# Patient Record
Sex: Female | Born: 1935 | Race: White | Hispanic: No | Marital: Married | Smoking: Never smoker
Health system: Southern US, Community
[De-identification: ages and names within clinical notes are randomized; demographics above are authoritative.]

## PROBLEM LIST (undated history)

## (undated) DIAGNOSIS — K648 Other hemorrhoids: Secondary | ICD-10-CM

## (undated) DIAGNOSIS — F32A Depression, unspecified: Secondary | ICD-10-CM

## (undated) DIAGNOSIS — K5790 Diverticulosis of intestine, part unspecified, without perforation or abscess without bleeding: Secondary | ICD-10-CM

## (undated) DIAGNOSIS — I639 Cerebral infarction, unspecified: Secondary | ICD-10-CM

## (undated) DIAGNOSIS — G4700 Insomnia, unspecified: Secondary | ICD-10-CM

## (undated) DIAGNOSIS — I10 Essential (primary) hypertension: Secondary | ICD-10-CM

## (undated) DIAGNOSIS — F419 Anxiety disorder, unspecified: Secondary | ICD-10-CM

## (undated) DIAGNOSIS — I38 Endocarditis, valve unspecified: Secondary | ICD-10-CM

## (undated) DIAGNOSIS — F329 Major depressive disorder, single episode, unspecified: Secondary | ICD-10-CM

## (undated) DIAGNOSIS — I76 Septic arterial embolism: Secondary | ICD-10-CM

## (undated) DIAGNOSIS — M1990 Unspecified osteoarthritis, unspecified site: Secondary | ICD-10-CM

## (undated) DIAGNOSIS — N289 Disorder of kidney and ureter, unspecified: Secondary | ICD-10-CM

## (undated) DIAGNOSIS — I2510 Atherosclerotic heart disease of native coronary artery without angina pectoris: Secondary | ICD-10-CM

## (undated) DIAGNOSIS — Z972 Presence of dental prosthetic device (complete) (partial): Secondary | ICD-10-CM

## (undated) DIAGNOSIS — M539 Dorsopathy, unspecified: Secondary | ICD-10-CM

## (undated) DIAGNOSIS — T7840XA Allergy, unspecified, initial encounter: Secondary | ICD-10-CM

## (undated) DIAGNOSIS — E785 Hyperlipidemia, unspecified: Secondary | ICD-10-CM

## (undated) DIAGNOSIS — E876 Hypokalemia: Secondary | ICD-10-CM

## (undated) DIAGNOSIS — R42 Dizziness and giddiness: Secondary | ICD-10-CM

## (undated) HISTORY — PX: OTHER SURGICAL HISTORY: SHX169

## (undated) HISTORY — DX: Depression, unspecified: F32.A

## (undated) HISTORY — DX: Diverticulosis of intestine, part unspecified, without perforation or abscess without bleeding: K57.90

## (undated) HISTORY — DX: Hypokalemia: E87.6

## (undated) HISTORY — DX: Allergy, unspecified, initial encounter: T78.40XA

## (undated) HISTORY — DX: Septic arterial embolism: I76

## (undated) HISTORY — DX: Dorsopathy, unspecified: M53.9

## (undated) HISTORY — DX: Unspecified osteoarthritis, unspecified site: M19.90

## (undated) HISTORY — DX: Major depressive disorder, single episode, unspecified: F32.9

## (undated) HISTORY — DX: Cerebral infarction, unspecified: I63.9

## (undated) HISTORY — DX: Dizziness and giddiness: R42

## (undated) HISTORY — DX: Anxiety disorder, unspecified: F41.9

## (undated) HISTORY — DX: Insomnia, unspecified: G47.00

## (undated) HISTORY — DX: Other hemorrhoids: K64.8

## (undated) HISTORY — DX: Disorder of kidney and ureter, unspecified: N28.9

## (undated) HISTORY — DX: Endocarditis, valve unspecified: I38

---

## 1977-02-19 HISTORY — PX: ABDOMINAL HYSTERECTOMY: SHX81

## 1999-09-29 ENCOUNTER — Encounter (INDEPENDENT_AMBULATORY_CARE_PROVIDER_SITE_OTHER): Payer: Self-pay | Admitting: Specialist

## 1999-09-29 ENCOUNTER — Observation Stay (HOSPITAL_COMMUNITY): Admission: RE | Admit: 1999-09-29 | Discharge: 1999-09-30 | Payer: Self-pay | Admitting: General Surgery

## 1999-09-29 HISTORY — PX: CHOLECYSTECTOMY: SHX55

## 2000-06-07 ENCOUNTER — Other Ambulatory Visit: Admission: RE | Admit: 2000-06-07 | Discharge: 2000-06-07 | Payer: Self-pay | Admitting: Obstetrics and Gynecology

## 2002-10-05 ENCOUNTER — Ambulatory Visit (HOSPITAL_COMMUNITY): Admission: RE | Admit: 2002-10-05 | Discharge: 2002-10-05 | Payer: Self-pay | Admitting: Gastroenterology

## 2008-02-05 ENCOUNTER — Encounter (INDEPENDENT_AMBULATORY_CARE_PROVIDER_SITE_OTHER): Payer: Self-pay | Admitting: Gastroenterology

## 2008-02-05 ENCOUNTER — Ambulatory Visit (HOSPITAL_COMMUNITY): Admission: RE | Admit: 2008-02-05 | Discharge: 2008-02-05 | Payer: Self-pay | Admitting: Gastroenterology

## 2010-07-04 NOTE — Op Note (Signed)
NAMEADNA, NOFZIGER NO.:  [id number]   MEDICAL RECORD NO.:  [id number]          PATIENT TYPE:  AMB   LOCATION:  ENDO                         FACILITY:  [facility]   PHYSICIAN:  Petra Kuba, M.D.    DATE OF BIRTH:  [birth year]   DATE OF PROCEDURE:  02/05/2008  DATE OF DISCHARGE:                               OPERATIVE REPORT   PROCEDURE:  Colonoscopy with biopsy.   INDICATIONS:  A patient with a family history of colon cancer due for  repeat screening.  Consent was signed after risks, benefits, methods,  options thoroughly discussed multiple times in the past.   MEDICINES USED PER ANESTHESIA:  Propofol 200 mg, fentanyl 100 mcg,  Versed 2 mg.   PROCEDURE IN DETAIL:  Rectal inspection is pertinent for external  hemorrhoids small digital exam was negative.  The video pediatric  colonoscope was inserted and with moderate difficulty due to a tortuous  sigmoid requiring abdominal pressure once through this area the rest of  the procedure went smoothly with just a little bit of abdominal pressure  was able to advance to the cecum.  On insertion a few small sigmoid  diverticula were seen but no other abnormalities.  Cecum was identified  by the appendiceal orifice and the ileocecal valve.  The scope was  inserted a short ways in the terminal ileum which was normal and photo  documentation was obtained.  Scope was slowly withdrawn.  Prep was  adequate.  There was some liquid stool that required washing and  suction.  On slow withdrawal through the colon at the proximal level of  the hepatic flexure a tiny to small probably hyperplastic appearing  polyp was seen and was cold biopsied x3.  Also in the mid transverse a  similar polyp was seen and cold biopsied x2 and put in the same  container.  The scope was withdrawn around the left side of the colon.  In the mid descending another small polyp was seen, again seemed  hyperplastic appearing.  Cold biopsies were obtained and  it was put in a  separate container.  Scope was further withdrawn, which confirmed the  sigmoid diverticula few and small as well as very tortuous sigmoid but  no other abnormalities.  Once back in the rectum anorectal pull-through  and retroflexion confirmed some small hemorrhoids.  Scope was  straightened and readvanced a short ways up the left side of the colon.  Air was suctioned, scope removed.  The patient tolerated the procedure  well.  There was no obvious immediate complication.   ENDOSCOPIC DIAGNOSES:  1. Internal-external small hemorrhoids.  2. Tortuous sigmoid with a few small diverticula.  3. Descending, transverse and hepatic flexures tiny to small polyps      cold biopsied, all hyperplastic appearing.  4. Otherwise within normal limits to the terminal ileum.   PLAN:  Will await pathology.  Probably recheck colon screening in 5  years.  Happy to see back p.r.n.  If she allows colon screening again  would use similar medicines.  ______________________________  Petra Kuba, M.D.     MEM/MEDQ  D:  02/05/2008  T:  02/06/2008  Job:  [id number]   cc:   Dr Earlene Plater  at [facility] in [place]

## 2010-07-07 NOTE — Op Note (Signed)
[place]  Patient:    Frances Mendoza, Frances Mendoza                    MRN: [id number] Proc. Date: 09/29/99 Adm. Date:  [id number] Attending:  Brandy Hale CC:         Dr. Darrel Hoover. [place], [facility], [facility]   Operative Report  PREOPERATIVE DIAGNOSIS:  Chronic cholecystitis with cholelithiasis.  POSTOPERATIVE DIAGNOSIS:  Chronic cholecystitis with cholelithiasis.  OPERATION PERFORMED:  Laparoscopic cholecystectomy.  SURGEON:  Angelia Mould. Derrell Lolling, M.D.  FIRST ASSISTANT:  Dr. Chevis Pretty.  OPERATIVE INDICATIONS:  This is a 75 year old white female, who has a 77-month history of intermittent episodes of severe epigastric pain radiating to the back.  These attacks of pain are exacerbated by eating.  Gallbladder ultrasound shows multiple gallstones.  Common bile duct measures 5 mm.  Liver function tests and serum amylase are normal.  She has been seen by a cardiologist and cleared for surgery.  She is brought to the operating electively for cholecystectomy.  OPERATIVE FINDINGS:  The gallbladder was somewhat large, filled with numerous gallstones and there was extensive adhesions to the gallbladder, suggesting prior inflammatory episodes.  The anatomy of the cystic duct, cystic artery and common bile duct were conventional.  The liver, stomach and duodenum appeared healthy.  The large intestine and small intestine were grossly normal to inspection.  The peritoneal surface looked normal.  OPERATIVE TECHNIQUE:  Following the induction of general endotracheal anesthesia, the patients abdomen was prepped and draped in a sterile fashion. Marcaine 0.5% with epinephrine was used as a local infiltration anesthetic. A transverse incision was made at the superior rim of the umbilicus.  The fascia was incised in the midline and the abdominal cavity entered under direct vision.  A 10 mm Hasson trocar was inserted and secured with a purse-string  suture of 0 Vicryl.  Pneumoperitoneum was created.  Video camera was inserted with visualization and findings as described above.  A 10 mm trocar was placed in the subxiphoid region and two 5 mm trocars placed in the right mid abdomen.  The gallbladder was identified and elevated.  Numerous adhesions were taken down carefully.  The anatomy was clearly identified.  We were ultimately able to retract the infundibulum of the gallbladder laterally to the right and dissect out the cystic duct and cystic artery and to create a window lateral to the liver and inferior to the gallbladder and superior to the cystic duct demonstrating all of the anatomy.  We clearly identified the junction of the cystic duct with the infundibulum of the gallbladder and clearly identified the junction of the cystic duct with the common bile duct. The cystic duct and cystic artery were then secured with metal clips and divided.  The gallbladder was then dissected from its bed with electrocautery. That dissection was uneventful.  The gallbladder was removed through the umbilical port.  The operative field was copiously irrigated with saline.  At the completion of the case, there was no bleeding and no bile leak whatsoever. The trocars were removed under direct vision and there was no bleeding from the trocar sites.  The pneumoperitoneum was released.  The fascia of the umbilicus was closed with 0 Vicryl sutures.  The skin incisions were closed with subcuticular sutures of 4-0 Vicryl and Steri-Strips.  Clean bandages were placed and the patient taken to the recovery room in stable condition. Estimated blood loss was about 10 cc.  Complications none.  Sponge, needle and instrument counts were correct. DD:  09/29/99 TD:  09/29/99 Job: [id number]

## 2010-07-07 NOTE — Op Note (Signed)
   NAME:  Frances Mendoza, Frances Mendoza                       ACCOUNT NO.:  [id number]   MEDICAL RECORD NO.:  [id number]                   PATIENT TYPE:  AMB   LOCATION:  ENDO                                 FACILITY:  [facility]   PHYSICIAN:  Petra Kuba, M.D.                 DATE OF BIRTH:  June 13, 1935   DATE OF PROCEDURE:  10/05/2002  DATE OF DISCHARGE:                                 OPERATIVE REPORT   PROCEDURE:  Colonoscopy.   INDICATION:  The patient with a family history of colon cancer due for  colonic screening.  Consent was sighed after risks, benefits, methods, and  options were thoroughly discussed in the past.   MEDICINES USED:  Demerol 90, Versed 9.   DESCRIPTION OF PROCEDURE:  Rectal inspection was pertinent for external  hemorrhoids.  Digital exam was negative.  Pediatric video adjustable  colonoscope was inserted and with some difficulty due to a tortuous sigmoid,  once rolling her onto her back and getting the loop out, we were able to  advance to the cecum fairly easily.  However, the initial sigmoid  advancement was difficult.  On insertion, some left-sided diverticula were  seen.  The cecum was identified by the appendical orifice and the ileocecal  valve.  No other abnormality was seen on insertion.  The prep was adequate.  There was some liquid stool that required washing and suctioning.  On slow  withdrawal through the colon, cecum, ascending, and transverse were normal.  As we advanced around the left side, the occasional diverticula were  confirmed but no polypoid lesions, masses, or other abnormalities were seen.  Anorectal pull-through in retroflexion in the rectum confirmed some small  hemorrhoids.  The scope was straightened and readvanced a short ways up the  left side of the colon.  Air was suctioned.  The scope was removed.  The  patient tolerated the procedure adequately.  There was no obvious immediate  complication.   ENDOSCOPIC DIAGNOSES:  1. Internal and  external hemorrhoids.  2. Tortuous sigmoid.  3. Left occasional diverticula.  4. Otherwise within normal limits to the cecum.   PLAN:  Happy to see back p.r.n.  Consider repeat screening in 5-10 years.  Might even consider virtual colonoscopy if available at that junction.  Otherwise, return care to the primary team for customary health care  maintenance to include yearly rectals and guaiacs.                                               Petra Kuba, M.D.    MEM/MEDQ  D:  10/05/2002  T:  10/05/2002  Job:  [id number]   cc:   Ennis Forts  [facility]

## 2010-10-07 ENCOUNTER — Encounter (HOSPITAL_COMMUNITY): Payer: Self-pay | Admitting: Radiology

## 2010-10-07 ENCOUNTER — Emergency Department (HOSPITAL_COMMUNITY): Payer: Medicare Other

## 2010-10-07 ENCOUNTER — Inpatient Hospital Stay (HOSPITAL_COMMUNITY)
Admission: EM | Admit: 2010-10-07 | Discharge: 2010-10-18 | DRG: 280 | Disposition: A | Discharge status: Rehabilitation Facility | Payer: Medicare Other | Attending: Internal Medicine | Admitting: Internal Medicine

## 2010-10-07 DIAGNOSIS — F411 Generalized anxiety disorder: Secondary | ICD-10-CM | POA: Diagnosis present

## 2010-10-07 DIAGNOSIS — R1310 Dysphagia, unspecified: Secondary | ICD-10-CM | POA: Diagnosis present

## 2010-10-07 DIAGNOSIS — I2510 Atherosclerotic heart disease of native coronary artery without angina pectoris: Principal | ICD-10-CM | POA: Diagnosis present

## 2010-10-07 DIAGNOSIS — F0630 Mood disorder due to known physiological condition, unspecified: Secondary | ICD-10-CM | POA: Diagnosis not present

## 2010-10-07 DIAGNOSIS — I050 Rheumatic mitral stenosis: Secondary | ICD-10-CM | POA: Diagnosis present

## 2010-10-07 DIAGNOSIS — I498 Other specified cardiac arrhythmias: Secondary | ICD-10-CM | POA: Diagnosis present

## 2010-10-07 DIAGNOSIS — E876 Hypokalemia: Secondary | ICD-10-CM | POA: Diagnosis present

## 2010-10-07 DIAGNOSIS — R7989 Other specified abnormal findings of blood chemistry: Secondary | ICD-10-CM

## 2010-10-07 DIAGNOSIS — I10 Essential (primary) hypertension: Secondary | ICD-10-CM | POA: Diagnosis present

## 2010-10-07 DIAGNOSIS — I214 Non-ST elevation (NSTEMI) myocardial infarction: Secondary | ICD-10-CM | POA: Diagnosis present

## 2010-10-07 DIAGNOSIS — G0491 Myelitis, unspecified: Secondary | ICD-10-CM | POA: Diagnosis present

## 2010-10-07 DIAGNOSIS — K648 Other hemorrhoids: Secondary | ICD-10-CM | POA: Diagnosis present

## 2010-10-07 DIAGNOSIS — I2690 Septic pulmonary embolism without acute cor pulmonale: Secondary | ICD-10-CM | POA: Diagnosis present

## 2010-10-07 DIAGNOSIS — R4182 Altered mental status, unspecified: Secondary | ICD-10-CM | POA: Diagnosis present

## 2010-10-07 DIAGNOSIS — J189 Pneumonia, unspecified organism: Secondary | ICD-10-CM | POA: Diagnosis not present

## 2010-10-07 DIAGNOSIS — R42 Dizziness and giddiness: Secondary | ICD-10-CM | POA: Diagnosis present

## 2010-10-07 DIAGNOSIS — IMO0002 Reserved for concepts with insufficient information to code with codable children: Secondary | ICD-10-CM | POA: Diagnosis not present

## 2010-10-07 DIAGNOSIS — E46 Unspecified protein-calorie malnutrition: Secondary | ICD-10-CM | POA: Diagnosis present

## 2010-10-07 DIAGNOSIS — I059 Rheumatic mitral valve disease, unspecified: Secondary | ICD-10-CM

## 2010-10-07 DIAGNOSIS — D72829 Elevated white blood cell count, unspecified: Secondary | ICD-10-CM | POA: Diagnosis present

## 2010-10-07 DIAGNOSIS — A419 Sepsis, unspecified organism: Secondary | ICD-10-CM | POA: Diagnosis present

## 2010-10-07 DIAGNOSIS — J9600 Acute respiratory failure, unspecified whether with hypoxia or hypercapnia: Secondary | ICD-10-CM | POA: Diagnosis not present

## 2010-10-07 DIAGNOSIS — R404 Transient alteration of awareness: Secondary | ICD-10-CM

## 2010-10-07 DIAGNOSIS — M1990 Unspecified osteoarthritis, unspecified site: Secondary | ICD-10-CM | POA: Diagnosis present

## 2010-10-07 DIAGNOSIS — Z6832 Body mass index (BMI) 32.0-32.9, adult: Secondary | ICD-10-CM

## 2010-10-07 DIAGNOSIS — R6521 Severe sepsis with septic shock: Secondary | ICD-10-CM | POA: Diagnosis present

## 2010-10-07 DIAGNOSIS — G0490 Encephalitis and encephalomyelitis, unspecified: Secondary | ICD-10-CM | POA: Diagnosis present

## 2010-10-07 DIAGNOSIS — K5730 Diverticulosis of large intestine without perforation or abscess without bleeding: Secondary | ICD-10-CM | POA: Diagnosis present

## 2010-10-07 HISTORY — DX: Essential (primary) hypertension: I10

## 2010-10-07 LAB — URINALYSIS, ROUTINE W REFLEX MICROSCOPIC
Bilirubin Urine: NEGATIVE
Glucose, UA: NEGATIVE mg/dL
Ketones, ur: 15 mg/dL — AB
Nitrite: NEGATIVE
Protein, ur: 30 mg/dL — AB
Specific Gravity, Urine: 1.016 (ref 1.005–1.030)
Urobilinogen, UA: 0.2 mg/dL (ref 0.0–1.0)

## 2010-10-07 LAB — DIFFERENTIAL
Basophils Absolute: 0 10*3/uL (ref 0.0–0.1)
Eosinophils Absolute: 0 10*3/uL (ref 0.0–0.7)
Eosinophils Relative: 0 % (ref 0–5)
Lymphocytes Relative: 9 % — ABNORMAL LOW (ref 12–46)

## 2010-10-07 LAB — BASIC METABOLIC PANEL
BUN: 10 mg/dL (ref 6–23)
CO2: 30 mEq/L (ref 19–32)
CO2: 28 mEq/L (ref 19–32)
Chloride: 97 mEq/L (ref 96–112)
Glucose, Bld: 138 mg/dL — ABNORMAL HIGH (ref 70–99)
Glucose, Bld: 119 mg/dL — ABNORMAL HIGH (ref 70–99)
Potassium: 2.8 mEq/L — ABNORMAL LOW (ref 3.5–5.1)
Potassium: 2.6 mEq/L — CL (ref 3.5–5.1)
Sodium: 135 mEq/L (ref 135–145)

## 2010-10-07 LAB — GLUCOSE, CAPILLARY: Glucose-Capillary: 121 mg/dL — ABNORMAL HIGH (ref 70–99)

## 2010-10-07 LAB — CK TOTAL AND CKMB (NOT AT ARMC)
CK, MB: 15.1 ng/mL (ref 0.3–4.0)
Total CK: 150 U/L (ref 7–177)
Total CK: 186 U/L — ABNORMAL HIGH (ref 7–177)

## 2010-10-07 LAB — CBC
Platelets: 310 10*3/uL (ref 150–400)
RDW: 13.1 % (ref 11.5–15.5)
WBC: 15.5 10*3/uL — ABNORMAL HIGH (ref 4.0–10.5)

## 2010-10-07 LAB — TROPONIN I: Troponin I: 3.44 ng/mL (ref <0.30)

## 2010-10-07 LAB — POCT I-STAT TROPONIN I: Troponin i, poc: 2.15 ng/mL (ref 0.00–0.08)

## 2010-10-07 LAB — HEPARIN LEVEL (UNFRACTIONATED): Heparin Unfractionated: <0.1 IU/mL — ABNORMAL LOW (ref 0.30–0.70)

## 2010-10-07 MED ORDER — IOHEXOL 300 MG/ML  SOLN
100.0000 mL | Freq: Once | INTRAMUSCULAR | Status: AC | PRN
  Administered 2010-10-07: 100 mL via INTRAVENOUS

## 2010-10-07 NOTE — Consult Note (Signed)
NAME:  Frances Mendoza, Frances Mendoza NO.:  [id number]  MEDICAL RECORD NO.:  [id number]  LOCATION:  [facility]                         FACILITY:  [facility]  PHYSICIAN:  Isidor Holts, M.D.  DATE OF BIRTH:  06-Oct-1935  DATE OF CONSULTATION:  10/07/2010 DATE OF DISCHARGE:                                CONSULTATION   PRIMARY MD:  Dr. Earlene Plater, [facility] medical practice, [place], [place].  REQUESTING MD:  Dr. Elease Hashimoto, cardiologist.  REASON FOR CONSULTATION:  Altered mental status/elevated white cell count.  HISTORY OF PRESENT ILLNESS:  This is a 75 year old female.  History is obtained from the patient's spouse and daughter, who were present in the emergency department, as the patient is quite confused and disoriented. Apparently, she was quite well until 7 a.m. on October 07, 2010 when she woke up complaining of feeling unwell, was nauseated and vomited a few times with only small amount of bilious liquid. She heaved a lot, however, and complained of headache.  She also appear quite disoriented. Denied abdominal pain, diarrhea, or antecedent respiratory tract symptoms or fever or chills.  She subsequently, developed substernal chest pain.  Her spouse at about 10 a.m, called her son who is an EMT and he called emergency medical services, who then arrived and brought her to the emergency department.  She was evaluated by the Cardiology team, was diagnosed with NSTEMI.  We are, however, requested to consult for altered mental status and leukocytosis.  PAST MEDICAL HISTORY: 1. Diverticulosis. 2. Internal hemorrhoids. 3. Status post colonoscopy in February 2009 which demonstrated     multiple polyps which were biopsied at this time. 4. Status post laparoscopic cholecystectomy on September 29, 1999 for     chronic cholecystitis/cholelithiasis. 5. Hypertension. 6. Anxiety. 7. Vertigo. 8. Osteoarthritis.  ALLERGIES:  These are multiple and include CODEINE, PENICILLIN, PROCARDIA,  and PROLACTIN.  MEDICATION HISTORY:  The patient is on; 1. Chlorthalidone. 2. Diazepam 3. Meloxicam. 4. Premarin.  Note, precise dosages are not known at this time and this medication list will be updated by clinical pharmacologist, as further information is obtained.  REVIEW OF SYSTEMS:  As per HPI and chief complaint, otherwise negative.  SOCIAL HISTORY:  The patient is married.  She is retired.  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  She has three offspring.  She is a nonsmoker, nondrinker.  Has no history of drug abuse.  FAMILY HISTORY:  The patient's father died status post MI at age 15 years.  All her sisters are heart patient.  Her mother died status post CVA at age 69 years.  PHYSICAL EXAMINATION:  VITAL SIGNS:  Temperature 98.9, pulse 106 per minute and regular, respiratory rate 14, BP 170/87 mmHg, rechecked 169/92 mmHg after the nitroglycerin given the emergency department. Pulse oximetry 96% on 2 liters of oxygen. GENERAL:  The patient did not seem to be in obvious acute distress at the time of this evaluation.  She was somewhat somnolent, even after  Flumazenil, administered by ED MD,  but easily arousable.   Follows instructions albeit at some urging, not communicative. HEENT:  No clinical pallor or jaundice.  No  conjunctival injection. Hydration status appears fair. NECK:  Supple.  JVP not seen. CHEST:  Clear to auscultation.  No wheezes or crackles. HEART:  Sounds are heard and normal, mildly tachycardic, regular, no murmurs. ABDOMEN:  Morbidly obese, soft, nontender.  Unable to palpate organs. Bowel sounds are normal. LOWER EXTREMITY:  No pitting edema.  Palpable peripheral pulses. MUSCULOSKELETAL SYSTEM:  The patient does have osteoarthritic changes. CENTRAL NERVOUS SYSTEM:  The patient is moving all, however, further examination is not feasible secondary to lack of cooperation.  INVESTIGATIONS:  CBC, WBC 15.5,  neutrophils 88%, hemoglobin 14.8, hematocrit 42.0, platelets 310.  Electrolytes, sodium 135, potassium 2.8, chloride 93, CO2 of 30, BUN 10, creatinine less than 0.47, glucose 138, troponin-I at point of care 2.17 and regular troponin-I 1.20.  CBG 121.  Urinalysis is negative.  Chest x-ray October 07, 2010, shows no active disease.  Head CT scan October 07, 2010, is negative for acute pathology.  ASSESSMENT AND PLAN AND RECOMMENDATIONS: 1. Altered mental status.  This appears to be delirium and possibly of     toxic/metabolic etiology.  The patient has no obvious focus of     infection at the present time.  Chest x-ray is negative.     Urinalysis is negative, hydration status appears fair.  The patient is     afebrile.  However, she does have a neutrophil leukocytosis, which     although it may possibly be due to NSTEMI, is concerning for     infective etiology.  We shall therefore send blood cultures and if     the patient spikes a temperature, we will initiate Broad-spectrum antibiotic     therapy.  In view of altered mental status, we shall arrange a     brain MRI to rule out possible CVA, although there is a paucity of     focal findings at this time, neurologically.  2. Hypokalemia.  The patient has a significant hypokalemia, likely     secondary to vomiting.  She has no arrhythmia on telemetric     monitoring.  This will be repleted as appropriate.  As a matter of     fact, the cardiology team has already addressed this.  3. Vomiting.  Query secondary to acute gastritis versus acute MI.  We     shall utilize p.r.n. antiemetics, proton pump inhibitor, and for     completeness check lipase to rule out acute pancreatitis.  4. Non-ST elevation myocardial infarction, this will be managed by the     primary team.  5. Hypertension.  This is uncontrolled and quite significant.  The     primary team is likely already addressing this,     however, the patient may benefit from beta-blocker  treatment     provided no contraindications exist, otherwise, ACE inhibitor,     or hydralazine may be utilized.  Thank you for the consultation.  We will follow with you.     Isidor Holts, M.D.     CO/MEDQ  D:  10/07/2010  T:  10/07/2010  Job:  [id number]  cc:   Vesta Mixer, M.D. Dr. Earlene Plater.  Electronically Signed by Isidor Holts M.D. on 10/07/2010 08:52:15 PM

## 2010-10-08 ENCOUNTER — Inpatient Hospital Stay (HOSPITAL_COMMUNITY): Payer: Medicare Other

## 2010-10-08 DIAGNOSIS — R4182 Altered mental status, unspecified: Secondary | ICD-10-CM

## 2010-10-08 DIAGNOSIS — J9600 Acute respiratory failure, unspecified whether with hypoxia or hypercapnia: Secondary | ICD-10-CM

## 2010-10-08 DIAGNOSIS — G0490 Encephalitis and encephalomyelitis, unspecified: Secondary | ICD-10-CM

## 2010-10-08 DIAGNOSIS — G0491 Myelitis, unspecified: Secondary | ICD-10-CM

## 2010-10-08 DIAGNOSIS — A419 Sepsis, unspecified organism: Secondary | ICD-10-CM

## 2010-10-08 DIAGNOSIS — R579 Shock, unspecified: Secondary | ICD-10-CM

## 2010-10-08 LAB — BASIC METABOLIC PANEL
CO2: 25 mEq/L (ref 19–32)
Chloride: 92 mEq/L — ABNORMAL LOW (ref 96–112)
Sodium: 125 mEq/L — ABNORMAL LOW (ref 135–145)

## 2010-10-08 LAB — BASIC METABOLIC PANEL WITH GFR
BUN: 7 mg/dL (ref 6–23)
CO2: 26 meq/L (ref 19–32)
Calcium: 7.9 mg/dL — ABNORMAL LOW (ref 8.4–10.5)
Chloride: 93 meq/L — ABNORMAL LOW (ref 96–112)
Creatinine, Ser: <0.47 mg/dL — ABNORMAL LOW (ref 0.50–1.10)
Glucose, Bld: 145 mg/dL — ABNORMAL HIGH (ref 70–99)
Potassium: 2.6 meq/L — CL (ref 3.5–5.1)
Sodium: 130 meq/L — ABNORMAL LOW (ref 135–145)

## 2010-10-08 LAB — CBC
HCT: 33.8 % — ABNORMAL LOW (ref 36.0–46.0)
Hemoglobin: 11.8 g/dL — ABNORMAL LOW (ref 12.0–15.0)
MCH: 31.4 pg (ref 26.0–34.0)
MCHC: 34.9 g/dL (ref 30.0–36.0)
MCV: 89.9 fL (ref 78.0–100.0)
Platelets: 259 10*3/uL (ref 150–400)
RBC: 3.76 MIL/uL — ABNORMAL LOW (ref 3.87–5.11)
RDW: 13 % (ref 11.5–15.5)
WBC: 14.5 10*3/uL — ABNORMAL HIGH (ref 4.0–10.5)

## 2010-10-08 LAB — HEPARIN LEVEL (UNFRACTIONATED): Heparin Unfractionated: <0.1 IU/mL — ABNORMAL LOW (ref 0.30–0.70)

## 2010-10-08 LAB — URINE DRUGS OF ABUSE SCREEN W ALC, ROUTINE (REF LAB)
Amphetamine Screen, Ur: NEGATIVE
Barbiturate Quant, Ur: NEGATIVE
Benzodiazepines.: POSITIVE — AB
Cocaine Metabolites: NEGATIVE
Creatinine,U: 44.5 mg/dL
Ethyl Alcohol: <10 mg/dL
Marijuana Metabolite: NEGATIVE
Methadone: NEGATIVE
Opiate Screen, Urine: NEGATIVE
Phencyclidine (PCP): NEGATIVE
Propoxyphene: NEGATIVE

## 2010-10-08 LAB — DIFFERENTIAL
Basophils Relative: 0 % (ref 0–1)
Eosinophils Absolute: 0 10*3/uL (ref 0.0–0.7)
Lymphs Abs: 3.3 10*3/uL (ref 0.7–4.0)
Monocytes Absolute: 1.3 10*3/uL — ABNORMAL HIGH (ref 0.1–1.0)
Monocytes Relative: 9 % (ref 3–12)
Neutro Abs: 9.9 10*3/uL — ABNORMAL HIGH (ref 1.7–7.7)

## 2010-10-08 LAB — CK TOTAL AND CKMB (NOT AT ARMC)
CK, MB: 9 ng/mL (ref 0.3–4.0)
Relative Index: 3.2 — ABNORMAL HIGH (ref 0.0–2.5)
Total CK: 279 U/L — ABNORMAL HIGH (ref 7–177)

## 2010-10-08 LAB — MRSA PCR SCREENING: MRSA by PCR: NEGATIVE

## 2010-10-08 LAB — PHOSPHORUS
Phosphorus: 1.3 mg/dL — ABNORMAL LOW (ref 2.3–4.6)
Phosphorus: 1.2 mg/dL — ABNORMAL LOW (ref 2.3–4.6)

## 2010-10-08 LAB — TROPONIN I: Troponin I: 2.52 ng/mL (ref <0.30)

## 2010-10-08 LAB — POCT I-STAT 3, ART BLOOD GAS (G3+)
Acid-Base Excess: 1 mmol/L (ref 0.0–2.0)
Bicarbonate: 25.3 mEq/L — ABNORMAL HIGH (ref 20.0–24.0)
TCO2: 26 mmol/L (ref 0–100)

## 2010-10-08 LAB — MAGNESIUM
Magnesium: 1.5 mg/dL (ref 1.5–2.5)
Magnesium: 2 mg/dL (ref 1.5–2.5)

## 2010-10-08 LAB — URINE CULTURE

## 2010-10-08 MED ORDER — GADOBENATE DIMEGLUMINE 529 MG/ML IV SOLN
15.0000 mL | Freq: Once | INTRAVENOUS | Status: AC | PRN
  Administered 2010-10-08: 15 mL via INTRAVENOUS

## 2010-10-09 ENCOUNTER — Inpatient Hospital Stay (HOSPITAL_COMMUNITY): Payer: Medicare Other

## 2010-10-09 DIAGNOSIS — G0490 Encephalitis and encephalomyelitis, unspecified: Secondary | ICD-10-CM

## 2010-10-09 DIAGNOSIS — G0491 Myelitis, unspecified: Secondary | ICD-10-CM

## 2010-10-09 LAB — BASIC METABOLIC PANEL
BUN: 6 mg/dL (ref 6–23)
Calcium: 7.3 mg/dL — ABNORMAL LOW (ref 8.4–10.5)
Chloride: 93 mEq/L — ABNORMAL LOW (ref 96–112)
Chloride: 102 mEq/L (ref 96–112)
Creatinine, Ser: <0.47 mg/dL — ABNORMAL LOW (ref 0.50–1.10)
Potassium: 3.2 mEq/L — ABNORMAL LOW (ref 3.5–5.1)
Sodium: 126 mEq/L — ABNORMAL LOW (ref 135–145)

## 2010-10-09 LAB — ROCKY MTN SPOTTED FVR AB, IGM-BLOOD: RMSF IgM: 0.35 IV (ref 0.00–0.89)

## 2010-10-09 LAB — MAGNESIUM
Magnesium: 1.8 mg/dL (ref 1.5–2.5)
Magnesium: 2 mg/dL (ref 1.5–2.5)

## 2010-10-09 LAB — POCT I-STAT 3, ART BLOOD GAS (G3+)
Patient temperature: 99.9
pCO2 arterial: 34.9 mmHg — ABNORMAL LOW (ref 35.0–45.0)
pO2, Arterial: 89 mmHg (ref 80.0–100.0)

## 2010-10-09 LAB — CBC
HCT: 29.2 % — ABNORMAL LOW (ref 36.0–46.0)
RDW: 13.1 % (ref 11.5–15.5)
WBC: 10.1 10*3/uL (ref 4.0–10.5)

## 2010-10-09 LAB — B. BURGDORFI ANTIBODIES: B burgdorferi Ab IgG+IgM: 0.12 {ISR}

## 2010-10-09 LAB — PHOSPHORUS: Phosphorus: 2 mg/dL — ABNORMAL LOW (ref 2.3–4.6)

## 2010-10-10 ENCOUNTER — Inpatient Hospital Stay (HOSPITAL_COMMUNITY): Payer: Medicare Other

## 2010-10-10 DIAGNOSIS — R579 Shock, unspecified: Secondary | ICD-10-CM

## 2010-10-10 DIAGNOSIS — A419 Sepsis, unspecified organism: Secondary | ICD-10-CM

## 2010-10-10 DIAGNOSIS — I059 Rheumatic mitral valve disease, unspecified: Secondary | ICD-10-CM

## 2010-10-10 DIAGNOSIS — I339 Acute and subacute endocarditis, unspecified: Secondary | ICD-10-CM

## 2010-10-10 DIAGNOSIS — J9600 Acute respiratory failure, unspecified whether with hypoxia or hypercapnia: Secondary | ICD-10-CM

## 2010-10-10 HISTORY — PX: TEE WITHOUT CARDIOVERSION: SHX5443

## 2010-10-10 LAB — BASIC METABOLIC PANEL
BUN: 6 mg/dL (ref 6–23)
CO2: 28 mEq/L (ref 19–32)
CO2: 26 mEq/L (ref 19–32)
Calcium: 7.5 mg/dL — ABNORMAL LOW (ref 8.4–10.5)
Calcium: 7.6 mg/dL — ABNORMAL LOW (ref 8.4–10.5)
Creatinine, Ser: <0.47 mg/dL — ABNORMAL LOW (ref 0.50–1.10)
GFR calc non Af Amer: >60 mL/min (ref >60)
Sodium: 139 mEq/L (ref 135–145)

## 2010-10-10 LAB — BLOOD GAS, ARTERIAL
Bicarbonate: 25.2 mEq/L — ABNORMAL HIGH (ref 20.0–24.0)
TCO2: 26.3 mmol/L (ref 0–100)
pCO2 arterial: 36.1 mmHg (ref 35.0–45.0)
pH, Arterial: 7.458 — ABNORMAL HIGH (ref 7.350–7.400)
pO2, Arterial: 77.8 mmHg — ABNORMAL LOW (ref 80.0–100.0)

## 2010-10-10 LAB — URINALYSIS, ROUTINE W REFLEX MICROSCOPIC
Nitrite: NEGATIVE
Specific Gravity, Urine: 1.014 (ref 1.005–1.030)
pH: 5.5 (ref 5.0–8.0)

## 2010-10-10 LAB — BODY FLUID CELL COUNT WITH DIFFERENTIAL
Eos, Fluid: 2 %
Monocyte-Macrophage-Serous Fluid: 9 % — ABNORMAL LOW (ref 50–90)
Other Cells, Fluid: 0 %

## 2010-10-10 LAB — GLUCOSE, CAPILLARY: Glucose-Capillary: 112 mg/dL — ABNORMAL HIGH (ref 70–99)

## 2010-10-10 LAB — CBC
MCH: 31.1 pg (ref 26.0–34.0)
Platelets: 214 10*3/uL (ref 150–400)
RBC: 3.22 MIL/uL — ABNORMAL LOW (ref 3.87–5.11)

## 2010-10-10 LAB — MAGNESIUM
Magnesium: 2.2 mg/dL (ref 1.5–2.5)
Magnesium: 2.1 mg/dL (ref 1.5–2.5)

## 2010-10-10 LAB — PHOSPHORUS: Phosphorus: 2.3 mg/dL (ref 2.3–4.6)

## 2010-10-10 LAB — URINE MICROSCOPIC-ADD ON

## 2010-10-11 ENCOUNTER — Inpatient Hospital Stay (HOSPITAL_COMMUNITY): Payer: Medicare Other

## 2010-10-11 LAB — BLOOD GAS, ARTERIAL
Bicarbonate: 24.3 mEq/L — ABNORMAL HIGH (ref 20.0–24.0)
Drawn by: 30599
PEEP: 5 cmH2O
PEEP: 5 cmH2O
Patient temperature: 98.6
Pressure support: 5 cmH2O
TCO2: 25.5 mmol/L (ref 0–100)
pCO2 arterial: 38.3 mmHg (ref 35.0–45.0)
pCO2 arterial: 36.4 mmHg (ref 35.0–45.0)
pH, Arterial: 7.419 — ABNORMAL HIGH (ref 7.350–7.400)
pH, Arterial: 7.447 — ABNORMAL HIGH (ref 7.350–7.400)
pO2, Arterial: 77.8 mmHg — ABNORMAL LOW (ref 80.0–100.0)
pO2, Arterial: 73 mmHg — ABNORMAL LOW (ref 80.0–100.0)

## 2010-10-11 LAB — BASIC METABOLIC PANEL
CO2: 25 mEq/L (ref 19–32)
Calcium: 6.9 mg/dL — ABNORMAL LOW (ref 8.4–10.5)
Creatinine, Ser: <0.47 mg/dL — ABNORMAL LOW (ref 0.50–1.10)
Sodium: 137 mEq/L (ref 135–145)

## 2010-10-11 LAB — CBC
MCH: 31.3 pg (ref 26.0–34.0)
MCHC: 33.6 g/dL (ref 30.0–36.0)
MCV: 93.3 fL (ref 78.0–100.0)
Platelets: 241 10*3/uL (ref 150–400)
RBC: 3.29 MIL/uL — ABNORMAL LOW (ref 3.87–5.11)
RDW: 13.5 % (ref 11.5–15.5)

## 2010-10-11 LAB — BENZODIAZEPINE, QUANTITATIVE, URINE
Flurazepam GC/MS Conf: NEGATIVE NG/ML
Nordiazepam GC/MS Conf: NEGATIVE NG/ML
Oxazepam GC/MS Conf: 362 NG/ML — ABNORMAL HIGH
Temazepam GC/MS Conf: 316 NG/ML — ABNORMAL HIGH

## 2010-10-11 LAB — MAGNESIUM: Magnesium: 1.9 mg/dL (ref 1.5–2.5)

## 2010-10-11 LAB — GLUCOSE, CAPILLARY
Glucose-Capillary: 142 mg/dL — ABNORMAL HIGH (ref 70–99)
Glucose-Capillary: 150 mg/dL — ABNORMAL HIGH (ref 70–99)
Glucose-Capillary: 139 mg/dL — ABNORMAL HIGH (ref 70–99)
Glucose-Capillary: 161 mg/dL — ABNORMAL HIGH (ref 70–99)

## 2010-10-11 LAB — PATHOLOGIST SMEAR REVIEW

## 2010-10-11 LAB — PHOSPHORUS: Phosphorus: 1.8 mg/dL — ABNORMAL LOW (ref 2.3–4.6)

## 2010-10-12 ENCOUNTER — Inpatient Hospital Stay (HOSPITAL_COMMUNITY): Payer: Medicare Other

## 2010-10-12 DIAGNOSIS — A419 Sepsis, unspecified organism: Secondary | ICD-10-CM

## 2010-10-12 DIAGNOSIS — J9600 Acute respiratory failure, unspecified whether with hypoxia or hypercapnia: Secondary | ICD-10-CM

## 2010-10-12 DIAGNOSIS — R579 Shock, unspecified: Secondary | ICD-10-CM

## 2010-10-12 LAB — CBC
Hemoglobin: 12.1 g/dL (ref 12.0–15.0)
MCH: 30.9 pg (ref 26.0–34.0)
MCV: 92.1 fL (ref 78.0–100.0)
RBC: 3.91 MIL/uL (ref 3.87–5.11)
WBC: 11.9 10*3/uL — ABNORMAL HIGH (ref 4.0–10.5)

## 2010-10-12 LAB — BASIC METABOLIC PANEL
CO2: 31 mEq/L (ref 19–32)
Calcium: 9.8 mg/dL (ref 8.4–10.5)
Chloride: 101 mEq/L (ref 96–112)
Creatinine, Ser: <0.47 mg/dL — ABNORMAL LOW (ref 0.50–1.10)
Glucose, Bld: 129 mg/dL — ABNORMAL HIGH (ref 70–99)
Sodium: 139 mEq/L (ref 135–145)

## 2010-10-12 LAB — GLUCOSE, CAPILLARY
Glucose-Capillary: 117 mg/dL — ABNORMAL HIGH (ref 70–99)
Glucose-Capillary: 139 mg/dL — ABNORMAL HIGH (ref 70–99)
Glucose-Capillary: 155 mg/dL — ABNORMAL HIGH (ref 70–99)

## 2010-10-12 LAB — CULTURE, BAL-QUANTITATIVE W GRAM STAIN: Colony Count: 100

## 2010-10-12 LAB — PHOSPHORUS: Phosphorus: 3.3 mg/dL (ref 2.3–4.6)

## 2010-10-12 LAB — MAGNESIUM: Magnesium: 2.2 mg/dL (ref 1.5–2.5)

## 2010-10-13 ENCOUNTER — Inpatient Hospital Stay (HOSPITAL_COMMUNITY): Payer: Medicare Other

## 2010-10-13 DIAGNOSIS — G0491 Myelitis, unspecified: Secondary | ICD-10-CM

## 2010-10-13 DIAGNOSIS — G0490 Encephalitis and encephalomyelitis, unspecified: Secondary | ICD-10-CM

## 2010-10-13 LAB — GLUCOSE, CAPILLARY
Glucose-Capillary: 153 mg/dL — ABNORMAL HIGH (ref 70–99)
Glucose-Capillary: 135 mg/dL — ABNORMAL HIGH (ref 70–99)
Glucose-Capillary: 116 mg/dL — ABNORMAL HIGH (ref 70–99)

## 2010-10-13 LAB — CBC
HCT: 39.5 % (ref 36.0–46.0)
Hemoglobin: 13.6 g/dL (ref 12.0–15.0)
MCHC: 34.4 g/dL (ref 30.0–36.0)
RBC: 4.35 MIL/uL (ref 3.87–5.11)

## 2010-10-13 LAB — CULTURE, BLOOD (ROUTINE X 2)
Culture  Setup Time: 201208182017
Culture: NO GROWTH

## 2010-10-13 NOTE — Consult Note (Signed)
NAMEMarland Kitchen  Frances Mendoza, Frances Mendoza NO.:  [id number]  MEDICAL RECORD NO.:  [id number]  LOCATION:  2309                         FACILITY:  [facility]  PHYSICIAN:  Sheliah Plane, MD    DATE OF BIRTH:  1935-03-28  DATE OF CONSULTATION:  10/10/2010 DATE OF DISCHARGE:                                CONSULTATION   REQUESTING PHYSICIAN:  Dr. Molli Knock.  REASON FOR CONSULTATION:  Question suitability for mitral valve repair replacement.  HISTORY OF PRESENT ILLNESS:  The patient is a 75 year old female who has had several weeks of increasing fatigue and weakness but then on October 07, 2010, was brought to the emergency room after acute onset of confusion, nausea, and vomiting.  She was noted to have positive cardiac enzymes.  Previously, has a history of hypertension.  Since admission, she has been on a ventilator with evidence of embolic stroke.  A TEE was performed today with some death and calcification of the mitral annulus, thickening of the posterior mitral leaflet with very mild MR.  There is no definite mobile vegetations.  She is currently now hemodynamically stable, sedated on ventilator.  PAST MEDICAL HISTORY:  Significant for history of hypertension.  Previous surgery include cholecystectomy, hysterectomy, and "bladder tuck."  SOCIAL AND FAMILY HISTORY:  The patient does have positive family history of cardiac disease.  She does not smoke and does not drink any alcohol.  Review of systems is difficult to obtain because the patient is sedated. Through her family, it appears that other than fatigue for the past several weeks, she has not complained of any fever, chills, night sweats, change in bowel habits, blood in her stool or urine.  She does work in the yard, was infested with tics.  Current medications at the time of admission include: 1. Chlorthalidone 25 mg a day. 2. Valium 2.5 p.r.n. 3. Premarin 0.625. 4. Meloxicam 7.5 mg.  PHYSICAL EXAMINATION:  GENERAL:   The patient opens her eyes and weakly moves all extremities, left side being slightly weaker than the right. VITAL SIGNS:  Heart rate is 89 and sinus, blood pressure 95/46, O2 sats 96%.  She has no carotid bruits. LUNGS:  Clear. CARDIAC:  Reveals regular rhythm.  Previous exam, has had noticed a systolic murmur radiating to axilla.  Currently, I do not appreciate this. ABDOMINAL:  Benign without palpable masses. EXTREMITIES:  Lower extremities with mild edema, both in her hands and her feet.  Laboratory findings are reviewed.  So far she has negative blood cultures.  As noted the transthoracic and transesophageal echoes are reviewed with Dr. Shirlee Latch.  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  There is no obvious significant size mobile vegetation on the mitral leaflets.  There is some thickening and calcification in the base of the posterior leaflet.  With these findings, I would continue to treat her with resumption of endocarditis but would not recommend proceeding with a mitral valve repair or replacement at this time.  I would be glad to continue to follow with you.     Sheliah Plane, MD     EG/MEDQ  D:  10/10/2010  T:  10/11/2010  Job:  [id number]  Electronically Signed by Sheliah Plane MD on 10/13/2010 07:41:35 AM

## 2010-10-14 ENCOUNTER — Inpatient Hospital Stay (HOSPITAL_COMMUNITY): Payer: Medicare Other

## 2010-10-14 LAB — BASIC METABOLIC PANEL
BUN: 16 mg/dL (ref 6–23)
CO2: 32 mEq/L (ref 19–32)
Chloride: 97 mEq/L (ref 96–112)
Chloride: 97 mEq/L (ref 96–112)
Glucose, Bld: 173 mg/dL — ABNORMAL HIGH (ref 70–99)
Potassium: 3.7 mEq/L (ref 3.5–5.1)
Potassium: 3.3 mEq/L — ABNORMAL LOW (ref 3.5–5.1)
Sodium: 137 mEq/L (ref 135–145)
Sodium: 138 mEq/L (ref 135–145)

## 2010-10-14 LAB — CBC
MCV: 90 fL (ref 78.0–100.0)
Platelets: 381 10*3/uL (ref 150–400)
RBC: 4.41 MIL/uL (ref 3.87–5.11)
WBC: 11 10*3/uL — ABNORMAL HIGH (ref 4.0–10.5)

## 2010-10-14 LAB — GLUCOSE, CAPILLARY
Glucose-Capillary: 129 mg/dL — ABNORMAL HIGH (ref 70–99)
Glucose-Capillary: 108 mg/dL — ABNORMAL HIGH (ref 70–99)

## 2010-10-14 LAB — MAGNESIUM: Magnesium: 2.2 mg/dL (ref 1.5–2.5)

## 2010-10-14 LAB — VANCOMYCIN, TROUGH: Vancomycin Tr: 10.4 ug/mL (ref 10.0–20.0)

## 2010-10-14 LAB — PHOSPHORUS: Phosphorus: 4 mg/dL (ref 2.3–4.6)

## 2010-10-14 NOTE — Consult Note (Signed)
NAME:  Frances Mendoza, Frances Mendoza NO.:  [id number]  MEDICAL RECORD NO.:  [id number]  LOCATION:                                 FACILITY:  PHYSICIAN:  Thana Farr, MD    DATE OF BIRTH:  1935-11-03  DATE OF CONSULTATION:  10/08/2010 DATE OF DISCHARGE:                                CONSULTATION   CONSULT CALLED BY:  Vesta Mixer, MD  HISTORY:  Frances Mendoza is a 75 year old female who has had some increased lethargy over the past 2-3 months.  Awakened yesterday extremely lethargic.  Has some nausea, vomiting, and confusion.  The patient was brought in for evaluation, and there was some question as to whether this may be related to medications.  The patient was given a reversal agent for her diazepam that she takes at home.  She became extremely agitated and confused.  There has been no improvement in her agitation and confusion and consult was called today for further recommendations.  With workup, the patient has been found to rule in for an MI.  She is on heparin.  The patient has also had a CT of the brain that was essentially unremarkable.  PAST MEDICAL HISTORY: 1. Diverticulosis. 2. Internal hemorrhoids. 3. Hypertension. 4. Anxiety. 5. Vertigo. 6. Osteoarthritis.  SOCIAL HISTORY:  The patient is married.  She is retired.  She has no history of alcohol, tobacco, or illicit drug abuse.  MEDICATIONS AT HOME:  Chlorthalidone, diazepam, meloxicam, and Premarin.  PHYSICAL EXAMINATION:  VITAL SIGNS:  Blood pressure 119/57, heart rate 97, respiratory rate 18, T-max 99.5. NEUROLOGIC:  On mental status testing, the patient is awake and agitated.  She keeps her eyes closed.  She does not follow commands. She does respond in short sentences but the words are a bit slurred.  On cranial nerve testing:  II - unable to test.  III, IV, VI - pupils reactive.  Doll's eyes maneuvers are intact.  V and VII - corneals are intact bilaterally.  No facial droop is noted.  VIII  grossly intact. IX, X, XI, and XII unable to test.  On motor testing, the patient moves all extremities strongly although formal testing cannot be performed. On sensory testing, the patient appreciates noxious stimuli in all extremities.  Deep tendon reflexes are difficult to elicit secondary to agitation, but plantars are mute bilaterally.  Cerebellar testing, unable to perform.  LABORATORY DATA:  Sodium of 130, potassium 2.6, chloride 93, bicarb 26, BUN 7, creatinine 0.47, glucose 147, calcium 7.9, magnesium 1.5. Troponin 2.52.  TSH 1.952.  White blood cell count 14.5, platelet count 259, hemoglobin and hematocrit 11.8 and 33.8 respectively.  ASSESSMENT:  Frances Mendoza is a 75 year old female, who was previously healthy, who presents with acute mental status changes.  Etiology unclear.  The patient has a low-grade temp and increased white blood cell count to suggest infection.  Cultures are pending.  She also has incurred multiple tick bites as well.  Although meningitis is in her differential, we are unable to do an lumbar puncture secondary to herneed for heparin.  The patient now is covered with broad-spectrum antibiotics that include an antiviral as well.  Head CT was unremarkable but further imaging most likely is necessary.  The patient has multiple metabolic abnormalities, although most of them do not seem significant enough to cause this degree of mental status changes.  We would like to do a full battery of blood work to determine if any other abnormalities are present that may be more significant.  PLAN: 1. Continue antibiotics and antivirals. 2. EEG in the morning. 3. MRI of the brain. 4. Laboratory testing to include liver functions, albumin, lyme titer, ESR once     these are able to be obtained.          ______________________________ Thana Farr, MD     LR/MEDQ  D:  10/08/2010  T:  10/08/2010  Job:  [id number]  Electronically Signed by Thana Farr MD on  10/14/2010 09:56:41 PM

## 2010-10-15 LAB — CBC
Hemoglobin: 12.2 g/dL (ref 12.0–15.0)
MCHC: 33.4 g/dL (ref 30.0–36.0)
Platelets: 353 10*3/uL (ref 150–400)
RBC: 4.01 MIL/uL (ref 3.87–5.11)

## 2010-10-15 LAB — COMPREHENSIVE METABOLIC PANEL
AST: 16 U/L (ref 0–37)
Albumin: 3.2 g/dL — ABNORMAL LOW (ref 3.5–5.2)
BUN: 21 mg/dL (ref 6–23)
Chloride: 98 mEq/L (ref 96–112)
Creatinine, Ser: 0.54 mg/dL (ref 0.50–1.10)
Total Bilirubin: 0.5 mg/dL (ref 0.3–1.2)
Total Protein: 6.6 g/dL (ref 6.0–8.3)

## 2010-10-15 LAB — PHOSPHORUS: Phosphorus: 4.4 mg/dL (ref 2.3–4.6)

## 2010-10-15 LAB — PROTIME-INR
INR: 0.98 (ref 0.00–1.49)
Prothrombin Time: 13.2 seconds (ref 11.6–15.2)

## 2010-10-15 LAB — MAGNESIUM: Magnesium: 2.2 mg/dL (ref 1.5–2.5)

## 2010-10-16 ENCOUNTER — Inpatient Hospital Stay (HOSPITAL_COMMUNITY): Payer: Medicare Other

## 2010-10-16 LAB — CULTURE, BLOOD (ROUTINE X 2): Culture  Setup Time: 201208211605

## 2010-10-16 LAB — CBC
HCT: 34.9 % — ABNORMAL LOW (ref 36.0–46.0)
Hemoglobin: 11.4 g/dL — ABNORMAL LOW (ref 12.0–15.0)
MCHC: 32.7 g/dL (ref 30.0–36.0)
RBC: 3.78 MIL/uL — ABNORMAL LOW (ref 3.87–5.11)
WBC: 9.9 10*3/uL (ref 4.0–10.5)

## 2010-10-16 LAB — GENTAMICIN LEVEL, TROUGH: Gentamicin Trough: 0.4 ug/mL — ABNORMAL LOW (ref 0.5–2.0)

## 2010-10-16 LAB — BASIC METABOLIC PANEL
BUN: 18 mg/dL (ref 6–23)
CO2: 32 mEq/L (ref 19–32)
Chloride: 102 mEq/L (ref 96–112)
GFR calc non Af Amer: >60 mL/min (ref >60)
Glucose, Bld: 95 mg/dL (ref 70–99)
Potassium: 3.3 mEq/L — ABNORMAL LOW (ref 3.5–5.1)
Sodium: 140 mEq/L (ref 135–145)

## 2010-10-16 LAB — GLUCOSE, CAPILLARY
Glucose-Capillary: 102 mg/dL — ABNORMAL HIGH (ref 70–99)
Glucose-Capillary: 81 mg/dL (ref 70–99)

## 2010-10-17 DIAGNOSIS — I38 Endocarditis, valve unspecified: Secondary | ICD-10-CM

## 2010-10-17 DIAGNOSIS — I6340 Cerebral infarction due to embolism of unspecified cerebral artery: Secondary | ICD-10-CM

## 2010-10-17 LAB — LUPUS ANTICOAGULANT PANEL
DRVVT: 40.8 secs (ref 34.1–42.2)
Lupus Anticoagulant: NOT DETECTED

## 2010-10-17 LAB — CBC
HCT: 35.6 % — ABNORMAL LOW (ref 36.0–46.0)
MCH: 30.7 pg (ref 26.0–34.0)
MCV: 92 fL (ref 78.0–100.0)
Platelets: 325 10*3/uL (ref 150–400)
RBC: 3.87 MIL/uL (ref 3.87–5.11)

## 2010-10-17 LAB — PROTEIN S ACTIVITY: Protein S Activity: 95 % (ref 69–129)

## 2010-10-17 LAB — VANCOMYCIN, TROUGH: Vancomycin Tr: 20.6 ug/mL — ABNORMAL HIGH (ref 10.0–20.0)

## 2010-10-17 LAB — GLUCOSE, CAPILLARY: Glucose-Capillary: 129 mg/dL — ABNORMAL HIGH (ref 70–99)

## 2010-10-17 LAB — PROTHROMBIN GENE MUTATION

## 2010-10-18 ENCOUNTER — Inpatient Hospital Stay (HOSPITAL_COMMUNITY)
Admission: RE | Admit: 2010-10-18 | Discharge: 2010-11-06 | DRG: 945 | Disposition: A | Discharge status: Home Health | Payer: Medicare Other | Source: Other Acute Inpatient Hospital | Attending: Physical Medicine & Rehabilitation | Admitting: Physical Medicine & Rehabilitation

## 2010-10-18 DIAGNOSIS — F411 Generalized anxiety disorder: Secondary | ICD-10-CM

## 2010-10-18 DIAGNOSIS — I214 Non-ST elevation (NSTEMI) myocardial infarction: Secondary | ICD-10-CM

## 2010-10-18 DIAGNOSIS — I330 Acute and subacute infective endocarditis: Secondary | ICD-10-CM

## 2010-10-18 DIAGNOSIS — B3731 Acute candidiasis of vulva and vagina: Secondary | ICD-10-CM

## 2010-10-18 DIAGNOSIS — Z79899 Other long term (current) drug therapy: Secondary | ICD-10-CM

## 2010-10-18 DIAGNOSIS — R6510 Systemic inflammatory response syndrome (SIRS) of non-infectious origin without acute organ dysfunction: Secondary | ICD-10-CM | POA: Insufficient documentation

## 2010-10-18 DIAGNOSIS — Z5189 Encounter for other specified aftercare: Principal | ICD-10-CM

## 2010-10-18 DIAGNOSIS — B370 Candidal stomatitis: Secondary | ICD-10-CM

## 2010-10-18 DIAGNOSIS — Z7982 Long term (current) use of aspirin: Secondary | ICD-10-CM

## 2010-10-18 DIAGNOSIS — Y9210 Unspecified residential institution as the place of occurrence of the external cause: Secondary | ICD-10-CM

## 2010-10-18 DIAGNOSIS — B373 Candidiasis of vulva and vagina: Secondary | ICD-10-CM

## 2010-10-18 DIAGNOSIS — J189 Pneumonia, unspecified organism: Secondary | ICD-10-CM

## 2010-10-18 DIAGNOSIS — I2510 Atherosclerotic heart disease of native coronary artery without angina pectoris: Secondary | ICD-10-CM | POA: Insufficient documentation

## 2010-10-18 DIAGNOSIS — N179 Acute kidney failure, unspecified: Secondary | ICD-10-CM

## 2010-10-18 DIAGNOSIS — N289 Disorder of kidney and ureter, unspecified: Secondary | ICD-10-CM

## 2010-10-18 DIAGNOSIS — I10 Essential (primary) hypertension: Secondary | ICD-10-CM

## 2010-10-18 DIAGNOSIS — Z860100 Personal history of colon polyps, unspecified: Secondary | ICD-10-CM

## 2010-10-18 DIAGNOSIS — Z8249 Family history of ischemic heart disease and other diseases of the circulatory system: Secondary | ICD-10-CM

## 2010-10-18 DIAGNOSIS — R1310 Dysphagia, unspecified: Secondary | ICD-10-CM

## 2010-10-18 DIAGNOSIS — I6340 Cerebral infarction due to embolism of unspecified cerebral artery: Secondary | ICD-10-CM

## 2010-10-18 DIAGNOSIS — L270 Generalized skin eruption due to drugs and medicaments taken internally: Secondary | ICD-10-CM

## 2010-10-18 DIAGNOSIS — T368X5A Adverse effect of other systemic antibiotics, initial encounter: Secondary | ICD-10-CM

## 2010-10-18 DIAGNOSIS — I38 Endocarditis, valve unspecified: Secondary | ICD-10-CM

## 2010-10-18 DIAGNOSIS — E876 Hypokalemia: Secondary | ICD-10-CM

## 2010-10-18 DIAGNOSIS — I639 Cerebral infarction, unspecified: Secondary | ICD-10-CM

## 2010-10-18 DIAGNOSIS — I2690 Septic pulmonary embolism without acute cor pulmonale: Secondary | ICD-10-CM

## 2010-10-18 DIAGNOSIS — Z880 Allergy status to penicillin: Secondary | ICD-10-CM

## 2010-10-18 DIAGNOSIS — R5381 Other malaise: Secondary | ICD-10-CM

## 2010-10-18 DIAGNOSIS — R197 Diarrhea, unspecified: Secondary | ICD-10-CM

## 2010-10-18 DIAGNOSIS — G4700 Insomnia, unspecified: Secondary | ICD-10-CM

## 2010-10-18 DIAGNOSIS — I340 Nonrheumatic mitral (valve) insufficiency: Secondary | ICD-10-CM | POA: Insufficient documentation

## 2010-10-18 DIAGNOSIS — I76 Septic arterial embolism: Secondary | ICD-10-CM

## 2010-10-18 DIAGNOSIS — K5730 Diverticulosis of large intestine without perforation or abscess without bleeding: Secondary | ICD-10-CM

## 2010-10-18 HISTORY — DX: Septic arterial embolism: I76

## 2010-10-18 HISTORY — DX: Endocarditis, valve unspecified: I38

## 2010-10-18 HISTORY — DX: Cerebral infarction, unspecified: I63.9

## 2010-10-18 LAB — BETA-2-GLYCOPROTEIN I ABS, IGG/M/A
Beta-2 Glyco I IgG: 0 G Units (ref <20)
Beta-2-Glycoprotein I IgM: 10 M Units (ref <20)

## 2010-10-18 LAB — COMPREHENSIVE METABOLIC PANEL
ALT: 8 U/L (ref 0–35)
AST: 16 U/L (ref 0–37)
Albumin: 3.1 g/dL — ABNORMAL LOW (ref 3.5–5.2)
Alkaline Phosphatase: 95 U/L (ref 39–117)
Calcium: 9.5 mg/dL (ref 8.4–10.5)
Potassium: 3.3 mEq/L — ABNORMAL LOW (ref 3.5–5.1)
Sodium: 137 mEq/L (ref 135–145)
Total Protein: 6.8 g/dL (ref 6.0–8.3)

## 2010-10-18 LAB — CBC
Hemoglobin: 11.5 g/dL — ABNORMAL LOW (ref 12.0–15.0)
MCH: 30.5 pg (ref 26.0–34.0)
Platelets: 287 10*3/uL (ref 150–400)
RBC: 3.77 MIL/uL — ABNORMAL LOW (ref 3.87–5.11)
WBC: 12.9 10*3/uL — ABNORMAL HIGH (ref 4.0–10.5)

## 2010-10-18 LAB — Q FEVER ABS IGG, IGM W/ REFLEX TITER

## 2010-10-18 LAB — URINALYSIS, ROUTINE W REFLEX MICROSCOPIC
Bilirubin Urine: NEGATIVE
Ketones, ur: NEGATIVE mg/dL
Nitrite: NEGATIVE
Specific Gravity, Urine: 1.018 (ref 1.005–1.030)
Urobilinogen, UA: 1 mg/dL (ref 0.0–1.0)
pH: 7 (ref 5.0–8.0)

## 2010-10-18 LAB — GLUCOSE, CAPILLARY
Glucose-Capillary: 125 mg/dL — ABNORMAL HIGH (ref 70–99)
Glucose-Capillary: 141 mg/dL — ABNORMAL HIGH (ref 70–99)
Glucose-Capillary: 121 mg/dL — ABNORMAL HIGH (ref 70–99)

## 2010-10-18 LAB — PROTIME-INR
INR: 1.08 (ref 0.00–1.49)
Prothrombin Time: 14.2 seconds (ref 11.6–15.2)

## 2010-10-18 LAB — URINE MICROSCOPIC-ADD ON

## 2010-10-18 LAB — CARDIOLIPIN ANTIBODIES, IGG, IGM, IGA
Anticardiolipin IgA: 6 APL U/mL — ABNORMAL LOW (ref <22)
Anticardiolipin IgM: 8 MPL U/mL — ABNORMAL LOW (ref <11)

## 2010-10-18 LAB — BARTONELLA ANTIBODY PANEL: B henselae IgG: NEGATIVE

## 2010-10-19 ENCOUNTER — Inpatient Hospital Stay (HOSPITAL_COMMUNITY): Payer: Medicare Other

## 2010-10-19 DIAGNOSIS — R5381 Other malaise: Secondary | ICD-10-CM

## 2010-10-19 DIAGNOSIS — I6340 Cerebral infarction due to embolism of unspecified cerebral artery: Secondary | ICD-10-CM

## 2010-10-19 LAB — CBC
HCT: 35.5 % — ABNORMAL LOW (ref 36.0–46.0)
Hemoglobin: 11.6 g/dL — ABNORMAL LOW (ref 12.0–15.0)
MCH: 30.3 pg (ref 26.0–34.0)
MCHC: 32.7 g/dL (ref 30.0–36.0)
MCV: 92.7 fL (ref 78.0–100.0)
Platelets: 309 10*3/uL (ref 150–400)
RBC: 3.83 MIL/uL — ABNORMAL LOW (ref 3.87–5.11)
RDW: 13.6 % (ref 11.5–15.5)
WBC: 13.3 10*3/uL — ABNORMAL HIGH (ref 4.0–10.5)

## 2010-10-19 LAB — DIFFERENTIAL
Basophils Absolute: 0 10*3/uL (ref 0.0–0.1)
Basophils Relative: 0 % (ref 0–1)
Eosinophils Absolute: 0.1 10*3/uL (ref 0.0–0.7)
Eosinophils Relative: 1 % (ref 0–5)
Lymphocytes Relative: 19 % (ref 12–46)
Lymphs Abs: 2.5 10*3/uL (ref 0.7–4.0)
Monocytes Absolute: 1.6 10*3/uL — ABNORMAL HIGH (ref 0.1–1.0)
Monocytes Relative: 12 % (ref 3–12)
Neutro Abs: 9.1 10*3/uL — ABNORMAL HIGH (ref 1.7–7.7)
Neutrophils Relative %: 68 % (ref 43–77)

## 2010-10-19 LAB — GLUCOSE, CAPILLARY
Glucose-Capillary: 148 mg/dL — ABNORMAL HIGH (ref 70–99)
Glucose-Capillary: 113 mg/dL — ABNORMAL HIGH (ref 70–99)

## 2010-10-19 LAB — COMPREHENSIVE METABOLIC PANEL
ALT: 7 U/L (ref 0–35)
AST: 13 U/L (ref 0–37)
Albumin: 3 g/dL — ABNORMAL LOW (ref 3.5–5.2)
Alkaline Phosphatase: 90 U/L (ref 39–117)
Potassium: 3.7 mEq/L (ref 3.5–5.1)
Sodium: 136 mEq/L (ref 135–145)
Total Protein: 6.9 g/dL (ref 6.0–8.3)

## 2010-10-19 LAB — URINE CULTURE: Colony Count: NO GROWTH

## 2010-10-19 LAB — PROTEIN C, TOTAL: Protein C, Total: 137 % (ref 72–160)

## 2010-10-19 LAB — HEMOGLOBIN A1C: Hgb A1c MFr Bld: 5.6 % (ref <5.7)

## 2010-10-19 LAB — PROTEIN S, TOTAL: Protein S Ag, Total: 176 % — ABNORMAL HIGH (ref 60–150)

## 2010-10-20 ENCOUNTER — Inpatient Hospital Stay (HOSPITAL_COMMUNITY): Payer: Medicare Other

## 2010-10-20 ENCOUNTER — Encounter (HOSPITAL_COMMUNITY): Payer: Self-pay | Admitting: Radiology

## 2010-10-20 DIAGNOSIS — I38 Endocarditis, valve unspecified: Secondary | ICD-10-CM

## 2010-10-20 DIAGNOSIS — I6340 Cerebral infarction due to embolism of unspecified cerebral artery: Secondary | ICD-10-CM

## 2010-10-20 DIAGNOSIS — R5381 Other malaise: Secondary | ICD-10-CM

## 2010-10-20 LAB — GLUCOSE, CAPILLARY
Glucose-Capillary: 112 mg/dL — ABNORMAL HIGH (ref 70–99)
Glucose-Capillary: 130 mg/dL — ABNORMAL HIGH (ref 70–99)

## 2010-10-20 LAB — VANCOMYCIN, TROUGH: Vancomycin Tr: 23.6 ug/mL — ABNORMAL HIGH (ref 10.0–20.0)

## 2010-10-20 MED ORDER — IOHEXOL 300 MG/ML  SOLN
100.0000 mL | Freq: Once | INTRAMUSCULAR | Status: AC | PRN
  Administered 2010-10-20: 100 mL via INTRAVENOUS

## 2010-10-21 DIAGNOSIS — R5381 Other malaise: Secondary | ICD-10-CM

## 2010-10-21 DIAGNOSIS — I38 Endocarditis, valve unspecified: Secondary | ICD-10-CM

## 2010-10-21 DIAGNOSIS — I6340 Cerebral infarction due to embolism of unspecified cerebral artery: Secondary | ICD-10-CM

## 2010-10-21 LAB — GLUCOSE, CAPILLARY: Glucose-Capillary: 111 mg/dL — ABNORMAL HIGH (ref 70–99)

## 2010-10-21 LAB — BASIC METABOLIC PANEL
BUN: 18 mg/dL (ref 6–23)
Calcium: 9.6 mg/dL (ref 8.4–10.5)
GFR calc non Af Amer: 58 mL/min — ABNORMAL LOW (ref >60)
Glucose, Bld: 132 mg/dL — ABNORMAL HIGH (ref 70–99)
Potassium: 3.3 mEq/L — ABNORMAL LOW (ref 3.5–5.1)

## 2010-10-22 LAB — DIFFERENTIAL
Basophils Absolute: 0 10*3/uL (ref 0.0–0.1)
Eosinophils Relative: 7 % — ABNORMAL HIGH (ref 0–5)
Lymphocytes Relative: 22 % (ref 12–46)

## 2010-10-22 LAB — CBC
HCT: 32.1 % — ABNORMAL LOW (ref 36.0–46.0)
Platelets: 322 10*3/uL (ref 150–400)
RDW: 13.5 % (ref 11.5–15.5)
WBC: 7.9 10*3/uL (ref 4.0–10.5)

## 2010-10-22 LAB — GLUCOSE, CAPILLARY
Glucose-Capillary: 110 mg/dL — ABNORMAL HIGH (ref 70–99)
Glucose-Capillary: 109 mg/dL — ABNORMAL HIGH (ref 70–99)

## 2010-10-22 LAB — CLOSTRIDIUM DIFFICILE BY PCR: Toxigenic C. Difficile by PCR: NEGATIVE

## 2010-10-23 LAB — GLUCOSE, CAPILLARY
Glucose-Capillary: 125 mg/dL — ABNORMAL HIGH (ref 70–99)
Glucose-Capillary: 102 mg/dL — ABNORMAL HIGH (ref 70–99)

## 2010-10-23 LAB — CBC
HCT: 31.9 % — ABNORMAL LOW (ref 36.0–46.0)
Hemoglobin: 10.8 g/dL — ABNORMAL LOW (ref 12.0–15.0)
MCV: 90.9 fL (ref 78.0–100.0)
RBC: 3.51 MIL/uL — ABNORMAL LOW (ref 3.87–5.11)
WBC: 7.6 10*3/uL (ref 4.0–10.5)

## 2010-10-24 DIAGNOSIS — I6340 Cerebral infarction due to embolism of unspecified cerebral artery: Secondary | ICD-10-CM

## 2010-10-24 DIAGNOSIS — I38 Endocarditis, valve unspecified: Secondary | ICD-10-CM

## 2010-10-24 DIAGNOSIS — R5381 Other malaise: Secondary | ICD-10-CM

## 2010-10-24 LAB — GLUCOSE, CAPILLARY: Glucose-Capillary: 109 mg/dL — ABNORMAL HIGH (ref 70–99)

## 2010-10-24 LAB — VANCOMYCIN, TROUGH: Vancomycin Tr: 19.1 ug/mL (ref 10.0–20.0)

## 2010-10-25 LAB — CULTURE, BLOOD (ROUTINE X 2)
Culture  Setup Time: 201208301229
Culture: NO GROWTH

## 2010-10-25 LAB — GLUCOSE, CAPILLARY
Glucose-Capillary: 120 mg/dL — ABNORMAL HIGH (ref 70–99)
Glucose-Capillary: 110 mg/dL — ABNORMAL HIGH (ref 70–99)

## 2010-10-26 DIAGNOSIS — I38 Endocarditis, valve unspecified: Secondary | ICD-10-CM

## 2010-10-26 DIAGNOSIS — I6340 Cerebral infarction due to embolism of unspecified cerebral artery: Secondary | ICD-10-CM

## 2010-10-26 DIAGNOSIS — R5381 Other malaise: Secondary | ICD-10-CM

## 2010-10-26 LAB — GLUCOSE, CAPILLARY
Glucose-Capillary: 107 mg/dL — ABNORMAL HIGH (ref 70–99)
Glucose-Capillary: 123 mg/dL — ABNORMAL HIGH (ref 70–99)

## 2010-10-26 LAB — CBC
HCT: 33.2 % — ABNORMAL LOW (ref 36.0–46.0)
Hemoglobin: 11.1 g/dL — ABNORMAL LOW (ref 12.0–15.0)
MCH: 30.4 pg (ref 26.0–34.0)
MCHC: 33.4 g/dL (ref 30.0–36.0)

## 2010-10-27 ENCOUNTER — Inpatient Hospital Stay (HOSPITAL_COMMUNITY): Payer: Medicare Other

## 2010-10-27 ENCOUNTER — Other Ambulatory Visit (HOSPITAL_COMMUNITY): Payer: Medicare Other

## 2010-10-27 DIAGNOSIS — R5381 Other malaise: Secondary | ICD-10-CM

## 2010-10-27 DIAGNOSIS — I38 Endocarditis, valve unspecified: Secondary | ICD-10-CM

## 2010-10-27 DIAGNOSIS — I6340 Cerebral infarction due to embolism of unspecified cerebral artery: Secondary | ICD-10-CM

## 2010-10-27 LAB — GLUCOSE, CAPILLARY: Glucose-Capillary: 140 mg/dL — ABNORMAL HIGH (ref 70–99)

## 2010-10-27 LAB — BASIC METABOLIC PANEL
Calcium: 9.7 mg/dL (ref 8.4–10.5)
Creatinine, Ser: 1.08 mg/dL (ref 0.50–1.10)
GFR calc Af Amer: >60 mL/min (ref >60)

## 2010-10-27 LAB — GENTAMICIN LEVEL, TROUGH: Gentamicin Trough: 0.5 ug/mL (ref 0.5–2.0)

## 2010-10-27 NOTE — Discharge Summary (Signed)
Frances Mendoza, Frances Mendoza NO.:  [id number]  MEDICAL RECORD NO.:  [id number]  LOCATION:  2007                         FACILITY:  [facility]  PHYSICIAN:  Pleas Koch, MD        DATE OF BIRTH:  19-Feb-1936  DATE OF ADMISSION:  10/07/2010 DATE OF DISCHARGE:                              DISCHARGE SUMMARY   DIAGNOSES UP-TO-DATE: 1. Non-ST segment elevation myocardial infarction. 2. 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 3. Multiple cerebral emboli likely secondary to endocarditis resulting     in release phenomenon and frontal signs inclusive of impulsivity. 4. Likely post-intensive care unit syndrome. 5. Mild hypoxemia, now resolved. 6. Systemic Inflammatory Response syndrome criteria and confirmed. 7. Dysphagia III and on dysphagia diet. 8. Dyselectrolytemia with an elevated calcium, which is now resolved. 9. Resolved hypokalemia. 10.Agitation and confusion likely secondary to multiple etiologies as     per above first three numbers. 11.No congestive heart failure, but an ejection fraction of 60-65%     with grade 3-4 plaque in the arch and descending thoracic aorta.  Mitral valve showed moderately-to-severely calcified annulus with the relatively small 8 x 9 mm vegetation towards the base of the posterior leaflet.  Medications as of today are as follows: 1. Aspirin 81 mg. 2. Ciprofloxacin 500 q.12 p.o. (start date was October 16, 2010). 3. Ensure 237 mL b.i.d. 4. Fluticasone one puff b.i.d. 5. Gentamicin q.12 hourly, start date October 09, 2010. 6. Haldol 2 mg q.24 hourly p.o. 7. Heparin for prophylaxis.  The patient finished her course of     imipenem on October 16, 2010 per ID's instruction. 8. Lisinopril 5 mg daily. 9. Miconazole completing a 3-day  course on October 15, 2010. 10.Metoprolol 12.5 daily XL. 11.Protonix 40 mg q.12. 12.Vancomycin q.12 as per above.  P.r.n. medications include assessment 650, alprazolam 0.25, diazepam 2.5 q.12, tramadol 50 q.6.  Pertinent imaging studies as follow: 1. CT of head on October 07, 2010 showed negative head CT. 2. Portable chest x-ray initially showed no active cardiopulmonary     disease. 3. CT angiogram of the chest showed: 4. No focal filling defect have seen to suggest pulmonary embolism up     to third-order pulmonary arteries.     a.     Mildly prominent right hilar mediastinal nodes.  These could      be reactive less likely due to malignancy in the office and no      primary diagnosis could Castleman disease. 5. Portable abdominal x-ray on October 08, 2010 showed Panda tube     coiled within fundus ending at the body of stomach. 6. MRI of brain showed multiple areas of restricted motion, large tear     involving the temporal lobe, small areas involving the anterior     left frontal lobe, posterior frontal lobe bilaterally, parietal     lobe and occipital lobe bilaterally moved up left splenium  of     corpus callosum and right basal ganglia.     a.     Tiny area of involvement, mid left cerebellum, this raised      the possibility of embolic disease rather related to shower gland      emboli versus septic emboli.     b.     Involvement of left temporal lobe suggest that herpes      encephalitis could not be completely excluded high clinical      concern; however, involvement of multiple vascular other      territories and more suggestive of embolic disease.     c.     Questionable minimal meningeal enhancement of parenchymal      mass lesion.     d.     Polypoid opacification of right maxillary sinus, mucosal      thickening remained paranasal.     e.     Cervical spondylosis changes with mild stenosis and mild      cord flattening. 7. Swallow function study was done on October 12, 2010. 8. Final chest x-ray on October 16, 2010, showed minimal bibasilar     atelectasis.  Repeat swallow study was done on October 16, 2010 as     well.  Echocardiogram as dictated above.  Pertinent microbiological findings as follows: 1. Urine culture on October 07, 2010 showed no growth, quantitative     bile showed 100 colonies of Candida albicans. 2. Blood culture negative x2 on October 07, 2010. 3. Fungal culture on October 10, 2010 showed pseudohyphae yeast     consistent with Candida species. 4. Blood culture of October 10, 2010, showed no growth x5 days x2.  The patient had further microbiological workup inclusive of Lyme disease, IgG which was 0.12, [facility] spotted fever which was 0.35.  MRSA PCR is negative.  The patient had a hypercoagulable panel done today showing everything negative except for an elevated protein C.  This is a pleasant fully functional 75 year old female who presented to the hospital initially on the Cardiology Service.  She woke up with mental status changes and confusion as she was not making sense.  She denied any dizziness, did not fall, and no syncope and state at one point she may be having chest discomfort.  Urgent workup was done and it was very difficult to get a history from her per Dr. Elease Hashimoto who was the admitting physician.  She was given half ampule of Romazicon and improving slightly and her main complain is that she is feeling sick. She denied any chest pain at present.  On initial exam, it was noted blood pressure 150/90.  JVD was consistent with tricuspid regurgitation and did not appear significantly elevated.  Mucosa was moist.  S1-S2 and questionable mitral valve.  She has had 2/6 murmur radiating to the upper left sternal border consistent with tricuspid regurgitation. General PMI was nondisplaced.  She had no abdominal tenderness. Initially white count was 15.5, hemoglobin 14.8, hematocrit 42.  Sodium 135, potassium 2.8,  chloride 93, CO2 30, BUN 10, creatinine 0.46.  CPK is 150 with her CK-MB of 18.4, troponin is 1.2.  Her EKG revealed sinus tachycardia, inferior Q-waves in V3 and 4 suggestive of an acute old myocardial infarction, old anterior wall MI.  EKG from 2001 revealed a similar pattern.  HOSPITAL COURSE:  The patient was initially admitted under the Cardiac Service and she was started on heparin as thought she had NSTEMI and thought that  she would need heart cath.  It was found ultimately that the patient had criteria compatible with SIRS and the patient was then consulted upon by Dr. Elease Hashimoto by Dr. Isidor Holts who reviewed the patient.  She did have leukocytosis and the patient was therefore started on broad-spectrum antibiotic therapy and blood cultures were sent and brain MRI was done to rule out possible CVA.  Subsequently, the patient was taken over after workup of all the same by critical care during the period of time.  Subsequently, the patient was assessed by Dr. Thana Farr of Neurology and it was thought that she may have incurred tick bites.  Her CT was unremarkable per them, but they recommended an MRI of the brain and an EEG.  PCCM was consulted after being seen and as the patient was unagreeable and to provide cooperate, the patient was intubated for airway protection and for further diagnostic tests included MRI and LP.  LP was not done right away as the patient was on heparin drip and she is to uncooperative at that time.  She had a urinary toxicology that was done.  She was initially broadly covered for everything inclusive of acyclovir for hepatitis and herpes encephalitis.  The patient was kept on Critical Care Service until the 24th of this month where it was ultimately determined that she had had aseptic endocarditis, which caused shower of emboli likely causing bilateral CVAs of the brain.  Cardiology wise, aseptic endocarditis.  The patient has been seen  by Cardiology as well as by Infectious Disease who are currently managing her medications.  She is on the above antibiotics as per Dr. Paulette Blanch Dam.  She will likely need 6 weeks of complete therapy for the same and then probable redraw of blood cultures per Dr. Clinton Gallant instructions.  She is doing well today; however, her antibiotics had been changed yesterday to Cipro, which is day#2 today and she finished her a 7-day course of Primaxin on October 16, 2010 and she is on gentamicin, vancomycin on day#9 and 10 respectively.  Her current issues are that; her white count went from 9.9-15.2 and she spiked a low-grade temperature of 99.4.  Dr. Clinton Gallant note from today states that we should monitor for C. diff colitis and this will be reviewed.  Hypertension.  She has been hypotensive over the past couple days mildly; however, this has not been severe and actually she has now hypertensive in the 140s-150s/63s-90s.  Her blood pressure medications should be reimplanted slowly and cautiously and she is currently on lisinopril 5 which can be uptitrated to 10 and metoprolol succinate XL 12.5 daily.  Tachycardia.  This is currently resolved and it was likely secondary to multiple causes inclusive of withdrawal from aforementioned beta- blocker.  Neurologic.  She has cerebrovascular accidents bilaterally and has had PT/OT work with her who stated that she is impulsive and we will need to be cleared before discharge to CIR safely when medically appropriate, Dr. Thana Farr' and Dr. Marlis Edelson assistance in managing her case are greatly appreciated.  Her hypercoagulable panel is inconclusive for any other causes of the cerebrovascular accident inclusive of hereditary for hereditary or other issues.  Respiratory.  She had good saturations.  Numerous chest x-rays have been done, which showed just mild atelectasis.  She was extubated on October 13, 2010.  Infectious Disease issues.  Please  see above. 1. Aseptic endocarditis.  She grew fungus from the bronchoalveolar     lavage that was done  by Pulmonary and Critical Care on October 10, 2010 and Dr. Daiva Eves feels that this is more a contaminant. 2. Fluids, electrolytes, nutrition.  The patient is somewhat     malnourished and is currently on nectar-thick juice and she had     multiple MBs was done and has been recommended on dysphagia nectar-     thick diet. 3. Psych.  The patient suffers from frontal release issues as well as     memory issues.  She is told numerous family members on numerous     occasions inappropriate things and she keeps insisting to this     examiner that she "knows me from somewhere."  I have never met this     patient, so that would be highly unlikely.  I would probably and     this is the help of Psychiatry if the patient's issues escalate.     She is currently on Haldol and Ativan as per above.  Her mother     died 2 months ago and the patient has had to relate the trauma of     this and social worker has been excellent in giving her supportive     care.  Her family seemed very supportive and likely she will     recover from the same.  There may be an element of ICU psychosis as     the patient was on large doses of sedating medications inclusive of     Haldol prior to me a managing her care. 4. Dyselectrolytemia.  She had a transient elevation of her calcium,     which has resolved as of now.  ISSUES: 1. The patient's most concerning issue is that we will need to be able     to determine what course of antibiotics in duration and Dr. Daiva Eves     will assist [place] with this visit.  His assistance is much     appreciated. 2. The patient will need a disposition to CIR.  CIR is willing to     accept her, but she will need to be medically stable and afebrile     at that time. 3. Psych.  I would consult Dr. Rogers Blocker or equivalent psychiatrist if     there is any further decompensation or escalation  of the patient's     behavior.  I have discussed extensively with numerous family     members on numerous days with plan of care, their understanding of     the same.  Final disposition will be as per [facility] who     will be reviewing her tomorrow.          ______________________________ Pleas Koch, MD     JS/MEDQ  D:  10/17/2010  T:  10/17/2010  Job:  [id number]  cc:   Vesta Mixer, M.D. Felipa Evener, MD Sheliah Plane, MD Carmin Richmond, MD Pramod P. Pearlean Brownie, MD Acey Lav, MD  Electronically Signed by Pleas Koch MD on 10/27/2010 09:45:22 PM

## 2010-10-28 DIAGNOSIS — I38 Endocarditis, valve unspecified: Secondary | ICD-10-CM

## 2010-10-28 DIAGNOSIS — R5381 Other malaise: Secondary | ICD-10-CM

## 2010-10-28 DIAGNOSIS — I6340 Cerebral infarction due to embolism of unspecified cerebral artery: Secondary | ICD-10-CM

## 2010-10-28 LAB — GLUCOSE, CAPILLARY
Glucose-Capillary: 103 mg/dL — ABNORMAL HIGH (ref 70–99)
Glucose-Capillary: 109 mg/dL — ABNORMAL HIGH (ref 70–99)

## 2010-10-29 LAB — GLUCOSE, CAPILLARY: Glucose-Capillary: 119 mg/dL — ABNORMAL HIGH (ref 70–99)

## 2010-10-30 LAB — CBC
HCT: 33.2 % — ABNORMAL LOW (ref 36.0–46.0)
Hemoglobin: 11.3 g/dL — ABNORMAL LOW (ref 12.0–15.0)
WBC: 9.2 10*3/uL (ref 4.0–10.5)

## 2010-10-30 LAB — GLUCOSE, CAPILLARY: Glucose-Capillary: 114 mg/dL — ABNORMAL HIGH (ref 70–99)

## 2010-10-30 LAB — VANCOMYCIN, TROUGH: Vancomycin Tr: 22.9 ug/mL — ABNORMAL HIGH (ref 10.0–20.0)

## 2010-10-30 LAB — BASIC METABOLIC PANEL
BUN: 21 mg/dL (ref 6–23)
Chloride: 101 mEq/L (ref 96–112)
GFR calc Af Amer: 34 mL/min — ABNORMAL LOW (ref >60)
Glucose, Bld: 109 mg/dL — ABNORMAL HIGH (ref 70–99)
Potassium: 3.2 mEq/L — ABNORMAL LOW (ref 3.5–5.1)
Sodium: 139 mEq/L (ref 135–145)

## 2010-10-30 NOTE — H&P (Signed)
NAMEMarland Mendoza  Frances, Mendoza NO.:  [id number]  MEDICAL RECORD NO.:  [id number]  LOCATION:  2309                         FACILITY:  [facility]  PHYSICIAN:  Vesta Mixer, M.D. DATE OF BIRTH:  01/26/1936  DATE OF ADMISSION:  10/07/2010 DATE OF DISCHARGE:                             HISTORY & PHYSICAL   Frances Mendoza is a 75 year old female who is in relatively normal health. She has not had any cardiac problems.  She is admitted to the hospital with confusion, nausea, and vomiting.  She was noted to have positive cardiac enzymes consistent with myocardial infarction and we were asked to see her.  The patient is a relatively healthy 75 year old female.  She has a history of hypertension treated with chlorthalidone.  The patient is normally very active and does not have any significant problems.  She has a history of cholecystectomy in the past.  The patient was normal when she went to bed last night.  This morning she woke up with some mental status changes/confusion.  The family would ask her questions and her answers would not make any sense.  She denies any dizziness.  She did not fall.  There was no syncope or presyncope.  She had some nausea and vomiting and has been vomiting most of the morning. She stated at one point that she may have had some chest discomfort. She was brought to the emergency room.  An urgent head CT was negative for any acute process.  Cardiac enzymes were subsequently positive and we were called to see her.  The patient is very difficult to get a history from.  She still has some mental status changes, although they seem to be improving gradually.  We gave her half an amp of Romazicon and she may be improving slightly. Her main complaint is that she feels sick.  She denies any chest pain at present.  She denies any shortness of breath.  CURRENT MEDICATIONS: 1. Chlorthalidone 25 mg a day. 2. Valium 2.5 mg as needed. 3. Premarin 0.625 mg a  day. 4. Meloxicam 7.5 mg a day.  ALLERGIES:  She has no known drug allergies.  PAST MEDICAL HISTORY: 1. Hypertension. 2. History of cholecystectomy.  SOCIAL HISTORY:  The patient does not smoke and does not drink alcohol.  FAMILY HISTORY:  Positive for cardiac disease.  REVIEW OF SYSTEMS:  As noted in the HPI.  Her family was able to provide some review of systems but the patient was not able to.  They did not report any significant changes from her normal behavior.  All systems were reviewed with the family and were negative except as noted in the HPI.  PHYSICAL EXAMINATION:  GENERAL:  She is an elderly female who is somewhat lethargic.  She was not able to answer questions very clearly. VITAL SIGNS:  Her heart rate is 100.  Her blood pressure is 150/90. HEENT:  Reveals she has JVD consistent with tricuspid regurgitation. Her JVD did not appear to be significantly elevated.  Her mucous membranes were moist.  Her sclerae were nonicteric.  Her carotids were normal without bruits. LUNGS:  Clear. HEART:  Regular rate, S1, S2.  She  has a 2-3/6 systolic murmur radiating to the axilla with a question of a mitral valve click.  She has a 2/6 systolic murmur radiating to the upper left sternal border consistent with tricuspid regurgitation.  Her PMI is nondisplaced.  There is no chest wall tenderness. ABDOMEN:  Reveals good bowel sounds and is nontender. EXTREMITIES:  She has no clubbing, cyanosis, or edema.  LABORATORY DATA:  Reveals a white blood cell count of 15.5.  Her hemoglobin is 14.8, hematocrit is 42%.  Sodium is 135, potassium is 2.8, chloride is 93, CO2 is 30, BUN is 10, creatinine 0.47.  Her CPK is 150 with 18.4 MBs.  Her troponin is 1.2.  Urinalysis reveals a specific gravity of 1.016.  There are few ketones.  Her chest x-ray reveals normal cardiac silhouette.  There is some calcification of the aorta.  There is some calcification consistent with old granulomatous  disease.  There is no active cardiopulmonary disease.  Her EKG reveals sinus tachycardia.  She has inferior Q-waves and Q-waves in leads V3 and V4.  They suggest an old inferior wall myocardial infarction as well as an old anterior wall myocardial infarction. Review of her EKG from 2001 reveals a similar pattern of inferior wall myocardial infarction as well as a possible anterior wall myocardial infarction.  There are no ST-segment changes.  IMPRESSION AND PLAN: 1. Possible coronary disease.  The patient presents with positive     cardiac enzymes.  Her EKG is suggestive of an old infarct but     certainly no ST-segment elevation today.  We will get an     echocardiogram for further evaluation of her left ventricular     function and her valvular function.  We will start her on some     metoprolol and lisinopril.  We have already started her on heparin.     We will make sure that she has some aspirin on board.  She will     need a heart catheterization on Monday.  We will consider urgent     heart catheterization sooner than that if she deteriorates.  At     present she is pain free and has no shortness of breath. 2. Mental status changes.  I am perplexed as to why she has these     mental status changes.  It could be due to the myocardial     infarction.  I have given her some Romazicon and it may have helped     a little bit although not remarkably.  We will continue to hydrate     her to make sure that she is not volume depleted.  We will consult     the [facility] for further help with this issue.  We will     also draw blood cultures. 3. Hypokalemia.  We will replace her potassium.  She has been on     chlorthalidone without any potassium replacement.  We will hold     chlorthalidone for now since we are starting lisinopril and     metoprolol.  The patient may need Lasix if she is found to be     volume overloaded.     Vesta Mixer, M.D.     PJN/MEDQ  D:   10/07/2010  T:  10/07/2010  Job:  [id number]  cc:   Isidor Holts, M.D.  Electronically Signed by Kristeen Miss M.D. on 10/30/2010 07:43:55 PM

## 2010-10-31 DIAGNOSIS — R5381 Other malaise: Secondary | ICD-10-CM

## 2010-10-31 DIAGNOSIS — I6340 Cerebral infarction due to embolism of unspecified cerebral artery: Secondary | ICD-10-CM

## 2010-10-31 DIAGNOSIS — I38 Endocarditis, valve unspecified: Secondary | ICD-10-CM

## 2010-10-31 LAB — GLUCOSE, CAPILLARY
Glucose-Capillary: 100 mg/dL — ABNORMAL HIGH (ref 70–99)
Glucose-Capillary: 107 mg/dL — ABNORMAL HIGH (ref 70–99)
Glucose-Capillary: 111 mg/dL — ABNORMAL HIGH (ref 70–99)

## 2010-10-31 LAB — BASIC METABOLIC PANEL
BUN: 26 mg/dL — ABNORMAL HIGH (ref 6–23)
CO2: 27 mEq/L (ref 19–32)
GFR calc non Af Amer: 22 mL/min — ABNORMAL LOW (ref >60)
Glucose, Bld: 100 mg/dL — ABNORMAL HIGH (ref 70–99)
Potassium: 3.1 mEq/L — ABNORMAL LOW (ref 3.5–5.1)
Sodium: 138 mEq/L (ref 135–145)

## 2010-11-01 LAB — BASIC METABOLIC PANEL
Chloride: 104 mEq/L (ref 96–112)
GFR calc Af Amer: 29 mL/min — ABNORMAL LOW (ref >60)
Potassium: 3.5 mEq/L (ref 3.5–5.1)

## 2010-11-01 LAB — GLUCOSE, CAPILLARY
Glucose-Capillary: 107 mg/dL — ABNORMAL HIGH (ref 70–99)
Glucose-Capillary: 122 mg/dL — ABNORMAL HIGH (ref 70–99)

## 2010-11-02 ENCOUNTER — Other Ambulatory Visit (HOSPITAL_COMMUNITY): Payer: Medicare Other

## 2010-11-02 LAB — BASIC METABOLIC PANEL
BUN: 20 mg/dL (ref 6–23)
Calcium: 9.7 mg/dL (ref 8.4–10.5)
Creatinine, Ser: 1.83 mg/dL — ABNORMAL HIGH (ref 0.50–1.10)
GFR calc non Af Amer: 27 mL/min — ABNORMAL LOW (ref >60)
Glucose, Bld: 103 mg/dL — ABNORMAL HIGH (ref 70–99)
Sodium: 138 mEq/L (ref 135–145)

## 2010-11-02 LAB — CBC
HCT: 30.8 % — ABNORMAL LOW (ref 36.0–46.0)
Hemoglobin: 10.2 g/dL — ABNORMAL LOW (ref 12.0–15.0)
MCH: 29.6 pg (ref 26.0–34.0)
MCHC: 33.1 g/dL (ref 30.0–36.0)

## 2010-11-02 LAB — GLUCOSE, CAPILLARY: Glucose-Capillary: 120 mg/dL — ABNORMAL HIGH (ref 70–99)

## 2010-11-03 ENCOUNTER — Inpatient Hospital Stay (HOSPITAL_COMMUNITY): Payer: Medicare Other

## 2010-11-03 DIAGNOSIS — R5381 Other malaise: Secondary | ICD-10-CM

## 2010-11-03 DIAGNOSIS — I38 Endocarditis, valve unspecified: Secondary | ICD-10-CM

## 2010-11-03 DIAGNOSIS — I219 Acute myocardial infarction, unspecified: Secondary | ICD-10-CM

## 2010-11-03 DIAGNOSIS — R6510 Systemic inflammatory response syndrome (SIRS) of non-infectious origin without acute organ dysfunction: Secondary | ICD-10-CM

## 2010-11-03 DIAGNOSIS — I6340 Cerebral infarction due to embolism of unspecified cerebral artery: Secondary | ICD-10-CM

## 2010-11-03 LAB — GLUCOSE, CAPILLARY
Glucose-Capillary: 97 mg/dL (ref 70–99)
Glucose-Capillary: 113 mg/dL — ABNORMAL HIGH (ref 70–99)

## 2010-11-03 LAB — BASIC METABOLIC PANEL
CO2: 24 mEq/L (ref 19–32)
Chloride: 106 mEq/L (ref 96–112)
GFR calc Af Amer: 29 mL/min — ABNORMAL LOW (ref >60)
Potassium: 4.3 mEq/L (ref 3.5–5.1)
Sodium: 140 mEq/L (ref 135–145)

## 2010-11-03 LAB — FUNGUS CULTURE W SMEAR

## 2010-11-04 ENCOUNTER — Inpatient Hospital Stay (HOSPITAL_COMMUNITY): Payer: Medicare Other

## 2010-11-04 ENCOUNTER — Other Ambulatory Visit (HOSPITAL_COMMUNITY): Payer: Medicare Other

## 2010-11-04 DIAGNOSIS — I38 Endocarditis, valve unspecified: Secondary | ICD-10-CM

## 2010-11-04 LAB — OCCULT BLOOD X 1 CARD TO LAB, STOOL: Fecal Occult Bld: NEGATIVE

## 2010-11-04 LAB — GLUCOSE, CAPILLARY: Glucose-Capillary: 110 mg/dL — ABNORMAL HIGH (ref 70–99)

## 2010-11-04 MED ORDER — GADOBENATE DIMEGLUMINE 529 MG/ML IV SOLN
15.0000 mL | Freq: Once | INTRAVENOUS | Status: AC | PRN
  Administered 2010-11-04: 15 mL via INTRAVENOUS

## 2010-11-05 LAB — GLUCOSE, CAPILLARY
Glucose-Capillary: 115 mg/dL — ABNORMAL HIGH (ref 70–99)
Glucose-Capillary: 93 mg/dL (ref 70–99)

## 2010-11-06 ENCOUNTER — Inpatient Hospital Stay (HOSPITAL_COMMUNITY): Payer: Medicare Other

## 2010-11-06 DIAGNOSIS — R5381 Other malaise: Secondary | ICD-10-CM

## 2010-11-06 DIAGNOSIS — I6340 Cerebral infarction due to embolism of unspecified cerebral artery: Secondary | ICD-10-CM

## 2010-11-06 DIAGNOSIS — I38 Endocarditis, valve unspecified: Secondary | ICD-10-CM

## 2010-11-06 LAB — BASIC METABOLIC PANEL
BUN: 33 mg/dL — ABNORMAL HIGH (ref 6–23)
CO2: 24 mEq/L (ref 19–32)
Chloride: 102 mEq/L (ref 96–112)
GFR calc Af Amer: 26 mL/min — ABNORMAL LOW (ref >60)
GFR calc non Af Amer: 23 mL/min — ABNORMAL LOW (ref >60)
Glucose, Bld: 105 mg/dL — ABNORMAL HIGH (ref 70–99)
Potassium: 3.5 mEq/L (ref 3.5–5.1)
Potassium: 3.9 mEq/L (ref 3.5–5.1)
Sodium: 138 mEq/L (ref 135–145)
Sodium: 139 mEq/L (ref 135–145)

## 2010-11-06 LAB — GLUCOSE, CAPILLARY

## 2010-11-06 NOTE — Discharge Summary (Signed)
  NAMEMarland Kitchen  MALIK, RUFFINO NO.:  [id number]  MEDICAL RECORD NO.:  [id number]  LOCATION:  2007                         FACILITY:  [facility]  PHYSICIAN:  Thad Ranger, MD       DATE OF BIRTH:  11/04/1935  DATE OF ADMISSION:  10/07/2010 DATE OF DISCHARGE:                              DISCHARGE SUMMARY   Please note that the dictated discharge summary by Dr. Pleas Koch on October 17, 2010, still stands.  The patient's pending discharge for CIR bed availability.  The patient is likely to be discharged to Inpatient Rehab today.  The previously dictated discharge summary by Dr. Pleas Koch on October 17, 2010, still stands.  DISCHARGE DIAGNOSES:  Remain the same per the previously dictated discharge summary. 1. Non-ST-segment elevation myocardial infarction. 2. Confirmed endocarditis of unknown etiology, likely HACEK organism,     currently on day #3 of ciprofloxacin, day #10 of gentamicin, and     day #11 of vancomycin.  Infectious Disease, Dr. Daiva Eves is     currently managing the antibiotics. 3. Multiple cerebral emboli secondary to endocarditis resulting in     release phenomenon frontal signs with agitation and many other     issues inclusive of impulsivity. 4. Likely post-intensive care unit/hospital psychosis. 5. Systemic inflammatory response syndrome with acute respiratory     failure, resolved. 6. Mild hypoxemia, resolved. 7. Dysphagia III, on dysphagia diet. 8. Electrolyte derangements with hypokalemia and elevated calcium,     improved and replaced. 9. Agitation and confusion secondary to multiple etiologies, slowly     improving.  For details of the hospital course and imaging studies, please refer to the previously dictated discharge summary by Dr. Mahala Menghini on October 17, 2010.  DISPOSITION:  The patient will be discharged to CIR/Inpatient Rehab today pending bed available.  DISCHARGE TIME:  25 minutes.     Thad Ranger, MD     RR/MEDQ  D:   10/18/2010  T:  10/18/2010  Job:  [id number]  Electronically Signed by RIPUDEEP RAI  on 11/06/2010 03:40:57 PM

## 2010-11-08 LAB — BARTONELLA ANTIBODY PANEL: B henselae IgM: NEGATIVE

## 2010-11-09 ENCOUNTER — Encounter: Payer: Self-pay | Admitting: *Deleted

## 2010-11-09 NOTE — H&P (Signed)
NAMEMarland Kitchen  Frances Mendoza, Frances Mendoza NO.:  [id number]  MEDICAL RECORD NO.:  [id number]  LOCATION:  2007                         FACILITY:  [facility]  PHYSICIAN:  Ranelle Oyster, M.D.DATE OF BIRTH:  1935/03/10  DATE OF ADMISSION:  10/07/2010 DATE OF DISCHARGE:                             HISTORY & PHYSICAL   PRIMARY CARE PHYSICIAN:  Carmin Richmond, MD  CHIEF COMPLAINT:  Weakness and confusion.  HISTORY OF PRESENT ILLNESS:  This is a 75 year old white female with hypertension, admitted will altered mental status, nausea, vomiting and positive cardiac enzymes on October 07, 2010.  MRI showed multiple areas of restricted motion consistent with embolic disease.  MRA showed mild- to-moderate narrowing of the precavernous segment.  TEE showed ejection fraction 65% with no thrombus.  Neurology recommend aspirin and subcu heparin.  Cardiology saw the patient with EKG changes consistent with a non-ST-elevation MI.  They recommend continue aspirin.  Review of TEE per CVTS showed calcified mitral annulus and no vegetation with no plans for mitral valve replacement at this time.  ID saw the patient and suggested aseptic endocarditis and IV vancomycin.  Cipro were started. She had a modified barium swallow done on October 16, 2010 and was placed on D3 nectar liquid diet.  She has had problems with gait and balance. She has had delirium especially at nighttime and altered sleep cycles. Rehab was consulted and I saw her yesterday felt that she could benefit from an inpatient rehab stay.  PAST MEDICAL HISTORY:  Notable for the above as well as cholecystectomy. She denies alcohol or tobacco use.  FAMILY HISTORY:  Unremarkable.  SOCIAL HISTORY:  The patient lives with her husband who can assist as needed.  He was present at my visit today.  She has two steps to enter the house and the house is one level itself.  REVIEW OF SYSTEMS:  Notable for history of vertigo.  Confusion noted. Full  12-point review is in the written H and P and other pertinent positives are in her HPI.  The patient was a poor historian given her arousal state today.  ALLERGIES: 1. CODEINE. 2. PENICILLIN. 3. PROCARDIA.  HOME MEDICATIONS:  Lisinopril, metoprolol, and aspirin.  PHYSICAL EXAMINATION:  VITAL SIGNS:  Blood pressure is 122/71, pulse is 87, respiratory rate 20, temperature 99.7. GENERAL:  The patient is lying in bed, seems to be comfortable.  She does arouse to verbal intact. HEENT:  She generally keeps her eyes closed.  Pupils reactive to light. Ear, nose and throat exams were notable for fair dentition.  Pink moist mucosa. NECK:  Supple without JVD or lymphadenopathy. CHEST:  Clear except for occasional expiratory wheezes without rhonchi or rales. HEART:  Regular rate and rhythm.  I did not auscultate a murmur. EXTREMITIES:  Show no clubbing, cyanosis, or edema. SKIN:  Generally intact throughout except for some venipuncture sites. NEUROLOGIC:  Neurologically, she was alert to month after two attempts. She could tell me her name and the hospital she was in.  She could follow some simple one step commands, but generally kept her eyes closed and needed verbal tactile cues to redirect.  She moved all four extremities, although  she did bit weaker on the right than left, although very inconsistent.  I grade her strength on the left upper extremity at 3+ to 4/5.  She is 3-4/5 on the right upper extremity. Right lower extremity, she is 2-3/5 proximally to 4/5 distally.  Left lower extremity, she 2+-3/5 proximally to 4+/5 distally.  Again, she was inconsistent.  She did withdraw the pain in all four limbs.  Reflexes are 1+.  Cognitively, I did know her orientation.  Mood was flat given her arousal state.  POST ADMISSION PHYSICIAN EVALUATION: 1. 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  The patient with     altered mental status, impaired gait, and mobility. 2. The patient was  admitted to receive collaborative interdisciplinary     care between the physiatrist, rehab nursing staff, and therapy     team. 3. The patient's level of medical complexity and substantial therapy     needs in context of that medical necessity cannot be provided at a     lesser intensity of care. 4. The patient has experienced substantial functional loss from her     baseline.  Premorbidly, she was independent.  Currently, she is min-     assist bed mobility, mod-assist transfers with navigating cuing     needed, she is mod-assist 170 feet with rolling walker, needing     substantial cues for safety and placement of the limbs.  Judging by     the patient's diagnosis, physical exam and functional history, she     has potential for functional progress, which will result in     measurable gains while in inpatient rehab.  These gains will be of     substantial and practical use upon discharge to home facilitating     mobility and self-care. 5. The physiatrist will provide 24-hour management and medical needs     as well as oversight of the therapy plan/treatment and provide     guidance as appropriate regarding interaction of the two.  Medical     problem list and plan are below. 6. A 24-hour rehab nursing team will assist in the management of the     patient's skin care needs as well as bowel and bladder function,     safety awareness, integration of therapy, concepts and techniques. 7. PT will assess and treat for lower extremity strength, range of     motion, cognitive perceptual training and neuromuscular education,     strengthening and family/patient education with goals supervision     to modified independent. 8. OT will assess and treat for upper extremity use ADLs, adaptive     techniques, equipment, functional mobility, neuromuscular     education, cognitive perceptual training and goals modified     independent to min assist. 9. Speech language pathology will follow for  cognition, communication,     and swallowing with goals ranging from supervision/min assist to     modified independent. 10.Case management and social worker will assess and treat for     psychosocial issues and discharge planning. 11.Team conference will be held weekly to assess progress towards     goals and to determine barriers at discharge. 12.The patient demonstrated sufficient medical stability and exercise     capacity to tolerate at least 3 hours of therapy per day at least 5     days per week. 13.Estimated length stay is 12-20 days.  Prognosis is good.  The     patient has a very supportive family.  MEDICAL PROBLEM LIST AND PLAN: 1. Deep venous thrombosis prophylaxis with subcu heparin 5000 units     q.8 h.  Follow up for any bleeding, sequelae, complications.  Check     platelets and CBCs regularly.  We will check labs on admission. 2. ID:  The patient is on Cipro, gentamicin, and vancomycin for     treatment of suspected endocarditis.  We will continue per ID     recommendations.  Follow up for tolerance with medications.  Need     to be aware of risk of Clostridium difficile given these multiple     agents. 3. Cognition/sleep-wake:  We will discontinue Haldol, use Risperdal     which should be tolerated better in her case.  We will use low-dose     at night time to encourage better sleep habits with backup dosing     as needed.  Hopefully, we can avoid excessive daytime sedation. 4. CV:  Continue metoprolol 12.5 mg daily along with lisinopril.     Blood pressure today was normotensive and heart rate was under good     control. 5. Cardiovascular prophylaxis with aspirin per primary team. 6. Nutrition:  Continue with modified diet which is D3 nectar liquids     and advance diet per Speech Language Pathology recommendations.     She will need to be aware of her arousal state which will increase     her aspiration risk here. 7. History of non-ST elevation myocardial  infarction.  Follow     clinically with increased activity.  The patient is on metoprolol     for prophylaxis as well as aspirin.  The patient denies any current     symptoms.  Cardiology Team will follow along.     Ranelle Oyster, M.D.     ZTS/MEDQ  D:  10/18/2010  T:  10/18/2010  Job:  [id number]  cc:   Carmin Richmond, MD  Electronically Signed by Faith Rogue M.D. on 11/09/2010 09:57:22 AM

## 2010-11-13 ENCOUNTER — Ambulatory Visit: Payer: Medicare Other | Admitting: Infectious Disease

## 2010-11-14 NOTE — Discharge Summary (Signed)
NAMEMarland Kitchen  Frances Mendoza NO.:  [id number]  MEDICAL RECORD NO.:  [id number]  LOCATION:  4028                         FACILITY:  [facility]  PHYSICIAN:  Erick Colace, M.D.DATE OF BIRTH:  1935-11-03  DATE OF ADMISSION:  10/18/2010 DATE OF DISCHARGE:  11/06/2010                              DISCHARGE SUMMARY   DISCHARGE DIAGNOSES: 1. 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 2. Endocarditis, treated with vancomycin, gentamicin, and Cipro. 3. Acute renal insufficiency secondary to IV antibiotics. 4. Hypertension. 5. Hypokalemia, resolved. 6. Insomnia. 7. Anxiety disorder.  HISTORY OF PRESENT ILLNESS:  Frances Mendoza is a 75 year old female with history of hypertension admitted on October 07, 2010, with confusion, nausea, vomiting, headaches followed by chest pain.  The patient with positive cath.  Cardiac enzyme with question of NSTEMI.  EKG with evidence of old MI.  A 2-D echo done showed EF of 65% to 70% with calcified mitral annulus and dilated aortic root.  CT chest showed no evidence of PE, no aortic dissection, mild right hilar and mediastinal adenopathy, question reactive.  CT of head done was negative.  The patient was started on broad-spectrum antibiotics and Neuro was consulted for input due to confusion and agitation.  They recommended LP to rule out meningitis.  The patient also with history of multiple tick bites with low-grade fevers and increased white count.  MRI of brain done showed multiple areas of restricted motion, largest in left mid frontal and bilateral frontal, parietal, occipital, and mid left cerebellar area, question embolic disease and mild-to-moderate narrowing precavernous and cavernous segment right ICA.  TEE done with suggestion of small mitral vegetation.  ID was consulted for input and ID feels patient with possible endocarditis with septic emboli.  Currently, the patient on vancomycin,  gentamicin, and Cipro.  The patient was intubated from October 08, 2010, to October 11, 2010.  Once extubated, she was noted to have mild dysphagia and the patient is currently on D3 diet nectar liquids.  Repeat MBS of October 16, 2010, without improvement. Currently, the patient continues with bouts of anxiety with confusion and insomnia.  She remains confused with poor safety, decreased awareness, impulsivity, requiring cues for safe rolling walker use.  The patient was evaluated by rehab and we felt that she would benefit from a CIR program.  PAST MEDICAL HISTORY:  Significant for, 1. Hypertension. 2. Anxiety disorder. 3. Vertigo. 4. OA. 5. Lap chole in 2001. 6. Colon polyps. 7. Diverticulosis. 8. Hemorrhoids. 9. Hysterectomy.  ALLERGIES:  CODEINE, PENICILLIN, PROCARDIA, TOLECTIN.  Now the patient with allergies to MEGACE causing severe rash as well as PRIMAXIN causing rash.  REVIEW OF SYMPTOMS:  Positive for lumbar weakness as well as anxiety issues.  FAMILY HISTORY:  Positive for coronary artery disease.  SOCIAL HISTORY:  The patient is married, was independent and active prior to admission.  She does not use any tobacco or alcohol.  She lives in 1-level home with two steps at entry.  FUNCTIONAL HISTORY:  The patient was independent prior to admission.  FUNCTIONAL STATUS:  The patient is min guard assist for bed mobility, mod assist transfers, mod assist  ambulating 170 feet with rolling walker.  She requires setup with supervision and min assist for standing balance and bathing at sink.  She required mod assist for toileting.  PHYSICAL EXAMINATION:  VITAL SIGNS:  Blood pressure 122/71, pulse 87, respiratory rate 20, temperature 99.7. GENERAL:  The patient is a well-nourished, well-developed female lying in bed in no acute distress. HEENT:  Pupils equal and reactive to light.  The patient does keep eyes closed.  Oral mucosa is pink and moist with fair dentition. NECK:   Supple without JVD or lymphadenopathy. LUNGS:  Clear to auscultation with occasional wheeze noted. HEART:  Regular rate and rhythm without murmurs or gallops. EXTREMITIES:  No evidence of clubbing, cyanosis, or edema. NEUROLOGIC:  The patient was oriented to self and hospital.  She required couple of attempts to name month.  She could follow simple one- step commands and needs verbal and tactile cues for the direction occasionally.  She was able to move all four extremities, but did appear a bit weak on right than left, although this was inconsistent.  Strength in left upper extremity is 3+ to 4/5, right is at 3-4/5.  Right lower extremity is 2-3/5 proximally 4/5 distally.  Left lower extremity is 2+ to 3/5 proximally and 4+/5 distally.  The patient withdraws to pain in all four limbs.  Reflexes 1+.  Mood was flat.  HOSPITAL COURSE:  [name] was admitted to rehab on October 18, 2010, for inpatient therapies to consist of PT, OT, and speech therapy at least 3 hours 5 days a week.  Past admission physiatrist, rehab RN, and therapy team have worked together to provide customized collaborative interdisciplinary care.  Rehab RN has worked with the patient on bowel and bladder programming as well as on monitoring the patient's p.o. intake.  Initially the patient was noted to have problems voiding.  Rehab RN has worked with the patient on toileting patient q.3- 4 h. while awake and urinary retention issues have resolved.  On a.m. past admission on October 19, 2010, the patient was noted to have T-max of 102.  UA/UC was rechecked.  CBC done revealed leukocytosis with white count of 13.3, H and H at 11.6 and 35.3.  Renal status revealed BUN at 12, creatinine 0.49.  Chest x-ray was done showing mild bibasilar atelectasis.  ID was consulted for input.  Blood cultures x2 were reordered and ID recommended no change in antibiotics yet.  On October 20, 2010, the patient was noted to have recurrent  fever.  ID recommended CT abdomen and pelvis as well as chest.  CT of chest showed new small right pleural effusion with underlying right lower lobe consolidation suspicious for right lower lobe pneumonia.  CT abdomen and pelvis was negative for inflammatory change, 8-mm nonspecific low-density lesion right hepatic lobe, likely benign cyst, small hiatal hernia, and colonic diverticulosis.  The patient was started on Primaxin on September 19, 2010. She did develop a rash on October 22, 2010, therefore Primaxin was discontinued.  The patient was maintained on vancomycin, Cipro, and gentamicin.  The patient was also placed on probiotic to prevent any infectious diarrhea.  She has had some issues with vulvovaginitis that was treated with Diflucan x3 days and q. week, which is to continue until antibiotics are discontinued.  Miconazole vaginal cream was also used on p.r.n. basis to help with symptomatology.  The patient's p.o. intake was variable.  So Megace was added to see if this might help with improving her appetite;  however, the patient developed rash due to this so this was discontinued.  The patient has had issues with insomnia initially, Risperdal was continued at a lower dose; however, this was ineffective.  She was changed over to Seroquel and currently continues on Seroquel 12.5 mg p.o. at bedtime to help with her symptomatology. She has had issues with anxiety and she felt that Xanax, which was being used was ineffective, therefore this was changed over to Valium, which she uses at home.  Currently Valium 2.5 mg p.o. b.i.d. on p.r.n. basis is effective.  The patient is advised to follow up with primary MD regarding long-term antidepressant with anxiolytic effects to help with her overall symptomatology.  The patient was maintained on gentamicin and vancomycin for 4 total weeks.  Routine check of labs were done during this stay.  The patient has had issues with hypokalemia that  has resolved.  She was noted to have some worsening in her renal status with BUN and creatinine rising to 26 and 2.17.  She was treated with IV fluids at 70-78 x2 nights and additionally she was advised to push p.o. fluids.  The patient on dysphagia diet with nectar liquids at admission. She was advanced to thin liquids in between meals during her stay. Repeat MBS was done on November 06, 2010, and currently the patient has been advanced to thin liquids at all times.  No straws.  Check of lytes at on day of discharge reveals sodium 139, potassium 3.9, chloride 102, CO2 of 24, BUN 33, creatinine 2.21, glucose 131.  The patient and family have been advised regarding pushing p.o. fluids past discharge.  Repeat lytes to be checked on Wednesday, November 09, 2010, and Monday, November 13, 2010, with results to Dr. Lois Huxley and Dr. Daiva Eves. Anticipate with vancomycin and gentamicin being discontinued, her renal status will continue to improve.  If this does not normalize out, she will need further workup in the future.  The patient's PICC line was discontinued on day of discharge.  CBC has been followed along.  H and H did show some drop to 10.2 and 30.8, white count normalizing out at 9.4, platelets 298.  Stool guaiacs x1 was done and this was negative.  The patient's blood pressures have been checked on b.i.d. basis.  These are currently ranging from 110s to 120s systolics, diastolic in 70s.  Fevers have resolved.  Repeat MRI of brain was done on November 04, 2010, showing some continuation of bilateral supratentorial embolic infarcts, but decreased in severity compared to prior x-rays of October 08, 2010. ID has evaluated this and feels that 4 weeks of antibiotic therapy should be effective for treatment of her endocarditis.  An orthopantogram was done prior to discharge and this shows no destructive bony changes.  No abscess.  During the patient's stay in rehab, weekly team  conferences were held to monitor the patient's progress, set goals as well as discuss barriers to discharge.  The patient's family have been very supportive and have been present for multiple therapy sessions.  The patient has had issues with anxiety and ego support has been provided by rehab team.  At admission, the patient was limited by decreased balance, decreased strength, confabulation with decreased attention, disorientation, and decrease in functional ability.  She required mod to max assist for all mobility and transfers.  She was unable to ambulate due to her cognitive defecits.  Currently, the patient has progressed along to being at supervision for transfers and mobility.  She is able to ambulate household distances and able to maintain selective attention to functional tasks greater than 12 minutes.  Family education has been done with husband as well as son about supervision required due to safety and they will continue to provide this.  Further followup home health therapies to continue by [facility] past discharge. Speech Therapy has worked with the patient on dysphagia treatment as well as cognitive deficits.  The patient was noted to have decrease in sustain attention impacting her awareness of recall as well as ability to communicate wants and needs.  Currently, the patient has been advanced to regular diet thin liquids with strong cough past each sip, no straws for now.  Speech Therapy has worked with the patient on improving attention to basic tasks.  They have also worked on basic problem solving and utilization of external environmental aids for orientation.  They have also been working on dysphagia treatment with cuing to follow swallow strategies.  Currently she requires min cues to maintain to safe swallow strategies.  She is showing no signs or symptoms of aspiration.  She requires supervision to attend to meals as well as simple tasks.  OT has worked  with the patient on self-care tasks.  The patient has made steady progress during her stay and she is currently at supervision level overall for bathing and dressing.  Family has been very supportive and will continue to provide supervision past discharge..  Further followup home health, OT, and speech therapy to continue by [facility].  On November 06, 2010, the patient is discharged to home.  DISCHARGE MEDICATIONS: 1. Cipro 500 mg p.o. daily through November 12, 2010. 2. Valium 5 mg half p.o. b.i.d. p.r.n. anxiety, #10 Rx. 3. Diflucan 100 mg p.o. q. Wednesday.May D/C when done with Cipro 4. Flonase nasal spray 2 sprays each nostril b.i.d. as needed. 5. Hydrocortisone cream topically b.i.d. p.r.n. itching. 6. Toprol-XL 25 mg p.o. per day. 7. Cortisporin Otic solution 4 drops in left ear 3 times a day. 8. Protonix 40 mg p.o. per day. 9. Protein supplements t.i.d. 10.Seroquel 25 mg half p.o. at bedtime p.r.n. insomnia. 11.Florastor 250 mg two p.o. b.i.d. 12.Sarna Anti-Itch lotion q.i.d. 13.Zyrtec 10 mg p.o. per day for itching and rash. 14.Aspirin 81 mg p.o. per day.  DIET:  Regular thin liquids, strips, no straws.  ACTIVITY LEVEL:  24-hour supervision.  No strenuous activity.  SPECIAL INSTRUCTIONS:  No alcohol, no smoking, no driving.  Walk with supervision.  [facility] to provide PT, OT, speech therapy, and RN.  Need to follow up with a dentist for routine check.  FOLLOWUP:  The patient to follow up with Dr. Wynn Banker on November 21, 2010, at 8:30 for 9 a.m. appointment.  Follow up with Dr. Elease Hashimoto on November 21, 2010, at 11:30 a.m.  Follow up with Dr. Daiva Eves on November 29, 2010, at 10:45 a.m.  Follow up with Dr. Lois Huxley in 1 week and early if rash worsens.     Frances Mendoza, P.A.   ______________________________ Erick Colace, M.D.    PL/MEDQ  D:  11/06/2010  T:  11/07/2010  Job:  [id number]  cc:   Vesta Mixer, M.D. Pramod P. Pearlean Brownie,  MD Acey Lav, MD Lois Huxley, MD  Electronically Signed by Osvaldo Shipper. on 11/13/2010 10:41:32 AM Electronically Signed by Claudette Laws M.D. on 11/14/2010 01:36:51 PM

## 2010-11-16 DIAGNOSIS — Z0271 Encounter for disability determination: Secondary | ICD-10-CM

## 2010-11-21 ENCOUNTER — Inpatient Hospital Stay: Payer: Medicare Other | Admitting: Physical Medicine & Rehabilitation

## 2010-11-21 ENCOUNTER — Ambulatory Visit (INDEPENDENT_AMBULATORY_CARE_PROVIDER_SITE_OTHER): Payer: Medicare Other | Admitting: Cardiovascular Disease

## 2010-11-21 ENCOUNTER — Ambulatory Visit (HOSPITAL_COMMUNITY): Payer: Medicare Other | Attending: Cardiovascular Disease

## 2010-11-21 ENCOUNTER — Encounter: Payer: Self-pay | Admitting: Cardiovascular Disease

## 2010-11-21 ENCOUNTER — Encounter: Payer: Medicare Other | Attending: Physical Medicine & Rehabilitation

## 2010-11-21 VITALS — BP 156/88 | HR 87 | Ht 64.0 in | Wt 166.8 lb

## 2010-11-21 DIAGNOSIS — I639 Cerebral infarction, unspecified: Secondary | ICD-10-CM | POA: Insufficient documentation

## 2010-11-21 DIAGNOSIS — R413 Other amnesia: Secondary | ICD-10-CM | POA: Insufficient documentation

## 2010-11-21 DIAGNOSIS — I330 Acute and subacute infective endocarditis: Secondary | ICD-10-CM | POA: Insufficient documentation

## 2010-11-21 DIAGNOSIS — I2510 Atherosclerotic heart disease of native coronary artery without angina pectoris: Secondary | ICD-10-CM

## 2010-11-21 DIAGNOSIS — I38 Endocarditis, valve unspecified: Secondary | ICD-10-CM

## 2010-11-21 DIAGNOSIS — R279 Unspecified lack of coordination: Secondary | ICD-10-CM | POA: Insufficient documentation

## 2010-11-21 DIAGNOSIS — R269 Unspecified abnormalities of gait and mobility: Secondary | ICD-10-CM | POA: Insufficient documentation

## 2010-11-21 DIAGNOSIS — N289 Disorder of kidney and ureter, unspecified: Secondary | ICD-10-CM | POA: Insufficient documentation

## 2010-11-21 DIAGNOSIS — I10 Essential (primary) hypertension: Secondary | ICD-10-CM | POA: Insufficient documentation

## 2010-11-21 DIAGNOSIS — Z8673 Personal history of transient ischemic attack (TIA), and cerebral infarction without residual deficits: Secondary | ICD-10-CM | POA: Insufficient documentation

## 2010-11-21 DIAGNOSIS — I76 Septic arterial embolism: Secondary | ICD-10-CM | POA: Insufficient documentation

## 2010-11-21 DIAGNOSIS — Z9181 History of falling: Secondary | ICD-10-CM | POA: Insufficient documentation

## 2010-11-21 DIAGNOSIS — B9689 Other specified bacterial agents as the cause of diseases classified elsewhere: Secondary | ICD-10-CM | POA: Insufficient documentation

## 2010-11-21 DIAGNOSIS — I6350 Cerebral infarction due to unspecified occlusion or stenosis of unspecified cerebral artery: Secondary | ICD-10-CM

## 2010-11-21 DIAGNOSIS — I749 Embolism and thrombosis of unspecified artery: Secondary | ICD-10-CM | POA: Insufficient documentation

## 2010-11-21 DIAGNOSIS — I339 Acute and subacute endocarditis, unspecified: Secondary | ICD-10-CM | POA: Insufficient documentation

## 2010-11-21 DIAGNOSIS — I6340 Cerebral infarction due to embolism of unspecified cerebral artery: Secondary | ICD-10-CM

## 2010-11-21 MED ORDER — HYDROXYZINE HCL 25 MG PO TABS: 25.0000 mg | ORAL_TABLET | Freq: Three times a day (TID) | ORAL | Status: DC | PRN

## 2010-11-21 MED ORDER — METOPROLOL SUCCINATE ER 25 MG PO TB24: 12.5000 mg | ORAL_TABLET | Freq: Every day | ORAL | Status: DC

## 2010-11-21 NOTE — Patient Instructions (Signed)
Your physician has requested that you have an echocardiogram. Echocardiography is a painless test that uses sound waves to create images of your heart. It provides your doctor with information about the size and shape of your heart and how well your heart's chambers and valves are working. This procedure takes approximately one hour. There are no restrictions for this procedure.  Your metoprolol was refilled electronically.    Your physician recommends that you schedule a follow-up appointment in: 3 months and have bmp lab draw at that time.

## 2010-11-21 NOTE — Assessment & Plan Note (Signed)
A 75 year old female with history of hypertension admitted on October 07, 2010, with confusion, nausea, vomiting, and headaches, followed by chest pain.  EKG showed an old MI.  A 2-D echo showed a normal ejection fraction, calcified mitral annulus, dilated aortic root, but otherwise no other abnormalities noted.  CT of chest showed no evidence of PE.  CT of the head was initially negative, started on broad-spectrum antibiotics, LP to rule out meningitis.  Multiple tick bites.  Low-grade fever.  MRI showed multiple areas of restricted motion, largest in the left mid frontal, bilateral frontoparietal, occipital, and mid left cerebellar area, question embolic disease.  TEE with suggestion of small mitral vegetation.  Ultimately HACEK organism felt to be the causative factor of an infected endocarditis with cerebral emboli.  ALLERGIES:  CODEINE, PENICILLIN, PROCARDIA, and TOLECTIN.  MEGACE causing rash.  PRIMAXIN causing rash.  During rehab stay, also noted to have vulvovaginitis, treated with Diflucan.  She had sleep issues which responded well to Seroquel. Dysphagia which improved to normal.  She went through PT, OT, and speech therapy.  Made good progress.  She is at home environment with family supervision 24 hours per day. Home health PT, OT, speech.  Speech has discontinued.  PT and OT continue.  Fell yesterday falling backwards without injury during attempted laundry activity.  She needs help with bathing, meal prep, household duties, and shopping. Her bathing assist is basically for tub and shower transfers, otherwise is independent.  REVIEW OF SYSTEMS:  Positive for trouble walking, confusion, depression, anxiety, diarrhea, and constipation.  SOCIAL HISTORY:  Married.  PHYSICAL EXAMINATION:  VITAL SIGNS:  Blood pressure 148/65, pulse 93, respirations 16, and O2 saturation 100% on room air. GENERAL:  No acute distress.  Mood and affect appropriate. NEUROLOGIC:  Her lower  extremities have full strength as do her upper extremities.  She has a gait that is unsteady, widened base support, tends to fall sideways when turning when she does not use assisted device.  Her memory is 0/3 unrelated objects after 2 minutes delay.  She is able to do serial 3's but slowly she is able to count backward from 10 down to 1.  She is oriented x3.  She remembers me as a doctor but could not tell me my role in her care.  IMPRESSION: 1. 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 2. Memory deficit secondary to above as well as balance deficits.  PLAN: 1. We will write a prescription for outpatient therapy when she     finishes up with home health which should be in the next 1-2 weeks.     We will have her continue some speech for her memory and PT for her     balance. 2. Primary care follow up Dr. Earlene Plater. 3. ID follow up with Dr. Daiva Eves. 4. Cardiology follow up with Dr. Elease Hashimoto. 5. I will see her back in 4-6 weeks after she completes her outpatient     therapy.     Erick Colace, M.D. Electronically Signed    AEK/MedQ D:  11/21/2010 09:42:27  T:  11/21/2010 12:11:03  Job #:  [id number]  cc:   Vesta Mixer, M.D. Fax: [phone]  Acey Lav, MD Fax: [phone]  Dr. Hassan Rowan [facility], [facility]

## 2010-11-21 NOTE — Progress Notes (Signed)
Frances Mendoza Date of Birth  03-Aug-1935 [facility] [address]    [address] [place], [place]  [address] [phone]  Fax  [phone]  History of Present Illness:  75 year old lady with a history of endocarditis. She had septic emboli to her brain and subsequent had a stroke. She had a long hospitalization that lasted about one month. She was treated with multiple antibiotics. She had some renal insufficiency because of these antibiotics. She was sent home on ciprofloxacin but had a drug reaction with Cipro. She had lots of skin reaction and now has had lots of skin sloughing.  She still has some residual neurologic defects but she is overall doing progressively better.  She has some balance issues.   She has not had any chest pain.  She has occasional dyspnea with exertion.   Current Outpatient Prescriptions on File Prior to Visit  Medication Sig Dispense Refill  . aspirin 81 MG tablet Take 81 mg by mouth daily.        . diazepam (VALIUM) 5 MG tablet Take 2.5 mg by mouth every 12 (twelve) hours as needed.        . metoprolol succinate (TOPROL-XL) 25 MG 24 hr tablet Take 12.5 mg by mouth daily.        . pantoprazole (PROTONIX) 40 MG tablet Take 40 mg by mouth every 12 (twelve) hours.          Allergies  Allergen Reactions  . Ciprofloxacin     Rash  . Codeine   . Penicillins   . Procardia (Nifedipine)   . Prolactin   . Tolectin (Tolmetin Sodium)     Past Medical History  Diagnosis Date  . Hypertension   . Diverticulosis   . Hemorrhoids, internal   . Anxiety   . Arthritis     osteoarthritis  . Vertigo   . 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 Endocarditis 10/18/2010    treated with vancomycin, gentamicin, and Cipro  . Hypokalemia   . Insomnia     Past Surgical History  Procedure Date  . Cholecystectomy 09/29/1999  . Abdominal hysterectomy   . Bladder tuck   . Tee  without cardioversion 10/10/2010     done with suggestion of small mitral vegetation.  ID was consulted for input and ID feels  patient with possible endocarditis with septic emboli    History  Smoking status  . Never Smoker   Smokeless tobacco  . Never Used    History  Alcohol Use No    Family History  Problem Relation Age of Onset  . Coronary artery disease      family history of  . Colon cancer      family history of    Reviw of Systems:  Reviewed in the HPI.  All other systems are negative.  Physical Exam: BP 156/88  Pulse 87  Ht 5\' 4"  (1.626 m)  Wt 166 lb 12.8 oz (75.66 kg)  BMI 28.63 kg/m2 The patient is alert and oriented x 3.  The mood and affect are normal.   Skin: warm and dry.  Color is normal.    HEENT:   the sclera are nonicteric.  The mucous membranes are moist.  The carotids are 2+ without bruits.  There is no thyromegaly.  There is no JVD.    Lungs: clear.  The chest wall is non tender.    Heart:  regular rate with a normal S1 and S2.  There are no murmurs, gallops, or rubs. The PMI is not displaced.     Abdomen: good bowel sounds.  There is no guarding or rebound.  There is no hepatosplenomegaly or tenderness.  There are no masses.   Extremities:  no clubbing, cyanosis, or edema.  The legs are without rashes.  The distal pulses are intact.   Neuro:  Cranial nerves II - XII are intact.  Motor and sensory functions are intact.     ECG: NSR, Inferior MI, anterior MI - old.  Assessment / Plan:

## 2010-11-21 NOTE — Assessment & Plan Note (Addendum)
She seems to be doing fairly well. She still has a soft systolic murmur. We'll continue her same medications. We'll get an echocardiogram.  She has a soft systolic murmur.  If her valve is still fairly functional, I think we will continue with conservative therapy. If the valve comes quite abnormal then we may need to consider valvular surgery.

## 2010-11-21 NOTE — Assessment & Plan Note (Signed)
She ruled in for myocardial infarction at the time of her endocarditis and stroke. She has not had any episodes of angina. She's not a good candidate for interventional procedures.  If she continues to make good progress in the we will certainly could do further stress testing or cardiac catheterization if needed.  At present she is completely asymptomatic and I do not think that we need to do any further testing at this time. I'll continue to reassess this each time I  see her.

## 2010-11-21 NOTE — Assessment & Plan Note (Signed)
She had a very large embolic stroke which presented with her endocarditis. In fact she had severe neurologic deficits and was on the ventilator for quite some time. She had a very prolonged hospitalization and was on strong antibiotics for about a month.  Her prognosis was very poor and  The chances of her living through that stroke were fairly low.  Amazingly she has done very well. She still has some residual defects but overall she's made significant progress. Because of her severe neurologic deficit, we have not done any further evaluation of her coronary artery disease or her endocarditis.  She'll followup with her medical doctor.

## 2010-11-29 ENCOUNTER — Encounter: Payer: Self-pay | Admitting: Infectious Disease

## 2010-11-29 ENCOUNTER — Ambulatory Visit (INDEPENDENT_AMBULATORY_CARE_PROVIDER_SITE_OTHER): Payer: Medicare Other | Admitting: Infectious Disease

## 2010-11-29 VITALS — BP 168/84 | HR 87 | Temp 97.5°F | Ht 64.0 in | Wt 168.0 lb

## 2010-11-29 DIAGNOSIS — I639 Cerebral infarction, unspecified: Secondary | ICD-10-CM

## 2010-11-29 DIAGNOSIS — L490 Exfoliation due to erythematous condition involving less than 10 percent of body surface: Secondary | ICD-10-CM

## 2010-11-29 DIAGNOSIS — I38 Endocarditis, valve unspecified: Secondary | ICD-10-CM

## 2010-11-29 DIAGNOSIS — L270 Generalized skin eruption due to drugs and medicaments taken internally: Secondary | ICD-10-CM | POA: Insufficient documentation

## 2010-11-29 DIAGNOSIS — I76 Septic arterial embolism: Secondary | ICD-10-CM

## 2010-11-29 DIAGNOSIS — B373 Candidiasis of vulva and vagina: Secondary | ICD-10-CM

## 2010-11-29 DIAGNOSIS — N289 Disorder of kidney and ureter, unspecified: Secondary | ICD-10-CM | POA: Insufficient documentation

## 2010-11-29 DIAGNOSIS — B3731 Acute candidiasis of vulva and vagina: Secondary | ICD-10-CM

## 2010-11-29 DIAGNOSIS — E876 Hypokalemia: Secondary | ICD-10-CM

## 2010-11-29 DIAGNOSIS — I6340 Cerebral infarction due to embolism of unspecified cerebral artery: Secondary | ICD-10-CM

## 2010-11-29 DIAGNOSIS — I2690 Septic pulmonary embolism without acute cor pulmonale: Secondary | ICD-10-CM

## 2010-11-29 DIAGNOSIS — L299 Pruritus, unspecified: Secondary | ICD-10-CM

## 2010-11-29 DIAGNOSIS — L538 Other specified erythematous conditions: Secondary | ICD-10-CM

## 2010-11-29 HISTORY — DX: Disorder of kidney and ureter, unspecified: N28.9

## 2010-11-29 MED ORDER — FLUCONAZOLE 100 MG PO TABS: 100.0000 mg | ORAL_TABLET | Freq: Every day | ORAL | Status: AC

## 2010-11-29 MED ORDER — HYDROXYZINE HCL 25 MG PO TABS: 25.0000 mg | ORAL_TABLET | Freq: Three times a day (TID) | ORAL | Status: AC | PRN

## 2010-11-29 NOTE — Progress Notes (Signed)
Subjective:    Patient ID: Frances Mendoza, female    DOB: 11/14/1935, 75 y.o.   MRN: [id number]  HPI  20 -year-old lady who had a quite complicated hospital stay in August into September. She was admitted with altered mental status and found to have a lesions on MRI imaging of the brain consistent with possible septic emboli. Her blood cultures on admission were actually sterile. She did undergo a transesophageal echocardiogram which showed evidence of a vegetation on the mitral valve. Her course was complicated by intubation and treatment for ventilator associated pneumonia. Patient was treated broadly with vancomycin and Primaxin  a long with gentamicin.  Once she finished therapy for healthcare associated pneumonia she was continued on bag vancomycin gentamicin and then ciprofloxacin. Serologies were sent for Bartonella as well as Coxiella to look for a possible culture-negative endocarditis pathogen. These were all negative on initial testing and repeat test in one month later. She completed a month of therapy with vancomycin and gentamicin as well as a month of therapy with ciprofloxacin. Repeat MRI showed resolution of her prior areas of septic emboli in the brain. She was discharged to home and continued on oral ciprofloxacin to complete that part of her culture-negative endocarditis regimen. 4 slightly she developed a diffuse erythematous rash. He was apparently told to continue the ciprofloxacin despite this involving drug rash and she developed a severe drug rash with exfoliation or shins ofher skin. The ciprofloxacin was ultimately discontinued on September 21. Her exfoliative dermatitis has improved although she continues to suffer from pruritus. She is also given prednisone for this  . She has improved dramatically since her stay in the hospital and is now oriented to person place and date although not day of the week. She looks much better on exam today. She is still suffering from pruritus  he also is suffering apparently a vaginal yeast infection as well. She was accompanied by a daughter and other family member had multiple non-infectious disease questions to the one just described as well as concerns for recent hypokalemia address the majority of these although I did not retest her blood for a checking a potassium level as this would be checked tomorrow with her primary care physician.. In total I spent over 45 minutes with this pt including greater than 50% of the time in face to face counselling of the pt and in coordination of her care.   Dr. Hassan Rowan PRimary care  Review of Systems  Constitutional: Negative for fever, chills, diaphoresis, activity change, appetite change, fatigue and unexpected weight change.  HENT: Negative for congestion, sore throat, rhinorrhea, sneezing, trouble swallowing and sinus pressure.   Eyes: Negative for photophobia and visual disturbance.  Respiratory: Negative for cough, chest tightness, shortness of breath, wheezing and stridor.   Cardiovascular: Negative for chest pain, palpitations and leg swelling.  Gastrointestinal: Negative for nausea, vomiting, abdominal pain, diarrhea, constipation, blood in stool, abdominal distention and anal bleeding.  Genitourinary: Negative for dysuria, hematuria, flank pain and difficulty urinating.  Musculoskeletal: Negative for myalgias, back pain, joint swelling, arthralgias and gait problem.  Skin: Positive for color change and rash. Negative for pallor and wound.  Neurological: Negative for dizziness, tremors, weakness and light-headedness.  Hematological: Negative for adenopathy. Does not bruise/bleed easily.  Psychiatric/Behavioral: Negative for behavioral problems, confusion, sleep disturbance, dysphoric mood, decreased concentration and agitation.       Objective:   Physical Exam  Constitutional: She is oriented to person, place, and time. She appears well-developed and  well-nourished. No distress.   HENT:  Head: Normocephalic and atraumatic.  Mouth/Throat: Oropharynx is clear and moist. No oropharyngeal exudate.  Eyes: Conjunctivae and EOM are normal. Pupils are equal, round, and reactive to light. No scleral icterus.  Neck: Normal range of motion. Neck supple. No JVD present.  Cardiovascular: Normal rate, regular rhythm and normal heart sounds.  Exam reveals no gallop and no friction rub.   No murmur heard. Pulmonary/Chest: Effort normal and breath sounds normal. No respiratory distress. She has no wheezes. She has no rales. She exhibits no tenderness.  Abdominal: She exhibits no distension and no mass. There is no tenderness. There is no rebound and no guarding.  Musculoskeletal: She exhibits no edema and no tenderness.  Lymphadenopathy:    She has no cervical adenopathy.  Neurological: She is alert and oriented to person, place, and time. She exhibits normal muscle tone. Coordination normal.  Skin: Skin is warm and dry. Rash noted. She is not diaphoretic. There is erythema. No pallor.       Diffuse erythema with some areas of exfoliation on feet still  Psychiatric: She has a normal mood and affect. Her behavior is normal. Judgment and thought content normal.          Assessment & Plan:  Endocarditis NEC Culture negative endocarditis. Will check set of surveillance cultures today. She has completed therapy. I have advised her to be seen by her dentist as her teeth could have been a source of this presumed infetious endocarditis. Panorex was negative  Drug rash Refilled her atarax  Yeast infection involving the vagina and surrounding area Fluconazole for 10 days  Renal insufficiency Likely related to aminoglycoside therapy. HOepfully this will resolve. PCP is getting bloodwork for this  Hypokalemia pcp checking this in am

## 2010-11-29 NOTE — Assessment & Plan Note (Signed)
Refilled her atarax

## 2010-11-29 NOTE — Assessment & Plan Note (Signed)
Culture negative endocarditis. Will check set of surveillance cultures today. She has completed therapy. I have advised her to be seen by her dentist as her teeth could have been a source of this presumed infetious endocarditis. Panorex was negative

## 2010-11-29 NOTE — Assessment & Plan Note (Signed)
Likely related to aminoglycoside therapy. HOepfully this will resolve. PCP is getting bloodwork for this

## 2010-11-29 NOTE — Assessment & Plan Note (Signed)
Fluconazole for 10 days

## 2010-11-29 NOTE — Assessment & Plan Note (Signed)
pcp checking this in am

## 2010-12-05 LAB — CULTURE, BLOOD (SINGLE): Organism ID, Bacteria: NO GROWTH

## 2010-12-06 ENCOUNTER — Telehealth: Payer: Self-pay | Admitting: *Deleted

## 2010-12-06 NOTE — Telephone Encounter (Signed)
Advised of echo results 

## 2010-12-06 NOTE — Telephone Encounter (Signed)
Message copied by Eugenia Pancoast on Wed Dec 06, 2010  9:44 AM ------      Message from: Vesta Mixer      Created: Tue Dec 05, 2010  6:23 PM       No evidence of vegetation.  Echo is essentially unchanged from previous

## 2010-12-26 ENCOUNTER — Ambulatory Visit: Payer: Medicare Other | Admitting: Physical Medicine & Rehabilitation

## 2010-12-26 ENCOUNTER — Encounter: Payer: Medicare Other | Attending: Physical Medicine & Rehabilitation

## 2010-12-26 DIAGNOSIS — R279 Unspecified lack of coordination: Secondary | ICD-10-CM | POA: Insufficient documentation

## 2010-12-26 DIAGNOSIS — Z9181 History of falling: Secondary | ICD-10-CM | POA: Insufficient documentation

## 2010-12-26 DIAGNOSIS — B9689 Other specified bacterial agents as the cause of diseases classified elsewhere: Secondary | ICD-10-CM | POA: Insufficient documentation

## 2010-12-26 DIAGNOSIS — I10 Essential (primary) hypertension: Secondary | ICD-10-CM | POA: Insufficient documentation

## 2010-12-26 DIAGNOSIS — I76 Septic arterial embolism: Secondary | ICD-10-CM | POA: Insufficient documentation

## 2010-12-26 DIAGNOSIS — I69993 Ataxia following unspecified cerebrovascular disease: Secondary | ICD-10-CM

## 2010-12-26 DIAGNOSIS — I749 Embolism and thrombosis of unspecified artery: Secondary | ICD-10-CM | POA: Insufficient documentation

## 2010-12-26 DIAGNOSIS — R413 Other amnesia: Secondary | ICD-10-CM | POA: Insufficient documentation

## 2010-12-26 DIAGNOSIS — I330 Acute and subacute infective endocarditis: Secondary | ICD-10-CM | POA: Insufficient documentation

## 2010-12-26 DIAGNOSIS — F329 Major depressive disorder, single episode, unspecified: Secondary | ICD-10-CM

## 2010-12-26 DIAGNOSIS — R269 Unspecified abnormalities of gait and mobility: Secondary | ICD-10-CM | POA: Insufficient documentation

## 2010-12-26 NOTE — Assessment & Plan Note (Signed)
REASON FOR VISIT:  Bilateral strokes.  CHIEF COMPLAINT:  Cries a lot.  HISTORY:  This is a 75 year old female with prior history of hypertension admitted to hospital on October 07, 2010 with confusion, nausea, vomiting, and headaches followed by chest pain.  EKG showed no new myocardial infarct.  CT of the chest showed no evidence of PE.  CT of the head was initially negative.  She was started on broad-spectrum antibiotics.  She had multiple tick bites and low-grade fever.  MRI showed multiple areas of restricted motion, largest in the left mid frontal and bilateral frontal parietal occipital and mid left cerebellar areas, question embolic disease.  TEE showed small mitral vegetation and felt to have a HACEK organism causing infectious endocarditis with cerebral emboli.  ALLERGIES: 1. CODEINE. 2. PENICILLIN. 3. PROCARDIA. 4. TOLECTIN. 5. MEGACE causing rash. 6. PRIMAXIN causing rash.  CURRENT THERAPY SCHEDULE:  She is getting speech and PT from Outpatient Therapy Department at [facility] [place], [place].  She has had no further falls.  She has had trial of driving in her area.  I advised her against this, stating that her reaction time is still not good. She needs assist with certain bathing activities as well as shopping but is otherwise independent.  REVIEW OF SYSTEMS:  Positive for confusion, depression, anxiety, and weakness.  SOCIAL HISTORY:  Married, lives with her husband.  Her son is with her today as well.  PHYSICAL EXAMINATION:  VITAL SIGNS:  Blood pressure 180/84.  She will follow up with Dr. Earlene Plater on this, generally it has been running about 160 systolic/80 diastolic, pulse 75, respirations 16, O2 saturation 98% on room air. GENERAL:  No acute distress.  Mood and affect appropriate. NEUROLOGIC:  Memory is 2/3 unrelated to objects after 2 minutes delay. Motor strength is 4/5 in bilateral upper and lower extremities.  Gait, she has a widened  base support.  No evidence of toe drag or knee instability.  No limb ataxia noted.  Mood and affect are mildly labile, starts to cry when she is talking about her stroke history.  IMPRESSION AND PLAN: 1. Bilateral cerebral emboli with primarily cognitive deficits as well     as ataxia. 2. Emotional lability, question whether this is an isolated problem     due to stroke versus a sign of a more broad poststroke depression.     In either case, sertraline would be the medication of choice.  We     will start with 25 mg in the morning.  Advised her that it may take     up to a month to really get in her system and we may need to     increase to 50 when I see her back in 1 month.  Discussed with the patient and family agree with plan.  We will continue outpatient therapy.  I will monitor again in 1 month's time.     Erick Colace, M.D. Electronically Signed    AEK/MedQ D:  12/26/2010 12:07:13  T:  12/26/2010 12:44:15  Job #:  [id number]  cc:   Acey Lav, MD Fax: [phone]  Vesta Mixer, M.D. Fax: [phone]  Carmin Richmond, MD Fax: [phone]

## 2011-01-04 ENCOUNTER — Telehealth: Payer: Self-pay | Admitting: Cardiovascular Disease

## 2011-01-04 NOTE — Telephone Encounter (Signed)
We can work her in sometime soon.  Change metoprolol to coreg 25 mg BID. That should help with HTN.

## 2011-01-04 NOTE — Telephone Encounter (Signed)
This is Nahser's patient 

## 2011-01-04 NOTE — Telephone Encounter (Signed)
Pt's sister calling re problem with BP, PLS CALL WANTS TO COME IN

## 2011-01-04 NOTE — Telephone Encounter (Signed)
Spoke with pt, her bp has been running 147-185/82-96. Her primary md has increased her metoprolol succ to 50 mg once daily. She takes her meds at night and these bp's are during the day. She feels fine. Will forward for dr Eden Emms review Frances Mendoza

## 2011-01-05 MED ORDER — CARVEDILOL 25 MG PO TABS: 25.0000 mg | ORAL_TABLET | Freq: Two times a day (BID) | ORAL | Status: DC

## 2011-01-05 NOTE — Telephone Encounter (Signed)
Spoke with pt, bp 186/96, coreg called into pharm. Pt will pick up this am and will stop metoprolol. She has an app this am with her pcp Dr Earlene Plater. She will update him with the med change so he can assess. She will call me back this afternoon with bp update. Denies cp/ edema/ sob.

## 2011-01-08 ENCOUNTER — Telehealth: Payer: Self-pay | Admitting: *Deleted

## 2011-01-08 NOTE — Telephone Encounter (Signed)
New med coreg is bringing bp down to 155/84, she saw dr Earlene Plater today and he was ok with new adjustment. i asked her to call Monday 01/15/11 with bp readings plus 3 mo ov set for January and told to call with further questions or concerns, Pt verbalized understanding. Alfonso Ramus RN

## 2011-01-25 ENCOUNTER — Encounter: Payer: Medicare Other | Attending: Physical Medicine & Rehabilitation

## 2011-01-25 ENCOUNTER — Ambulatory Visit: Payer: Medicare Other | Admitting: Physical Medicine & Rehabilitation

## 2011-01-25 ENCOUNTER — Encounter: Payer: Medicare Other | Admitting: Nurse Practitioner

## 2011-01-25 DIAGNOSIS — I76 Septic arterial embolism: Secondary | ICD-10-CM | POA: Insufficient documentation

## 2011-01-25 DIAGNOSIS — I10 Essential (primary) hypertension: Secondary | ICD-10-CM | POA: Insufficient documentation

## 2011-01-25 DIAGNOSIS — B9689 Other specified bacterial agents as the cause of diseases classified elsewhere: Secondary | ICD-10-CM | POA: Insufficient documentation

## 2011-01-25 DIAGNOSIS — R279 Unspecified lack of coordination: Secondary | ICD-10-CM | POA: Insufficient documentation

## 2011-01-25 DIAGNOSIS — R269 Unspecified abnormalities of gait and mobility: Secondary | ICD-10-CM | POA: Insufficient documentation

## 2011-01-25 DIAGNOSIS — I330 Acute and subacute infective endocarditis: Secondary | ICD-10-CM | POA: Insufficient documentation

## 2011-01-25 DIAGNOSIS — R413 Other amnesia: Secondary | ICD-10-CM | POA: Insufficient documentation

## 2011-01-25 DIAGNOSIS — I6340 Cerebral infarction due to embolism of unspecified cerebral artery: Secondary | ICD-10-CM

## 2011-01-25 DIAGNOSIS — I749 Embolism and thrombosis of unspecified artery: Secondary | ICD-10-CM | POA: Insufficient documentation

## 2011-01-25 DIAGNOSIS — Z9181 History of falling: Secondary | ICD-10-CM | POA: Insufficient documentation

## 2011-01-25 DIAGNOSIS — F341 Dysthymic disorder: Secondary | ICD-10-CM

## 2011-01-25 NOTE — Assessment & Plan Note (Signed)
REASON FOR VISIT:  History of cardioembolic stroke.  HISTORY:  A 75 year old female with prior history of hypertension, admitted on October 07, 2010 with confusion, nausea, vomiting, and headaches followed by chest pain.  A 2D echo showed normal ejection fraction, dilated aortic root, calcified mitral anulus, but no other abnormalities.  She was placed on broad-spectrum antibiotics.  Further workup included TEE showing a small mitral vegetation.  Ultimately HACEK organism felt to be the causative factor of the infective endocarditis with cerebral emboli.  The patient has finished up with PT and OT.  She has had home health and outpatient therapy.  Her main complaint right now is that she is having problems with depression.  We started her on Zoloft last visit.  She is less emotionally labile.  However, still depressed and frustrated with the fact that she cannot do for herself like she used to.  Her husband is administering her medications, which makes her feel she does not have her usual independence.  She has tried some limited driving [place]  Has not had any new problems, I spoke to her husband about that he has not noted any safety issues.  She has also had no further falls.  She has some pain in her hip and thigh area when she first gets up, but otherwise this is not something that is keeping her from doing things. She still gets assistance with meal prep, household duties, and shopping, but otherwise is independent.  REVIEW OF SYSTEMS:  Positive for weakness, confusion, depression, anxiety, and poor appetite.  SOCIAL HISTORY:  Married, lives with her husband.  FAMILY HISTORY:  Heart disease, hypertension.  OBJECTIVE:  VITAL SIGNS:  Blood pressure 145/74, pulse 63, respirations 16, O2 saturation 96% on room air. GENERAL:  No acute distress.  Mood and affect appropriate. MUSCULOSKELETAL:  Her lower extremity strength is  normal.  Her upper extremity strength is normal.  She has mild decreased in bilateral upper extremities finger to thumb opposition, fine motor testing.  Her visual fields are intact to confrontation testing.  Extraocular muscles are intact.  Gait is normal.  No evidence of toe drag or knee instability.  IMPRESSION: 1. Cardioembolic, multiple emboli mainly causing decreased memory,     attention, concentration.  She is oriented today, she still has     some short-term memory problems, but otherwise getting back to     baseline.  I do think she can do some limited driving i.e. around     [place]ural community, not on congested street, not on interstate, not     during the night. 2. In terms of her hip pain, I think this is arthritic.  She has some     decreased hip range of motion, internal rotation.  We will ask her     to use Tylenol for this. 3. I will see her back in 1 month after we increased her Zoloft given     that she still has some depressive symptoms;     and if she is still not doing any better and having vegetative     signs would refer her to either Psychology or Psychiatry.     Erick Colace, M.D. Electronically Signed    AEK/MedQ D:  01/25/2011 12:55:58  T:  01/25/2011 13:51:58  Job #:  [id number]  cc:   Acey Lav, MD Fax: [phone]  Carmin Richmond, MD Fax: [phone]  Dr. F[facility]

## 2011-02-05 ENCOUNTER — Telehealth: Payer: Self-pay | Admitting: Cardiovascular Disease

## 2011-02-05 NOTE — Telephone Encounter (Signed)
Does pt need pre ABX for dental/ had endocarditis/ septic embolism, hx poss due to teeth, Informed dentist office i would contact them back tomorrow 02/06/11 with answer.

## 2011-02-05 NOTE — Telephone Encounter (Signed)
New problem:  Please advise on pre-medication. Fax # [phone].

## 2011-02-05 NOTE — Telephone Encounter (Signed)
Yes, most definitely.  She has a hx of endocarditis and will need SBE prophylaxis for the rest of her life.

## 2011-02-06 MED ORDER — CLINDAMYCIN HCL 300 MG PO CAPS: 600.0000 mg | ORAL_CAPSULE | Freq: Once | ORAL | Status: AC

## 2011-02-06 NOTE — Telephone Encounter (Signed)
Called ginger at dentist office, she will contact pt to make aware abx called in for prophylaxis for dental work.

## 2011-02-21 ENCOUNTER — Other Ambulatory Visit: Payer: Medicare Other | Admitting: *Deleted

## 2011-02-27 ENCOUNTER — Ambulatory Visit: Payer: Medicare Other | Admitting: Physical Medicine & Rehabilitation

## 2011-02-27 ENCOUNTER — Telehealth: Payer: Self-pay | Admitting: *Deleted

## 2011-02-27 ENCOUNTER — Other Ambulatory Visit: Payer: Self-pay | Admitting: *Deleted

## 2011-02-27 ENCOUNTER — Ambulatory Visit (INDEPENDENT_AMBULATORY_CARE_PROVIDER_SITE_OTHER): Payer: Medicare Other | Admitting: *Deleted

## 2011-02-27 ENCOUNTER — Encounter: Payer: Medicare Other | Attending: Physical Medicine & Rehabilitation

## 2011-02-27 DIAGNOSIS — I10 Essential (primary) hypertension: Secondary | ICD-10-CM | POA: Insufficient documentation

## 2011-02-27 DIAGNOSIS — F329 Major depressive disorder, single episode, unspecified: Secondary | ICD-10-CM

## 2011-02-27 DIAGNOSIS — I330 Acute and subacute infective endocarditis: Secondary | ICD-10-CM | POA: Insufficient documentation

## 2011-02-27 DIAGNOSIS — I749 Embolism and thrombosis of unspecified artery: Secondary | ICD-10-CM | POA: Insufficient documentation

## 2011-02-27 DIAGNOSIS — E876 Hypokalemia: Secondary | ICD-10-CM

## 2011-02-27 DIAGNOSIS — Z9181 History of falling: Secondary | ICD-10-CM | POA: Insufficient documentation

## 2011-02-27 DIAGNOSIS — I6340 Cerebral infarction due to embolism of unspecified cerebral artery: Secondary | ICD-10-CM

## 2011-02-27 DIAGNOSIS — B9689 Other specified bacterial agents as the cause of diseases classified elsewhere: Secondary | ICD-10-CM | POA: Insufficient documentation

## 2011-02-27 DIAGNOSIS — R279 Unspecified lack of coordination: Secondary | ICD-10-CM | POA: Insufficient documentation

## 2011-02-27 DIAGNOSIS — R413 Other amnesia: Secondary | ICD-10-CM | POA: Insufficient documentation

## 2011-02-27 DIAGNOSIS — I76 Septic arterial embolism: Secondary | ICD-10-CM | POA: Insufficient documentation

## 2011-02-27 DIAGNOSIS — R269 Unspecified abnormalities of gait and mobility: Secondary | ICD-10-CM | POA: Insufficient documentation

## 2011-02-27 DIAGNOSIS — N289 Disorder of kidney and ureter, unspecified: Secondary | ICD-10-CM

## 2011-02-27 LAB — BASIC METABOLIC PANEL
BUN: 13 mg/dL (ref 6–23)
CO2: 29 mEq/L (ref 19–32)
Calcium: 9.5 mg/dL (ref 8.4–10.5)
Chloride: 104 mEq/L (ref 96–112)
Creatinine, Ser: 0.8 mg/dL (ref 0.4–1.2)
Glucose, Bld: 92 mg/dL (ref 70–99)

## 2011-02-27 NOTE — Telephone Encounter (Signed)
PT HERE FOR LABS, NO ORDERS IN CHART, HOSP RECORD REVIEWED AND PT ISN'T FASTING BMET ORDERED

## 2011-02-27 NOTE — Assessment & Plan Note (Signed)
CHIEF COMPLAINT:  Depression and memory problems.  [name] is a 76 year old female with prior history of hypertension admitted October 06, 2009, with confusion, nausea, vomiting, headaches followed by chest pain.  An 2-D echo showed a dilated aortic root, calcified mitral anulus, but no other abnormalities.  She was placed on broad-spectrum antibiotics after TEE showed a small mitral vegetation. Felt to have an infective endocarditis with cerebral emboli bilaterally. She no longer gets any PT or OT or speech therapy.  She really did not have any speech therapy because it was not offered at [facility].  She has had depressive symptoms more than about 6 days a week.  This has improved slightly with Zoloft, titrating upward from 25 mg to 50 mg.  She is in a role community, has resumed [place] the country area, but not in the city as I advised and family has been compliant with monitoring the situation.  She has had no accidents.  She has had no falls.  She has had some back pain, radiating to the lower extremities.  No numbness or tingling.  No bowel or bladder dysfunction. She is following up with her cardiologist Dr. Elease Hashimoto.  MEDICATIONS: 1. Seroquel 12.5 at bedtime. 2. Metoprolol 25 one half p.o. daily. 3. Diazepam 5 mg one half to one p.o. b.i.d. p.r.n. 4. Aspirin 81 mg per day. 5. Zantac OTC 150 mg p.r.n. 6. Florastor 250 b.i.d. 7. Cortisporin otic. 8. Zyrtec 10 mg daily p.r.n. nasal spray. 9. Flonase 2 sprays b.i.d. p.r.n. 10.Also started on carvedilol 25 mg 1 p.o. b.i.d.  ALLERGIES:  MEGACE, PRIMAXIN, CODEINE, PENICILLIN, AMOXICILLIN, CIPRO and TOLECTIN.  PHYSICAL EXAMINATION:  GENERAL:  No acute distress.  Mood and affect appropriate.  She is oriented x3. VITAL SIGNS:  Blood pressure 145/74, pulse 63, respiratory rate is 16 and O2 sat 96% on room air. EXTREMITIES:  Gait is normal.  Upper extremities are 5/5 deltoid, biceps, triceps and grip.  Lower extremities  are 5/5 hip flexion, knee extension and ankle dorsiflexion.  Tone is normal.  Neck range of motion is good.  Lumbar spine range of motion is normal.  Straight leg raising test is negative.  Gait has a slightly widened base support, but otherwise normal.  No evidence of toe drag or knee instability. Mood and affect no emotional lability.  IMPRESSION: 1. Bilateral cerebral emboli primarily cognitive deficits.  She has     had some balance disorder, but this has improved. 2. Emotional lability improved with Zoloft, but still complains of     depression.  Complex situation not only has she had a stroke, but     she has lost multiple family members in the last year. 3. Memory problems.  She cannot remember which medication she is     supposed to take and other day-to-day issues.  However, orientation     has improved.  I would recommend Neuropsychology followup to     evaluate further and advise any other type of treatment. 4. In terms of her medications, I would not change anything.  She may     benefit from a reduced dose or by cutting back on her Valium in     terms of her cognition.     Erick Colace, M.D. Electronically Signed    AEK/MedQ D:  02/27/2011 13:22:20  T:  02/27/2011 22:33:07  Job #:  [id number]  cc:   Vesta Mixer, M.D. Fax: [phone]  Carmin Richmond, MD Fax: [phone]

## 2011-03-06 DIAGNOSIS — F4323 Adjustment disorder with mixed anxiety and depressed mood: Secondary | ICD-10-CM

## 2011-03-06 DIAGNOSIS — I6990 Unspecified sequelae of unspecified cerebrovascular disease: Secondary | ICD-10-CM

## 2011-03-06 DIAGNOSIS — F0630 Mood disorder due to known physiological condition, unspecified: Secondary | ICD-10-CM

## 2011-03-14 ENCOUNTER — Ambulatory Visit: Payer: Medicare Other | Admitting: Cardiovascular Disease

## 2011-03-22 ENCOUNTER — Ambulatory Visit (INDEPENDENT_AMBULATORY_CARE_PROVIDER_SITE_OTHER): Payer: Medicare Other | Admitting: Cardiovascular Disease

## 2011-03-22 ENCOUNTER — Encounter: Payer: Self-pay | Admitting: Cardiovascular Disease

## 2011-03-22 DIAGNOSIS — I38 Endocarditis, valve unspecified: Secondary | ICD-10-CM

## 2011-03-22 NOTE — Progress Notes (Signed)
Frances Mendoza Date of Birth  [birth year] [facility]     [place] [facility]  [address]    [address]   [address] [place], [place]  [address]    [place], [place]  [address] [phone]  Fax  [phone]  [phone]  Fax [phone]   Problem list 1. Endocarditis 2.  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  She was hospitalized in November for CP.  She ruled out for MI. She has had some dyspnea.  Current Outpatient Prescriptions on File Prior to Visit  Medication Sig Dispense Refill  . aspirin 81 MG tablet Take 81 mg by mouth daily.        . carvedilol (COREG) 25 MG tablet Take 1 tablet (25 mg total) by mouth 2 (two) times daily.  60 tablet  5  . diazepam (VALIUM) 5 MG tablet Take 2.5 mg by mouth every 12 (twelve) hours as needed.        . fluticasone (FLONASE) 50 MCG/ACT nasal spray Place 1 spray into the nose 2 (two) times daily.        Marland Kitchen neomycin-polymyxin-gramicidin (NEOSPORIN) ophthalmic solution Place 1 drop into the left ear daily.        . QUEtiapine (SEROQUEL) 25 MG tablet Take 12.5 mg by mouth at bedtime.          Allergies  Allergen Reactions  . Ciprofloxacin     Rash  . Codeine   . Penicillins   . Procardia (Nifedipine)   . Prolactin   . Tolectin (Tolmetin Sodium)     Past Medical History  Diagnosis Date  . Hypertension   . Diverticulosis   . Hemorrhoids, internal   . Anxiety   . Arthritis     osteoarthritis  . Vertigo   . 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 Endocarditis 10/18/2010    treated with vancomycin, gentamicin, and Cipro  . Hypokalemia   . Insomnia   . Renal insufficiency 11/29/2010    Past Surgical History  Procedure Date  . Cholecystectomy 09/29/1999  . Abdominal hysterectomy   . Bladder tuck   . Tee  without cardioversion 10/10/2010     done with suggestion of small mitral vegetation.  ID was consulted for input and ID feels  patient with possible endocarditis with septic emboli    History  Smoking status  . Never Smoker   Smokeless tobacco  . Never Used    History  Alcohol Use No    Family History  Problem Relation Age of Onset  . Coronary artery disease      family history of  . Colon cancer      family history of    Reviw of Systems:  Reviewed in the HPI.  All other systems are negative.  Physical Exam: Blood pressure 143/77, pulse 64, height 5\' 3"  (1.6 m), weight 159 lb 6.4 oz (72.303 kg). General: Well developed, well nourished, in no acute distress.  Head: Normocephalic, atraumatic, sclera non-icteric, mucus membranes are moist,   Neck: Supple. Negative for carotid bruits. JVD not elevated.  Lungs: Clear bilaterally to auscultation without wheezes, rales, or rhonchi. Breathing is unlabored.  Heart: RRR with S1 S2. No murmurs, rubs, or gallops appreciated.  Abdomen: Soft, non-tender, non-distended with  normoactive bowel sounds. No hepatomegaly. No rebound/guarding. No obvious abdominal masses.  Msk:  Strength and tone appear normal for age.  Extremities: No clubbing or cyanosis. No edema.  Distal pedal pulses are 2+ and equal bilaterally.  Her leg pulses are especially good. She has some arthritis in her hands.  Neuro: Alert and oriented X 3. Moves all extremities spontaneously.  Psych:  Responds to questions appropriately with a normal affect.  ECG:   Assessment / Plan:

## 2011-03-22 NOTE — Assessment & Plan Note (Signed)
Frances Mendoza presents today for followup of her endocarditis. She has not had any symptoms of congestive heart failure. Her echocardiogram performed 3 months ago reveals normal left ventricle systolic function. There is no obvious large vegetation.  We will continue to have her take antibiotics prior to getting dental work.

## 2011-03-22 NOTE — Patient Instructions (Signed)
Your physician wants you to follow-up in: 6 months  You will receive a reminder letter in the mail two months in advance. If you don't receive a letter, please call our office to schedule the follow-up appointment.  Your physician recommends that you continue on your current medications as directed. Please refer to the Current Medication list given to you today.  

## 2011-05-25 ENCOUNTER — Ambulatory Visit: Payer: Medicare Other | Admitting: Physical Medicine & Rehabilitation

## 2011-05-29 ENCOUNTER — Ambulatory Visit (HOSPITAL_BASED_OUTPATIENT_CLINIC_OR_DEPARTMENT_OTHER): Payer: Medicare Other | Admitting: Physical Medicine & Rehabilitation

## 2011-05-29 ENCOUNTER — Encounter: Payer: Self-pay | Admitting: Physical Medicine & Rehabilitation

## 2011-05-29 ENCOUNTER — Encounter: Payer: Medicare Other | Attending: Physical Medicine & Rehabilitation

## 2011-05-29 VITALS — BP 124/85 | HR 60 | Resp 14 | Ht 64.0 in | Wt 160.0 lb

## 2011-05-29 DIAGNOSIS — I639 Cerebral infarction, unspecified: Secondary | ICD-10-CM

## 2011-05-29 DIAGNOSIS — B9689 Other specified bacterial agents as the cause of diseases classified elsewhere: Secondary | ICD-10-CM | POA: Insufficient documentation

## 2011-05-29 DIAGNOSIS — I330 Acute and subacute infective endocarditis: Secondary | ICD-10-CM | POA: Insufficient documentation

## 2011-05-29 DIAGNOSIS — R413 Other amnesia: Secondary | ICD-10-CM | POA: Insufficient documentation

## 2011-05-29 DIAGNOSIS — I76 Septic arterial embolism: Secondary | ICD-10-CM | POA: Insufficient documentation

## 2011-05-29 DIAGNOSIS — I749 Embolism and thrombosis of unspecified artery: Secondary | ICD-10-CM | POA: Insufficient documentation

## 2011-05-29 DIAGNOSIS — Z9181 History of falling: Secondary | ICD-10-CM | POA: Insufficient documentation

## 2011-05-29 DIAGNOSIS — R279 Unspecified lack of coordination: Secondary | ICD-10-CM | POA: Insufficient documentation

## 2011-05-29 DIAGNOSIS — R269 Unspecified abnormalities of gait and mobility: Secondary | ICD-10-CM | POA: Insufficient documentation

## 2011-05-29 DIAGNOSIS — I6350 Cerebral infarction due to unspecified occlusion or stenosis of unspecified cerebral artery: Secondary | ICD-10-CM

## 2011-05-29 DIAGNOSIS — I10 Essential (primary) hypertension: Secondary | ICD-10-CM | POA: Insufficient documentation

## 2011-05-29 NOTE — Progress Notes (Signed)
  Subjective:    Patient ID: Frances Mendoza, female    DOB: 03/05/1935, 76 y.o.   MRN: [id number]  HPI no new medical problems.   had initial contact with neuropsychology but did not follow-through on further treatment and evaluation.   continue see primary care physician.  Drove to the office today.  still on Valium 4 panic attacks , continues on sertraline Pain Inventory Average Pain 3 Pain Right Now 0 My pain is aching  In the last 24 hours, has pain interfered with the following? General activity  Relation with others  Enjoyment of life  What TIME of day is your pain at its worst?  Sleep (in general) Good  Pain is worse with: bending, sitting and inactivity Pain improves with: therapy/exercise and medication Relief from Meds: 7  Mobility walk without assistance how many minutes can you walk? 15 mintues + ability to climb steps?  yes do you drive?  yes transfers alone  Function not employed: date last employed 1992  Neuro/Psych weakness depression anxiety  Prior Studies Any changes since last visit?  no  Physicians involved in your care Any changes since last visit?  no    Review of Systems  HENT: Negative.   Eyes: Negative.   Respiratory: Positive for shortness of breath.   Cardiovascular: Negative.   Gastrointestinal: Negative.   Genitourinary: Negative.   Musculoskeletal: Negative.   Skin: Negative.   Neurological: Positive for weakness.  Hematological: Negative.   Psychiatric/Behavioral: Negative.        Objective:   Physical Exam  Constitutional: She is oriented to person, place, and time.  Neurological: She is alert and oriented to person, place, and time. She has normal strength. No sensory deficit. Gait abnormal.        Unable to do tandem gait.  Psychiatric: She has a normal mood and affect.          Assessment & Plan:  1. Bilateral cerebral embolic infarcts.  2. Higher level cognitive deficits as a result.  3. poststroke  depression as well as history of panic attacks that preceded the stroke.  We discussed further treatment options other recommend staying on the sertraline for 6-12 months total. This can be managed by her primary physician.   if the patient decides to followup neuropsychology I could see her back after the treatment. Otherwise I'll see him back on an as-needed basis

## 2011-05-29 NOTE — Patient Instructions (Signed)
Patient will need to decide whether to see neuropsychology. If she decides to see neuropsychology I would like to see her back one more time for followup

## 2011-05-30 ENCOUNTER — Encounter: Payer: Self-pay | Admitting: Physical Medicine & Rehabilitation

## 2011-07-05 ENCOUNTER — Other Ambulatory Visit: Payer: Self-pay | Admitting: Cardiovascular Disease

## 2011-07-05 MED ORDER — CARVEDILOL 25 MG PO TABS: 25.0000 mg | ORAL_TABLET | Freq: Two times a day (BID) | ORAL | Status: DC

## 2011-07-17 ENCOUNTER — Other Ambulatory Visit: Payer: Self-pay

## 2011-07-17 ENCOUNTER — Emergency Department (HOSPITAL_COMMUNITY)
Admission: EM | Admit: 2011-07-17 | Discharge: 2011-07-18 | Disposition: A | Discharge status: Home / Self Care | Payer: Medicare Other | Attending: Emergency Medicine | Admitting: Emergency Medicine

## 2011-07-17 ENCOUNTER — Encounter (HOSPITAL_COMMUNITY): Payer: Self-pay | Admitting: Emergency Medicine

## 2011-07-17 DIAGNOSIS — R197 Diarrhea, unspecified: Secondary | ICD-10-CM | POA: Insufficient documentation

## 2011-07-17 DIAGNOSIS — Z79899 Other long term (current) drug therapy: Secondary | ICD-10-CM | POA: Insufficient documentation

## 2011-07-17 DIAGNOSIS — M129 Arthropathy, unspecified: Secondary | ICD-10-CM | POA: Insufficient documentation

## 2011-07-17 DIAGNOSIS — R42 Dizziness and giddiness: Secondary | ICD-10-CM

## 2011-07-17 DIAGNOSIS — R110 Nausea: Secondary | ICD-10-CM | POA: Insufficient documentation

## 2011-07-17 DIAGNOSIS — Z7982 Long term (current) use of aspirin: Secondary | ICD-10-CM | POA: Insufficient documentation

## 2011-07-17 DIAGNOSIS — H9390 Unspecified disorder of ear, unspecified ear: Secondary | ICD-10-CM | POA: Insufficient documentation

## 2011-07-17 DIAGNOSIS — I10 Essential (primary) hypertension: Secondary | ICD-10-CM | POA: Insufficient documentation

## 2011-07-17 DIAGNOSIS — Z8673 Personal history of transient ischemic attack (TIA), and cerebral infarction without residual deficits: Secondary | ICD-10-CM | POA: Insufficient documentation

## 2011-07-17 DIAGNOSIS — R5381 Other malaise: Secondary | ICD-10-CM | POA: Insufficient documentation

## 2011-07-17 LAB — URINALYSIS, ROUTINE W REFLEX MICROSCOPIC
Bilirubin Urine: NEGATIVE
Ketones, ur: NEGATIVE mg/dL
Nitrite: NEGATIVE
Protein, ur: NEGATIVE mg/dL
pH: 6.5 (ref 5.0–8.0)

## 2011-07-17 LAB — BASIC METABOLIC PANEL
BUN: 15 mg/dL (ref 6–23)
Calcium: 10 mg/dL (ref 8.4–10.5)
Chloride: 102 mEq/L (ref 96–112)
Creatinine, Ser: 0.66 mg/dL (ref 0.50–1.10)
GFR calc Af Amer: >90 mL/min (ref >90)
GFR calc non Af Amer: 85 mL/min — ABNORMAL LOW (ref >90)

## 2011-07-17 LAB — CBC
HCT: 39.6 % (ref 36.0–46.0)
MCH: 30.3 pg (ref 26.0–34.0)
MCHC: 33.3 g/dL (ref 30.0–36.0)
MCV: 90.8 fL (ref 78.0–100.0)
RDW: 14.4 % (ref 11.5–15.5)

## 2011-07-17 LAB — URINE MICROSCOPIC-ADD ON

## 2011-07-17 MED ORDER — SODIUM CHLORIDE 0.9 % IV BOLUS (SEPSIS)
500.0000 mL | Freq: Once | INTRAVENOUS | Status: AC
  Administered 2011-07-17: 500 mL via INTRAVENOUS

## 2011-07-17 MED ORDER — SODIUM CHLORIDE 0.9 % IV SOLN
Freq: Once | INTRAVENOUS | Status: AC
  Administered 2011-07-18: 02:00:00 via INTRAVENOUS

## 2011-07-17 NOTE — ED Notes (Signed)
Patient with dizziness since Sunday night.  Patient has had this before.  No nausea or vomiting.  Patient states she has had strokes in the past.  No facial droop, no slurred speech, equal hand grips and feet pushes.

## 2011-07-17 NOTE — ED Provider Notes (Signed)
History     CSN: [id number]  Arrival date & time 07/17/11  5621   First MD Initiated Contact with Patient 07/17/11 2212      Chief Complaint  Patient presents with  . Dizziness    (Consider location/radiation/quality/duration/timing/severity/associated sxs/prior treatment) The history is provided by the patient and a relative.  76 y/o F with PMH HTN, anxiety, vertigo, CVA, renal insufficiency presents to ED with c/c of dizziness described as unsteadiness and trouble ambulating for the last 3 days. Symptoms similar to prior vertiginous episodes. However, she has had only minimal improvement with meclizine whereas her symptoms are typically completely resolved after 1 day of meclizine therapy. Assoc with diarrhea for 2 days just before dizziness that has been absent since Sunday. Mild nausea without emesis, generalized weakness. Denies fever, chills, HA, vision change, trouble speaking, CP, SOB, abd pain, dysuria, numbness/weakness to specific extremity or body part. Symptoms worse with sitting or standing, rest makes them better.   Past Medical History  Diagnosis Date  . Hypertension   . Diverticulosis   . Hemorrhoids, internal   . Anxiety   . Arthritis     osteoarthritis  . Vertigo   . 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 Endocarditis 10/18/2010    treated with vancomycin, gentamicin, and Cipro  . Hypokalemia   . Insomnia   . Renal insufficiency 11/29/2010    Past Surgical History  Procedure Date  . Cholecystectomy 09/29/1999  . Abdominal hysterectomy   . Bladder tuck   . Tee without cardioversion 10/10/2010     done with suggestion of small mitral vegetation.  ID was consulted for input and ID feels  patient with possible endocarditis with septic emboli    Family History  Problem Relation Age of Onset  . Coronary artery disease      family history of  . Colon cancer      family history  of    History  Substance Use Topics  . Smoking status: Never Smoker   . Smokeless tobacco: Never Used  . Alcohol Use: No     Review of Systems 10 systems reviewed and are negative for acute change except as noted in the HPI.  Allergies  Ciprofloxacin; Codeine; Penicillins; Procardia; Prolactin; and Tolectin  Home Medications   Current Outpatient Rx  Name Route Sig Dispense Refill  . AMINO ACID PO Oral Take by mouth daily.    Marland Kitchen VITAMIN C PO Oral Take 2,000 mg by mouth daily.    . ASPIRIN 81 MG PO TABS Oral Take 81 mg by mouth daily.      Marland Kitchen VITAMIN B COMPLEX PO Oral Take by mouth daily.    Marland Kitchen CARVEDILOL 25 MG PO TABS Oral Take 1 tablet (25 mg total) by mouth 2 (two) times daily. 60 tablet 5    90 day supply is acceptable  . VITAMIN D PO Oral Take 2,000 mg by mouth daily.    Marland Kitchen DIAZEPAM 5 MG PO TABS Oral Take 2.5 mg by mouth every 12 (twelve) hours as needed.      Marland Kitchen FLUTICASONE PROPIONATE 50 MCG/ACT NA SUSP Nasal Place 1 spray into the nose 2 (two) times daily.      Marland Kitchen MECLIZINE HCL 25 MG PO TABS Oral Take 12.5-25 mg by mouth every 8 (eight) hours as needed.    Marland Kitchen PROAIR HFA 108 (90 BASE) MCG/ACT IN AERS      .  QUETIAPINE FUMARATE 25 MG PO TABS Oral Take 12.5 mg by mouth at bedtime.      . SERTRALINE HCL 50 MG PO TABS Oral Take 100 mg by mouth daily.       BP 119/61  Pulse 69  Temp(Src) 98.4 F (36.9 C) (Oral)  Resp 18  Ht 5\' 4"  (1.626 m)  Wt 160 lb (72.576 kg)  BMI 27.46 kg/m2  SpO2 96%  Physical Exam  Nursing note reviewed. Constitutional: She is oriented to person, place, and time. She appears well-developed and well-nourished. No distress.       Vital signs are reviewed and are normal.   HENT:  Head: Normocephalic and atraumatic.  Right Ear: External ear normal.  Left Ear: External ear normal.       Left ear with serous effusion. Hearing intact to finger rub. Oral mucosa moist.  Eyes: Conjunctivae and EOM are normal. Pupils are equal, round, and reactive to light.        No nystagmus. Visual fields full bilaterally.  Neck: Neck supple.       No bruit heard.  Cardiovascular: Normal rate, regular rhythm and normal heart sounds.        Bilateral radial and DP pulses are 2+   Pulmonary/Chest: Effort normal and breath sounds normal. No respiratory distress. She has no wheezes.  Abdominal: Soft. Bowel sounds are normal. She exhibits no distension. There is no tenderness. There is no rebound and no guarding.  Musculoskeletal: She exhibits no edema and no tenderness.  Neurological: She is alert and oriented to person, place, and time. She has normal strength. She displays no tremor. Cranial nerve deficit: 3-12 intact. Sensory deficit: intact to light touch in all extremities distally. She displays a negative Romberg sign. Abnormal coordination: F-N intact bilaterally. Abnormal gait: slow but without ataxia. GCS eye subscore is 4. GCS verbal subscore is 5. GCS motor subscore is 6.  Skin: Skin is warm and dry.  Psychiatric: She has a normal mood and affect.    ED Course  Procedures (including critical care time)  Labs Reviewed  BASIC METABOLIC PANEL - Abnormal; Notable for the following:    Glucose, Bld 109 (*)    GFR calc non Af Amer 85 (*)    All other components within normal limits  URINALYSIS, ROUTINE W REFLEX MICROSCOPIC - Abnormal; Notable for the following:    Leukocytes, UA TRACE (*)    All other components within normal limits  CBC  URINE MICROSCOPIC-ADD ON   No results found.   Date: 07/18/2011  Rate: 62  Rhythm: sinus  QRS Axis: normal  Intervals: normal  ST/T Wave abnormalities: normal  Conduction Disutrbances:first-degree A-V block   Narrative Interpretation: Q-waves to inferior leads, anterior leads seen on prior EKG dated 10/13/2010, 1st degree AV block new   Old EKG Reviewed: changes noted      MDM  Pt with hx both vertigo and CVA. Mild dizziness, no relief with meclizine concerning. Benign HINTS exam, steady gait reassuring.  We will proceed with MRI to eval for posterior stroke. Electrolytes, blood count WNL. No UTI on U/a. Borderline orthostasis, small fluid bolus ordered.    1:33 AM EDP Hyacinth Meeker has been given report on this patient and will continue to monitor overnight.        Shaaron Adler, [place] 07/18/11 [phone]

## 2011-07-17 NOTE — ED Notes (Signed)
Advised patient MRI tech was coming in at 0400.

## 2011-07-18 ENCOUNTER — Emergency Department (HOSPITAL_COMMUNITY): Payer: Medicare Other

## 2011-07-18 MED ORDER — DIAZEPAM 5 MG PO TABS
5.0000 mg | ORAL_TABLET | Freq: Once | ORAL | Status: AC
  Administered 2011-07-18: 5 mg via ORAL
  Filled 2011-07-18: qty 1

## 2011-07-18 MED ORDER — LORAZEPAM 2 MG/ML IJ SOLN
INTRAMUSCULAR | Status: AC
  Administered 2011-07-18: 1 mg via INTRAVENOUS
  Filled 2011-07-18: qty 1

## 2011-07-18 MED ORDER — LORAZEPAM 2 MG/ML IJ SOLN
1.0000 mg | Freq: Once | INTRAMUSCULAR | Status: AC
  Administered 2011-07-18: 1 mg via INTRAVENOUS

## 2011-07-18 NOTE — ED Notes (Signed)
Family at bedside. 

## 2011-07-18 NOTE — ED Provider Notes (Signed)
Medical screening examination/treatment/procedure(s) were conducted as a shared visit with non-physician practitioner(s) and myself.  I personally evaluated the patient during the encounter sup   Cyndra Numbers, MD 07/18/11 [phone]

## 2011-07-18 NOTE — ED Provider Notes (Signed)
Patient with a hx sig for CVA, hypertension, endocarditis, and vertigo presents to the emergency department with the chief complaint of dizziness.  Patient has been taking meclizine without any relief and was placed in CDU on observation by Lorenz Coaster and Dr. Hyacinth Meeker. Patient care resumed from Dr. Hyacinth Meeker .  Patient is here for MR of brain to rule out posterior stroke and has received fluids and Ativan. While in obeservation the pt rested well and had no complaints, per nursing staff. Patient re-evaluated and is resting comfortable, VSS, with no new complaints or concerns at this time. Plan per previous provider is to reassess for disposition after imaging results. On exam: hemodynamically stable, NAD, heart w/ RRR, lungs CTAB, Chest & abd non-tender, no peripheral edema or calf tenderness.  8:53 AM  Patient able to ambulate without any difficulty and has eaten a full meal.  Patient's husband states that she is at her baseline and is comfortable with discharge plan.  Labs and imaging reviewed and discussed with patient.  No evidence of acute infarct found on MR of brain.  Patient advised to followup with primary care physician in regards to today's visit.  Patient verbalizes understanding.  In addition patient complains of allergies and nasal congestion and thinks that her vertigo may be related to this.  Left ear with mild effusion and borderline orthostasis likely etiology for dizziness, no concern for posterior stroke after imaging.  Patient is hemodynamically stable and in no acute distress prior to discharge.  Patient appears reliable source for followup.     Jaci Carrel, [place] 07/18/11 [phone]

## 2011-07-18 NOTE — ED Notes (Signed)
Up to b/r with assist from EMT, husband at side also, pt back to stretcher w/o change or decline, steady gait, states, "the last 2 times i have gotten up i did not feel dizzy and felt better", (denies: pain, dizziness, HA, sob, nausea or other sx).

## 2011-07-18 NOTE — ED Notes (Signed)
Patient is resting comfortably. 

## 2011-07-18 NOTE — ED Notes (Signed)
Pt alert, NAD, calm, interactive, ambulatory to b/r, by w/c to MRI, ativan given for nerves per Dr. Hyacinth Meeker prior to going to MRI. Husband with pt.

## 2011-07-18 NOTE — ED Notes (Signed)
Report received, assumed care.  

## 2011-07-18 NOTE — ED Notes (Signed)
Back from MRI via w/c with husband present at side. Pt alert, NAD, calm, interactive.

## 2011-07-18 NOTE — Discharge Instructions (Signed)
Speak with your primary care physician about taking Zyrtec and Meclozine for dizziness and allergies. Both medications can be found over the counter and can be taken as directed. There is not an interaction with your current medications, but there is a warning for patients over the age of 44 yeas. Read below to learn more about your diagnosis and for reasons to return to the ED. Your MR of the brain was negative for any acute infarct (stroke).  Dizziness Dizziness is a common problem. It is a feeling of unsteadiness or lightheadedness. You may feel like you are about to faint. Dizziness can lead to injury if you stumble or fall. A person of any age group can suffer from dizziness, but dizziness is more common in older adults. CAUSES  Dizziness can be caused by many different things, including:  Middle ear problems.   Standing for too long.   Infections.   An allergic reaction.   Aging.   An emotional response to something, such as the sight of blood.   Side effects of medicines.   Fatigue.   Problems with circulation or blood pressure.   Excess use of alcohol, medicines, or illegal drug use.   Breathing too fast (hyperventilation).   An arrhythmia or problems with your heart rhythm.   Low red blood cell count (anemia).   Pregnancy.   Vomiting, diarrhea, fever, or other illnesses that cause dehydration.   Diseases or conditions such as Parkinson's disease, high blood pressure (hypertension), diabetes, and thyroid problems.   Exposure to extreme heat.  DIAGNOSIS  To find the cause of your dizziness, your caregiver may do a physical exam, lab tests, radiologic imaging scans, or an electrocardiography test (ECG).  TREATMENT  Treatment of dizziness depends on the cause of your symptoms and can vary greatly. HOME CARE INSTRUCTIONS   Drink enough fluids to keep your urine clear or pale yellow. This is especially important in very hot weather. In the elderly, it is also  important in cold weather.   If your dizziness is caused by medicines, take them exactly as directed. When taking blood pressure medicines, it is especially important to get up slowly.   Rise slowly from chairs and steady yourself until you feel okay.   In the morning, first sit up on the side of the bed. When this seems okay, stand slowly while holding onto something until you know your balance is fine.   If you need to stand in one place for a long time, be sure to move your legs often. Tighten and relax the muscles in your legs while standing.   If dizziness continues to be a problem, have someone stay with you for a day or two. Do this until you feel you are well enough to stay alone. Have the person call your caregiver if he or she notices changes in you that are concerning.   Do not drive or use heavy machinery if you feel dizzy.  SEEK IMMEDIATE MEDICAL CARE IF:   Your dizziness or lightheadedness gets worse.   You feel nauseous or vomit.   You develop problems with talking, walking, weakness, or using your arms, hands, or legs.   You are not thinking clearly or you have difficulty forming sentences. It may take a friend or family member to determine if your thinking is normal.   You develop chest pain, abdominal pain, shortness of breath, or sweating.   Your vision changes.   You notice any bleeding.   You have  side effects from medicine that seems to be getting worse rather than better.  MAKE SURE YOU:   Understand these instructions.   Will watch your condition.   Will get help right away if you are not doing well or get worse.  Document Released: 08/01/2000 Document Revised: 01/25/2011 Document Reviewed: 08/25/2010 [facility] Patient Information 2012 [place], [place].

## 2011-07-18 NOTE — ED Provider Notes (Signed)
Medical screening examination/treatment/procedure(s) were performed by non-physician practitioner and as supervising physician I was immediately available for consultation/collaboration.  Hamza Empson, MD 07/18/11 0934 

## 2011-07-19 LAB — URINE CULTURE
Colony Count: 35000
Culture  Setup Time: 201305291159

## 2011-07-27 ENCOUNTER — Telehealth: Payer: Self-pay | Admitting: Cardiovascular Disease

## 2011-07-27 NOTE — Telephone Encounter (Signed)
Walk In pt form " pt Dropped Off Heart & stroke Claim Form/Physicians Statement"  NO ROI or Pmt Collected, sending to [employer] for Completion  07/27/11/KM

## 2011-09-18 ENCOUNTER — Ambulatory Visit (INDEPENDENT_AMBULATORY_CARE_PROVIDER_SITE_OTHER): Payer: Medicare Other | Admitting: Cardiovascular Disease

## 2011-09-18 ENCOUNTER — Encounter: Payer: Self-pay | Admitting: Cardiovascular Disease

## 2011-09-18 VITALS — BP 117/71 | HR 62 | Ht 64.0 in | Wt 172.0 lb

## 2011-09-18 DIAGNOSIS — I10 Essential (primary) hypertension: Secondary | ICD-10-CM

## 2011-09-18 DIAGNOSIS — R252 Cramp and spasm: Secondary | ICD-10-CM

## 2011-09-18 DIAGNOSIS — I2510 Atherosclerotic heart disease of native coronary artery without angina pectoris: Secondary | ICD-10-CM

## 2011-09-18 DIAGNOSIS — E876 Hypokalemia: Secondary | ICD-10-CM

## 2011-09-18 LAB — BASIC METABOLIC PANEL
BUN: 13 mg/dL (ref 6–23)
Calcium: 9.8 mg/dL (ref 8.4–10.5)
GFR: 85.43 mL/min (ref >60.00)
Glucose, Bld: 88 mg/dL (ref 70–99)
Sodium: 141 mEq/L (ref 135–145)

## 2011-09-18 NOTE — Assessment & Plan Note (Signed)
Has history of hypokalemia in the past. We'll check her basic metabolic profile today. He has been having some leg cramping and this may be due to hypokalemia. Her leg pain since I see more like a spinal issue. I've asked her to see her general medical doctor for further evaluation of this.

## 2011-09-18 NOTE — Patient Instructions (Addendum)
HOLD COREG FOR 2 WEEKS TO SEE IF BACK PAIN IMPROVES.  CHECK BP/P REGULARLY   Your physician wants you to follow-up in: 6 MONTHS  You will receive a reminder letter in the mail two months in advance. If you don't receive a letter, please call our office to schedule the follow-up appointment.

## 2011-09-18 NOTE — Assessment & Plan Note (Signed)
She ruled in for myocardial infarction at the time of her endocarditis and stroke. She has not had any episodes of angina. She's not a good candidate for interventional procedures.  If she continues to make good progress in the we will certainly could do further stress testing or cardiac catheterization if needed.  At present she is completely asymptomatic and I do not think that we need to do any further testing at this time. I'll continue to reassess this each time I  see her.   

## 2011-09-18 NOTE — Progress Notes (Signed)
Frances Mendoza Date of Birth  12/10/1935 [facility]     [place] Office  [address]    [address]   [address] [place], [place]  [address]    [place], [place]  [address] [phone]  Fax  [phone]  [phone]  Fax [phone]   Problem list 1. Endocarditis 2.  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  She was hospitalized in November for CP.  She ruled out for MI.  She complains of lots of pain in the back of both hips radiating down to her legs. She thinks may be related to one of her medications. He typically hurts worse when she stands up and walks around. It does not hurt at night when she is lying in bed.  The pain occurs whenever she stands or walks - not when she is lying down.  She denies any cardiac issues.  Current Outpatient Prescriptions on File Prior to Visit  Medication Sig Dispense Refill  . Amino Acids (AMINO ACID PO) Take by mouth daily.      . Ascorbic Acid (VITAMIN C PO) Take 2,000 mg by mouth daily.      Marland Kitchen aspirin 81 MG tablet Take 81 mg by mouth daily.        . B Complex Vitamins (VITAMIN B COMPLEX PO) Take by mouth daily.      . carvedilol (COREG) 25 MG tablet Take 1 tablet (25 mg total) by mouth 2 (two) times daily.  60 tablet  5  . Cholecalciferol (VITAMIN D PO) Take 2,000 mg by mouth daily.      . diazepam (VALIUM) 5 MG tablet Take 2.5 mg by mouth as needed.       . fluticasone (FLONASE) 50 MCG/ACT nasal spray Place 1 spray into the nose 2 (two) times daily.        . meclizine (ANTIVERT) 25 MG tablet Take 12.5-25 mg by mouth every 8 (eight) hours as needed.      Marland Kitchen PROAIR HFA 108 (90 BASE) MCG/ACT inhaler       . QUEtiapine (SEROQUEL) 25 MG tablet Take 12.5 mg by mouth at bedtime.        . sertraline (ZOLOFT) 50 MG tablet Take 100 mg by mouth daily.         Allergies  Allergen Reactions  . Ciprofloxacin      Rash  . Codeine   . Penicillins   . Procardia (Nifedipine)   . Prolactin   . Tolectin (Tolmetin Sodium)     Past Medical History  Diagnosis Date  . Hypertension   . Diverticulosis   . Hemorrhoids, internal   . Anxiety   . Arthritis     osteoarthritis  . Vertigo   . 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 Endocarditis 10/18/2010    treated with vancomycin, gentamicin, and Cipro  . Hypokalemia   . Insomnia   . Renal insufficiency 11/29/2010    Past Surgical History  Procedure Date  . Cholecystectomy 09/29/1999  . Abdominal hysterectomy   . Bladder tuck   . Tee without cardioversion 10/10/2010     done with suggestion of small mitral vegetation.  ID was consulted for input and ID feels  patient with possible endocarditis with septic emboli  History  Smoking status  . Never Smoker   Smokeless tobacco  . Never Used    History  Alcohol Use No    Family History  Problem Relation Age of Onset  . Coronary artery disease      family history of  . Colon cancer      family history of    Reviw of Systems:  Reviewed in the HPI.  All other systems are negative.  Physical Exam: Blood pressure 117/71, pulse 62, height 5\' 4"  (1.626 m), weight 172 lb (78.019 kg). General: Well developed, well nourished, in no acute distress.  Head: Normocephalic, atraumatic, sclera non-icteric, mucus membranes are moist,   Neck: Supple. Negative for carotid bruits. JVD not elevated.  Lungs: Clear bilaterally to auscultation without wheezes, rales, or rhonchi. Breathing is unlabored.  Heart: RRR with S1 S2. No murmurs, rubs, or gallops appreciated.  Abdomen: Soft, non-tender, non-distended with normoactive bowel sounds. No hepatomegaly. No rebound/guarding. No obvious abdominal masses.  Msk:  Strength and tone appear normal for age.  Extremities: No clubbing or cyanosis. No edema.  Distal pedal  pulses are 2+ and equal bilaterally.  Her leg pulses are especially good. She has some arthritis in her hands.  Neuro: Alert and oriented X 3. Moves all extremities spontaneously.  Psych:  Responds to questions appropriately with a normal affect.  ECG:   Assessment / Plan:

## 2011-10-04 ENCOUNTER — Telehealth: Payer: Self-pay | Admitting: Cardiovascular Disease

## 2011-10-04 DIAGNOSIS — I10 Essential (primary) hypertension: Secondary | ICD-10-CM

## 2011-10-04 MED ORDER — POTASSIUM CHLORIDE CRYS ER 10 MEQ PO TBCR: 10.0000 meq | EXTENDED_RELEASE_TABLET | Freq: Every day | ORAL | Status: DC

## 2011-10-04 MED ORDER — HYDROCHLOROTHIAZIDE 25 MG PO TABS: 25.0000 mg | ORAL_TABLET | Freq: Every day | ORAL | Status: DC

## 2011-10-04 NOTE — Telephone Encounter (Signed)
Pt advised and willing to try new meds. Lab date set, meds ordered, pt agreed to plan and will continue to check bp.

## 2011-10-04 NOTE — Telephone Encounter (Signed)
I do not prescribe hygrotin but I will give HCTZ 25 mg a day and Kdur 10 qd. Check bmp in 3 weeks

## 2011-10-04 NOTE — Telephone Encounter (Signed)
New Problem:    Patient called in to report how she has been doing since she stopped taking her carvedilol (COREG) 25 MG tablet for the past two weeks.  Patient believes she should be on some kind of blood pressure medication since she previously had a stroke.  Please call back.

## 2011-10-04 NOTE — Telephone Encounter (Signed)
PT held coreg for two weeks due to leg ache and since stopping pain has greatly improved, BP readings have been 166/90 to lowest x 2 being 128/76, no pulses have been recorded. Pt would like another BP med recommendation, pt said she was on [place]gro[place] 25 mg prior to her stroke and would like to know if that would be appropriate, please review and advise.

## 2011-10-29 ENCOUNTER — Other Ambulatory Visit (INDEPENDENT_AMBULATORY_CARE_PROVIDER_SITE_OTHER): Payer: Medicare Other

## 2011-10-29 DIAGNOSIS — I10 Essential (primary) hypertension: Secondary | ICD-10-CM

## 2011-10-29 LAB — BASIC METABOLIC PANEL
Calcium: 9.9 mg/dL (ref 8.4–10.5)
Creatinine, Ser: 0.6 mg/dL (ref 0.4–1.2)

## 2011-11-12 ENCOUNTER — Ambulatory Visit (INDEPENDENT_AMBULATORY_CARE_PROVIDER_SITE_OTHER): Payer: Medicare Other | Admitting: Infectious Disease

## 2011-11-12 ENCOUNTER — Telehealth: Payer: Self-pay | Admitting: *Deleted

## 2011-11-12 ENCOUNTER — Encounter: Payer: Self-pay | Admitting: Infectious Disease

## 2011-11-12 VITALS — BP 166/89 | HR 77 | Temp 98.0°F | Wt 171.0 lb

## 2011-11-12 DIAGNOSIS — T50904A Poisoning by unspecified drugs, medicaments and biological substances, undetermined, initial encounter: Secondary | ICD-10-CM

## 2011-11-12 DIAGNOSIS — L0390 Cellulitis, unspecified: Secondary | ICD-10-CM | POA: Insufficient documentation

## 2011-11-12 DIAGNOSIS — L270 Generalized skin eruption due to drugs and medicaments taken internally: Secondary | ICD-10-CM

## 2011-11-12 DIAGNOSIS — K1370 Unspecified lesions of oral mucosa: Secondary | ICD-10-CM

## 2011-11-12 DIAGNOSIS — L0291 Cutaneous abscess, unspecified: Secondary | ICD-10-CM

## 2011-11-12 MED ORDER — DOXYCYCLINE HYCLATE 100 MG PO TABS: 100.0000 mg | ORAL_TABLET | Freq: Two times a day (BID) | ORAL | Status: DC

## 2011-11-12 NOTE — Assessment & Plan Note (Signed)
Looks c/o apthous ulcer. Will watch

## 2011-11-12 NOTE — Assessment & Plan Note (Signed)
Sp successful course of therapy

## 2011-11-12 NOTE — Telephone Encounter (Signed)
Lesion on roof of mouth started 11/09/11.  "Blister" opened 11/11/11.  Pt and husband worried that this lesion may lead to the problems experienced August 2012.  Requesting appt.  Given appt for this afternoon with Dr Daiva Eves.

## 2011-11-12 NOTE — Progress Notes (Signed)
Subjective:    Patient ID: Frances Mendoza, female    DOB: 04-Dec-1935, 76 y.o.   MRN: [id number]  HPI  44 -year-old lady who had a quite complicated hospital stay in August into September of 2012. She was admitted with altered mental status and found to have a lesions on MRI imaging of the brain consistent with possible septic emboli. Her blood cultures on admission were actually sterile. She did undergo a transesophageal echocardiogram which showed evidence of a vegetation on the mitral valve. Her course was complicated by intubation and treatment for ventilator associated pneumonia. Patient was treated broadly with vancomycin and Primaxin a long with gentamicin. Once she finished therapy for healthcare associated pneumonia she was continued on bag vancomycin gentamicin and then ciprofloxacin. Serologies were sent for Bartonella as well as Coxiella to look for a possible culture-negative endocarditis pathogen. These were all negative on initial testing and repeat test in one month later. She completed a month of therapy with vancomycin and gentamicin as well as a month of therapy with ciprofloxacin. Repeat MRI showed resolution of her prior areas of septic emboli in the brain. She was discharged to home and continued on oral ciprofloxacin to complete that part of her culture-negative endocarditis regimen.she then developed a diffuse erythematous rash. He was apparently told to continue the ciprofloxacin despite this evolving drug rash and she developed a severe drug rash with exfoliation or shins ofher skin. The ciprofloxacin was ultimately discontinued on November 10, 2010. I last saw her in October 2012. She asked to be seen today in clinic urgently because she has developed a sore area on the roof of her mouth and she was concerned that something similar had heralded her endocarditis. She also was recently given bactrim after sustaining a laceration with wood products in right shin. She apparently saw a  surgeon who extracted the residual foreign material. She was being given bactrim DS but is now concerned that she is developing an allergic reaction as she has erythema on her palms.       Review of Systems  Constitutional: Negative for fever, chills, diaphoresis, activity change, appetite change, fatigue and unexpected weight change.  HENT: Positive for sore throat and mouth sores. Negative for congestion, rhinorrhea, sneezing, trouble swallowing and sinus pressure.   Eyes: Negative for photophobia and visual disturbance.  Respiratory: Negative for cough, chest tightness, shortness of breath, wheezing and stridor.   Cardiovascular: Negative for chest pain, palpitations and leg swelling.  Gastrointestinal: Negative for nausea, vomiting, abdominal pain, diarrhea, constipation, blood in stool, abdominal distention and anal bleeding.  Genitourinary: Negative for dysuria, hematuria, flank pain and difficulty urinating.  Musculoskeletal: Negative for myalgias, back pain, joint swelling, arthralgias and gait problem.  Skin: Positive for wound. Negative for color change, pallor and rash.  Neurological: Negative for dizziness, tremors, weakness and light-headedness.  Hematological: Negative for adenopathy. Does not bruise/bleed easily.  Psychiatric/Behavioral: Negative for behavioral problems, confusion, disturbed wake/sleep cycle, dysphoric mood, decreased concentration and agitation.       Objective:   Physical Exam  Constitutional: She is oriented to person, place, and time. She appears well-developed and well-nourished. No distress.  HENT:  Head: Normocephalic and atraumatic.  Mouth/Throat: Oropharynx is clear and moist. No oropharyngeal exudate.    Eyes: Conjunctivae normal and EOM are normal. Pupils are equal, round, and reactive to light. No scleral icterus.  Neck: Normal range of motion. Neck supple.  Cardiovascular: Normal rate and regular rhythm.   Pulmonary/Chest: Effort normal  and breath  sounds normal. No respiratory distress. She has no wheezes.  Abdominal: She exhibits no distension and no mass. There is no tenderness.  Musculoskeletal: She exhibits edema. She exhibits no tenderness.  Lymphadenopathy:    She has no cervical adenopathy.  Neurological: She is alert and oriented to person, place, and time. She has normal reflexes. She exhibits normal muscle tone. Coordination normal.  Skin: Skin is warm and dry. She is not diaphoretic. No erythema. No pallor.     Psychiatric: She has a normal mood and affect. Her behavior is normal. Judgment and thought content normal.          Assessment & Plan:  Oral lesion Looks c/o apthous ulcer. Will watch  Cellulitis Give her doxycyline for 7 more days.  Endocarditis NEC Sp successful course of therapy  Drug rash ? Having beginnings of this to TMP/SMX. Her list of allergies is rather extensive and getting to the point where she has little options for abx when she might need them

## 2011-11-12 NOTE — Assessment & Plan Note (Signed)
Give her doxycyline for 7 more days.

## 2011-11-12 NOTE — Assessment & Plan Note (Signed)
?   Having beginnings of this to TMP/SMX. Her list of allergies is rather extensive and getting to the point where she has little options for abx when she might need them

## 2011-12-12 ENCOUNTER — Ambulatory Visit (INDEPENDENT_AMBULATORY_CARE_PROVIDER_SITE_OTHER): Payer: Medicare Other | Admitting: Infectious Disease

## 2011-12-12 ENCOUNTER — Encounter: Payer: Self-pay | Admitting: Infectious Disease

## 2011-12-12 VITALS — BP 129/82 | HR 80 | Temp 97.9°F | Wt 176.5 lb

## 2011-12-12 DIAGNOSIS — I38 Endocarditis, valve unspecified: Secondary | ICD-10-CM

## 2011-12-12 DIAGNOSIS — L0291 Cutaneous abscess, unspecified: Secondary | ICD-10-CM

## 2011-12-12 DIAGNOSIS — K1370 Unspecified lesions of oral mucosa: Secondary | ICD-10-CM

## 2011-12-12 DIAGNOSIS — K121 Other forms of stomatitis: Secondary | ICD-10-CM

## 2011-12-12 DIAGNOSIS — L0390 Cellulitis, unspecified: Secondary | ICD-10-CM

## 2011-12-12 NOTE — Progress Notes (Signed)
Subjective:    Patient ID: Frances Mendoza, female    DOB: [birth year], 76 y.o.   MRN: [id number]  HPI  50 -year-old lady who had a quite complicated hospital stay in August into September of 2012. She was admitted with altered mental status and found to have a lesions on MRI imaging of the brain consistent with possible septic emboli. Her blood cultures on admission were actually sterile. She did undergo a transesophageal echocardiogram which showed evidence of a vegetation on the mitral valve. Her course was complicated by intubation and treatment for ventilator associated pneumonia. Patient was treated broadly with vancomycin and Primaxin a long with gentamicin. Once she finished therapy for healthcare associated pneumonia she was continued on bag vancomycin gentamicin and then ciprofloxacin. Serologies were sent for Bartonella as well as Coxiella to look for a possible culture-negative endocarditis pathogen. These were all negative on initial testing and repeat test in one month later. She completed a month of therapy with vancomycin and gentamicin as well as a month of therapy with ciprofloxacin. Repeat MRI showed resolution of her prior areas of septic emboli in the brain. She was discharged to home and continued on oral ciprofloxacin to complete that part of her culture-negative endocarditis regimen.she then developed a diffuse erythematous rash. He was apparently told to continue the ciprofloxacin despite this evolving drug rash and she developed a severe drug rash with exfoliation or shins ofher skin. The ciprofloxacin was ultimately discontinued on November 10, 2010. I last saw her in October 2012. She asked to be seen today in September urgently because she has developed a sore area on the roof of her mouth and she was concerned that something similar had heralded her endocarditis. She also was recently given bactrim after sustaining a laceration with wood products in right shin. She apparently  saw a surgeon who extracted the residual foreign material. She was being given bactrim DS but is now concerned that she is developing an allergic reaction as she has erythema on her palms. I tried to reassure her about the lesion in her mouth and changed her oral antibiotics to doxycycline. She's been back to see the surgeon who is removed additional material and has suture the wounds shut.   Review of Systems  Constitutional: Negative for fever, chills, diaphoresis, activity change, appetite change, fatigue and unexpected weight change.  HENT: Negative for congestion, sore throat, rhinorrhea, sneezing, trouble swallowing and sinus pressure.   Eyes: Negative for photophobia and visual disturbance.  Respiratory: Negative for cough, chest tightness, shortness of breath, wheezing and stridor.   Cardiovascular: Negative for chest pain, palpitations and leg swelling.  Gastrointestinal: Negative for nausea, vomiting, abdominal pain, diarrhea, constipation, blood in stool, abdominal distention and anal bleeding.  Genitourinary: Negative for dysuria, hematuria, flank pain and difficulty urinating.  Musculoskeletal: Negative for myalgias, back pain, joint swelling, arthralgias and gait problem.  Skin: Positive for wound. Negative for color change, pallor and rash.  Neurological: Negative for dizziness, tremors, weakness and light-headedness.  Hematological: Negative for adenopathy. Does not bruise/bleed easily.  Psychiatric/Behavioral: Negative for behavioral problems, confusion, disturbed wake/sleep cycle, dysphoric mood, decreased concentration and agitation.       Objective:   Physical Exam  Constitutional: She is oriented to person, place, and time. She appears well-nourished. No distress.  HENT:  Head: Normocephalic and atraumatic.  Mouth/Throat: Oropharynx is clear and moist. No oropharyngeal exudate.    Eyes: Conjunctivae normal and EOM are normal. Pupils are equal, round, and reactive to  light.  No scleral icterus.  Neck: Normal range of motion. Neck supple. No JVD present.  Cardiovascular: Normal rate, regular rhythm and normal heart sounds.  Exam reveals no gallop and no friction rub.   No murmur heard. Pulmonary/Chest: Effort normal and breath sounds normal. No respiratory distress. She has no wheezes. She has no rales. She exhibits no tenderness.  Abdominal: She exhibits no distension and no mass. There is no tenderness. There is no rebound and no guarding.  Musculoskeletal: She exhibits no edema and no tenderness.       Legs: Lymphadenopathy:    She has no cervical adenopathy.  Neurological: She is alert and oriented to person, place, and time. She has normal reflexes. She exhibits normal muscle tone. Coordination normal.  Skin: Skin is warm and dry. She is not diaphoretic. No erythema. No pallor.  Psychiatric: She has a normal mood and affect. Her behavior is normal. Judgment and thought content normal.          Assessment & Plan:   Recent soft tissue infection of the leg: She is back on doxycycline prescribed by her surgeon. She can finish this course the leg does not appear grossly infected this point time.  Sore the roof of her mouth: Differential could include herpes simplex type I though I would've expected it to have resolved by now. I like her to followup with her dentist for further tracking of this lesion it does not appear malignant to my eye. I would consider continuing simple symptomatic care.  History of endocarditis no evidence of recurrence.

## 2012-01-02 ENCOUNTER — Other Ambulatory Visit: Payer: Self-pay | Admitting: Neurosurgery

## 2012-01-02 DIAGNOSIS — M549 Dorsalgia, unspecified: Secondary | ICD-10-CM

## 2012-01-02 DIAGNOSIS — M5410 Radiculopathy, site unspecified: Secondary | ICD-10-CM

## 2012-01-10 ENCOUNTER — Ambulatory Visit
Admission: RE | Admit: 2012-01-10 | Discharge: 2012-01-10 | Disposition: A | Discharge status: Home / Self Care | Payer: Medicare Other | Source: Ambulatory Visit | Attending: Neurosurgery | Admitting: Neurosurgery

## 2012-01-10 VITALS — BP 151/87 | HR 92

## 2012-01-10 DIAGNOSIS — M5410 Radiculopathy, site unspecified: Secondary | ICD-10-CM

## 2012-01-10 DIAGNOSIS — M549 Dorsalgia, unspecified: Secondary | ICD-10-CM

## 2012-01-10 MED ORDER — ONDANSETRON HCL 4 MG/2ML IJ SOLN
4.0000 mg | Freq: Once | INTRAMUSCULAR | Status: AC
  Administered 2012-01-10: 4 mg via INTRAMUSCULAR

## 2012-01-10 MED ORDER — DIAZEPAM 5 MG PO TABS
5.0000 mg | ORAL_TABLET | Freq: Once | ORAL | Status: AC
  Administered 2012-01-10: 5 mg via ORAL

## 2012-01-10 MED ORDER — IOHEXOL 180 MG/ML  SOLN
18.0000 mL | Freq: Once | INTRAMUSCULAR | Status: AC | PRN
  Administered 2012-01-10: 18 mL via INTRATHECAL

## 2012-01-10 MED ORDER — MEPERIDINE HCL 100 MG/ML IJ SOLN
75.0000 mg | Freq: Once | INTRAMUSCULAR | Status: AC
  Administered 2012-01-10: 75 mg via INTRAMUSCULAR

## 2012-01-10 NOTE — Progress Notes (Signed)
Patient states she has been off Sertraline for at least the past two days.  Donell Sievert, RN

## 2012-01-10 NOTE — Progress Notes (Signed)
Went to lobby to speak with daughter and she is not there at the present time.dd

## 2012-02-06 ENCOUNTER — Other Ambulatory Visit: Payer: Self-pay | Admitting: Neurosurgery

## 2012-02-06 DIAGNOSIS — M549 Dorsalgia, unspecified: Secondary | ICD-10-CM

## 2012-02-06 DIAGNOSIS — M5410 Radiculopathy, site unspecified: Secondary | ICD-10-CM

## 2012-02-08 ENCOUNTER — Ambulatory Visit
Admission: RE | Admit: 2012-02-08 | Discharge: 2012-02-08 | Disposition: A | Discharge status: Home / Self Care | Payer: Medicare Other | Source: Ambulatory Visit | Attending: Neurosurgery | Admitting: Neurosurgery

## 2012-02-08 ENCOUNTER — Other Ambulatory Visit: Payer: Medicare Other

## 2012-02-08 VITALS — BP 160/74 | HR 91

## 2012-02-08 DIAGNOSIS — M549 Dorsalgia, unspecified: Secondary | ICD-10-CM

## 2012-02-08 DIAGNOSIS — M5410 Radiculopathy, site unspecified: Secondary | ICD-10-CM

## 2012-02-08 MED ORDER — DIAZEPAM 5 MG PO TABS
5.0000 mg | ORAL_TABLET | Freq: Once | ORAL | Status: AC
  Administered 2012-02-08: 5 mg via ORAL

## 2012-02-08 MED ORDER — METHYLPREDNISOLONE ACETATE 40 MG/ML INJ SUSP (RADIOLOG
120.0000 mg | Freq: Once | INTRAMUSCULAR | Status: AC
  Administered 2012-02-08: 120 mg via EPIDURAL

## 2012-02-08 MED ORDER — DIPHENHYDRAMINE HCL 25 MG PO CAPS
25.0000 mg | ORAL_CAPSULE | Freq: Once | ORAL | Status: AC
  Administered 2012-02-08: 25 mg via ORAL

## 2012-02-08 MED ORDER — IOHEXOL 180 MG/ML  SOLN
1.0000 mL | Freq: Once | INTRAMUSCULAR | Status: AC | PRN
  Administered 2012-02-08: 1 mL via EPIDURAL

## 2012-02-08 NOTE — Progress Notes (Signed)
Pt given benadryl due to skin rash after myelo and valium due to being nervous.

## 2012-04-15 ENCOUNTER — Other Ambulatory Visit: Payer: Self-pay | Admitting: Neurosurgery

## 2012-04-15 DIAGNOSIS — M5410 Radiculopathy, site unspecified: Secondary | ICD-10-CM

## 2012-04-15 DIAGNOSIS — M549 Dorsalgia, unspecified: Secondary | ICD-10-CM

## 2012-04-16 ENCOUNTER — Ambulatory Visit
Admission: RE | Admit: 2012-04-16 | Discharge: 2012-04-16 | Disposition: A | Discharge status: Home / Self Care | Payer: Medicare Other | Source: Ambulatory Visit | Attending: Neurosurgery | Admitting: Neurosurgery

## 2012-04-16 VITALS — BP 165/89 | HR 78

## 2012-04-16 DIAGNOSIS — M5126 Other intervertebral disc displacement, lumbar region: Secondary | ICD-10-CM

## 2012-04-16 DIAGNOSIS — M549 Dorsalgia, unspecified: Secondary | ICD-10-CM

## 2012-04-16 DIAGNOSIS — M5410 Radiculopathy, site unspecified: Secondary | ICD-10-CM

## 2012-04-16 MED ORDER — DIPHENHYDRAMINE HCL 25 MG PO CAPS
25.0000 mg | ORAL_CAPSULE | Freq: Once | ORAL | Status: AC
  Administered 2012-04-16: 25 mg via ORAL

## 2012-04-16 MED ORDER — IOHEXOL 180 MG/ML  SOLN
1.0000 mL | Freq: Once | INTRAMUSCULAR | Status: AC | PRN
  Administered 2012-04-16: 1 mL via EPIDURAL

## 2012-04-16 MED ORDER — METHYLPREDNISOLONE ACETATE 40 MG/ML INJ SUSP (RADIOLOG
120.0000 mg | Freq: Once | INTRAMUSCULAR | Status: AC
  Administered 2012-04-16: 120 mg via EPIDURAL

## 2012-05-02 ENCOUNTER — Other Ambulatory Visit: Payer: Self-pay

## 2012-05-02 DIAGNOSIS — I10 Essential (primary) hypertension: Secondary | ICD-10-CM

## 2012-05-02 MED ORDER — POTASSIUM CHLORIDE CRYS ER 10 MEQ PO TBCR: 10.0000 meq | EXTENDED_RELEASE_TABLET | Freq: Every day | ORAL | Status: DC

## 2012-05-02 MED ORDER — HYDROCHLOROTHIAZIDE 25 MG PO TABS: 25.0000 mg | ORAL_TABLET | Freq: Every day | ORAL | Status: DC

## 2012-05-19 ENCOUNTER — Other Ambulatory Visit: Payer: Self-pay | Admitting: Neurosurgery

## 2012-05-19 DIAGNOSIS — M5410 Radiculopathy, site unspecified: Secondary | ICD-10-CM

## 2012-05-19 DIAGNOSIS — M549 Dorsalgia, unspecified: Secondary | ICD-10-CM

## 2012-05-22 ENCOUNTER — Other Ambulatory Visit: Payer: Medicare Other

## 2012-05-23 ENCOUNTER — Encounter (HOSPITAL_COMMUNITY)
Admission: AD | Disposition: A | Discharge status: Home / Self Care | Payer: Self-pay | Source: Ambulatory Visit | Attending: Cardiovascular Disease

## 2012-05-23 ENCOUNTER — Encounter: Payer: Self-pay | Admitting: Cardiovascular Disease

## 2012-05-23 ENCOUNTER — Inpatient Hospital Stay (HOSPITAL_COMMUNITY): Payer: Medicare Other

## 2012-05-23 ENCOUNTER — Encounter (HOSPITAL_COMMUNITY): Payer: Self-pay | Admitting: *Deleted

## 2012-05-23 ENCOUNTER — Inpatient Hospital Stay (HOSPITAL_COMMUNITY)
Admission: AD | Admit: 2012-05-23 | Discharge: 2012-05-24 | DRG: 287 | Disposition: A | Discharge status: Home / Self Care | Payer: Medicare Other | Source: Ambulatory Visit | Attending: Cardiovascular Disease | Admitting: Cardiovascular Disease

## 2012-05-23 ENCOUNTER — Ambulatory Visit (INDEPENDENT_AMBULATORY_CARE_PROVIDER_SITE_OTHER): Payer: Medicare Other | Admitting: Cardiovascular Disease

## 2012-05-23 VITALS — BP 146/90 | HR 81 | Ht 64.0 in | Wt 174.1 lb

## 2012-05-23 DIAGNOSIS — Z91041 Radiographic dye allergy status: Secondary | ICD-10-CM

## 2012-05-23 DIAGNOSIS — Z8673 Personal history of transient ischemic attack (TIA), and cerebral infarction without residual deficits: Secondary | ICD-10-CM

## 2012-05-23 DIAGNOSIS — Z888 Allergy status to other drugs, medicaments and biological substances status: Secondary | ICD-10-CM

## 2012-05-23 DIAGNOSIS — I2510 Atherosclerotic heart disease of native coronary artery without angina pectoris: Secondary | ICD-10-CM

## 2012-05-23 DIAGNOSIS — R252 Cramp and spasm: Secondary | ICD-10-CM | POA: Diagnosis present

## 2012-05-23 DIAGNOSIS — M549 Dorsalgia, unspecified: Secondary | ICD-10-CM | POA: Diagnosis present

## 2012-05-23 DIAGNOSIS — I6350 Cerebral infarction due to unspecified occlusion or stenosis of unspecified cerebral artery: Secondary | ICD-10-CM

## 2012-05-23 DIAGNOSIS — T502X5A Adverse effect of carbonic-anhydrase inhibitors, benzothiadiazides and other diuretics, initial encounter: Secondary | ICD-10-CM | POA: Diagnosis present

## 2012-05-23 DIAGNOSIS — I200 Unstable angina: Secondary | ICD-10-CM | POA: Diagnosis present

## 2012-05-23 DIAGNOSIS — F411 Generalized anxiety disorder: Secondary | ICD-10-CM | POA: Diagnosis present

## 2012-05-23 DIAGNOSIS — Z7982 Long term (current) use of aspirin: Secondary | ICD-10-CM

## 2012-05-23 DIAGNOSIS — I639 Cerebral infarction, unspecified: Secondary | ICD-10-CM

## 2012-05-23 DIAGNOSIS — K5730 Diverticulosis of large intestine without perforation or abscess without bleeding: Secondary | ICD-10-CM | POA: Diagnosis present

## 2012-05-23 DIAGNOSIS — Z8249 Family history of ischemic heart disease and other diseases of the circulatory system: Secondary | ICD-10-CM

## 2012-05-23 DIAGNOSIS — I10 Essential (primary) hypertension: Secondary | ICD-10-CM

## 2012-05-23 DIAGNOSIS — E785 Hyperlipidemia, unspecified: Secondary | ICD-10-CM | POA: Diagnosis present

## 2012-05-23 DIAGNOSIS — Z880 Allergy status to penicillin: Secondary | ICD-10-CM

## 2012-05-23 DIAGNOSIS — Z881 Allergy status to other antibiotic agents status: Secondary | ICD-10-CM

## 2012-05-23 DIAGNOSIS — G8929 Other chronic pain: Secondary | ICD-10-CM | POA: Diagnosis present

## 2012-05-23 DIAGNOSIS — R079 Chest pain, unspecified: Secondary | ICD-10-CM

## 2012-05-23 DIAGNOSIS — Z800 Family history of malignant neoplasm of digestive organs: Secondary | ICD-10-CM

## 2012-05-23 DIAGNOSIS — E876 Hypokalemia: Secondary | ICD-10-CM | POA: Diagnosis present

## 2012-05-23 HISTORY — PX: LEFT HEART CATHETERIZATION WITH CORONARY ANGIOGRAM: SHX5451

## 2012-05-23 HISTORY — DX: Hyperlipidemia, unspecified: E78.5

## 2012-05-23 HISTORY — DX: Atherosclerotic heart disease of native coronary artery without angina pectoris: I25.10

## 2012-05-23 LAB — CBC
HCT: 40.7 % (ref 36.0–46.0)
MCHC: 34.6 g/dL (ref 30.0–36.0)
RDW: 13.9 % (ref 11.5–15.5)

## 2012-05-23 LAB — PROTIME-INR: INR: 1.02 (ref 0.00–1.49)

## 2012-05-23 LAB — BASIC METABOLIC PANEL
BUN: 8 mg/dL (ref 6–23)
Calcium: 10.2 mg/dL (ref 8.4–10.5)
Chloride: 103 mEq/L (ref 96–112)
Creatinine, Ser: 0.63 mg/dL (ref 0.50–1.10)
GFR calc Af Amer: >90 mL/min (ref >90)
GFR calc non Af Amer: 86 mL/min — ABNORMAL LOW (ref >90)

## 2012-05-23 SURGERY — LEFT HEART CATHETERIZATION WITH CORONARY ANGIOGRAM
Anesthesia: LOCAL

## 2012-05-23 MED ORDER — SODIUM CHLORIDE 0.9 % IJ SOLN
3.0000 mL | Freq: Two times a day (BID) | INTRAMUSCULAR | Status: DC
  Administered 2012-05-23: 3 mL via INTRAVENOUS

## 2012-05-23 MED ORDER — SERTRALINE HCL 100 MG PO TABS
100.0000 mg | ORAL_TABLET | Freq: Every day | ORAL | Status: DC
  Administered 2012-05-24: 09:00:00 100 mg via ORAL
  Filled 2012-05-23: qty 1

## 2012-05-23 MED ORDER — ALBUTEROL SULFATE HFA 108 (90 BASE) MCG/ACT IN AERS
2.0000 | INHALATION_SPRAY | Freq: Four times a day (QID) | RESPIRATORY_TRACT | Status: DC | PRN
  Filled 2012-05-23: qty 6.7

## 2012-05-23 MED ORDER — HEPARIN BOLUS VIA INFUSION
3000.0000 [IU] | Freq: Once | INTRAVENOUS | Status: AC
  Administered 2012-05-23: 3000 [IU] via INTRAVENOUS
  Filled 2012-05-23: qty 3000

## 2012-05-23 MED ORDER — B COMPLEX-C PO TABS
1.0000 | ORAL_TABLET | Freq: Every day | ORAL | Status: DC
  Administered 2012-05-24: 09:00:00 1 via ORAL
  Filled 2012-05-23 (×2): qty 1

## 2012-05-23 MED ORDER — HYDROCHLOROTHIAZIDE 25 MG PO TABS
25.0000 mg | ORAL_TABLET | Freq: Every day | ORAL | Status: DC
  Administered 2012-05-24: 09:00:00 25 mg via ORAL
  Filled 2012-05-23 (×2): qty 1

## 2012-05-23 MED ORDER — VERAPAMIL HCL 2.5 MG/ML IV SOLN
INTRAVENOUS | Status: AC
  Filled 2012-05-23: qty 2

## 2012-05-23 MED ORDER — ALPRAZOLAM 0.25 MG PO TABS: 0.2500 mg | ORAL_TABLET | Freq: Two times a day (BID) | ORAL | Status: DC | PRN

## 2012-05-23 MED ORDER — CHOLECALCIFEROL 10 MCG (400 UNIT) PO TABS
400.0000 [IU] | ORAL_TABLET | Freq: Every day | ORAL | Status: DC
  Administered 2012-05-24: 400 [IU] via ORAL
  Filled 2012-05-23 (×2): qty 1

## 2012-05-23 MED ORDER — MIDAZOLAM HCL 2 MG/2ML IJ SOLN
INTRAMUSCULAR | Status: AC
  Filled 2012-05-23: qty 2

## 2012-05-23 MED ORDER — SODIUM CHLORIDE 0.9 % IJ SOLN: 3.0000 mL | INTRAMUSCULAR | Status: DC | PRN

## 2012-05-23 MED ORDER — ASPIRIN 81 MG PO TABS: 81.0000 mg | ORAL_TABLET | Freq: Every day | ORAL | Status: DC

## 2012-05-23 MED ORDER — ONDANSETRON HCL 4 MG/2ML IJ SOLN: 4.0000 mg | Freq: Four times a day (QID) | INTRAMUSCULAR | Status: DC | PRN

## 2012-05-23 MED ORDER — HEPARIN (PORCINE) IN NACL 2-0.9 UNIT/ML-% IJ SOLN
INTRAMUSCULAR | Status: AC
  Filled 2012-05-23: qty 1000

## 2012-05-23 MED ORDER — FAMOTIDINE IN NACL 20-0.9 MG/50ML-% IV SOLN
INTRAVENOUS | Status: AC
  Filled 2012-05-23: qty 50

## 2012-05-23 MED ORDER — METHYLPREDNISOLONE SODIUM SUCC 125 MG IJ SOLR
INTRAMUSCULAR | Status: AC
  Filled 2012-05-23: qty 2

## 2012-05-23 MED ORDER — ZOLPIDEM TARTRATE 5 MG PO TABS: 5.0000 mg | ORAL_TABLET | Freq: Every evening | ORAL | Status: DC | PRN

## 2012-05-23 MED ORDER — NITROGLYCERIN 0.4 MG SL SUBL: 0.4000 mg | SUBLINGUAL_TABLET | SUBLINGUAL | Status: DC | PRN

## 2012-05-23 MED ORDER — LIDOCAINE HCL (PF) 1 % IJ SOLN
INTRAMUSCULAR | Status: AC
  Filled 2012-05-23: qty 30

## 2012-05-23 MED ORDER — SODIUM CHLORIDE 0.9 % IV SOLN: 250.0000 mL | INTRAVENOUS | Status: DC | PRN

## 2012-05-23 MED ORDER — HEPARIN (PORCINE) IN NACL 100-0.45 UNIT/ML-% IJ SOLN
800.0000 [IU]/h | INTRAMUSCULAR | Status: DC
  Administered 2012-05-23: 800 [IU]/h via INTRAVENOUS
  Filled 2012-05-23: qty 250

## 2012-05-23 MED ORDER — POTASSIUM CHLORIDE CRYS ER 10 MEQ PO TBCR
10.0000 meq | EXTENDED_RELEASE_TABLET | Freq: Every day | ORAL | Status: DC
  Administered 2012-05-24: 10 meq via ORAL
  Filled 2012-05-23: qty 1

## 2012-05-23 MED ORDER — ASPIRIN EC 81 MG PO TBEC
81.0000 mg | DELAYED_RELEASE_TABLET | Freq: Every day | ORAL | Status: DC
  Administered 2012-05-24: 81 mg via ORAL
  Filled 2012-05-23: qty 1

## 2012-05-23 MED ORDER — HEPARIN SODIUM (PORCINE) 1000 UNIT/ML IJ SOLN
INTRAMUSCULAR | Status: AC
  Filled 2012-05-23: qty 1

## 2012-05-23 MED ORDER — POTASSIUM CHLORIDE CRYS ER 20 MEQ PO TBCR
EXTENDED_RELEASE_TABLET | ORAL | Status: AC
  Filled 2012-05-23: qty 2

## 2012-05-23 MED ORDER — DIAZEPAM 5 MG PO TABS
5.0000 mg | ORAL_TABLET | ORAL | Status: AC
  Administered 2012-05-23: 5 mg via ORAL
  Filled 2012-05-23: qty 1

## 2012-05-23 MED ORDER — QUETIAPINE 12.5 MG HALF TABLET
12.5000 mg | ORAL_TABLET | Freq: Every day | ORAL | Status: DC
  Administered 2012-05-23: 21:00:00 12.5 mg via ORAL
  Filled 2012-05-23 (×3): qty 1

## 2012-05-23 MED ORDER — SODIUM CHLORIDE 0.9 % IV SOLN
1.0000 mL/kg/h | INTRAVENOUS | Status: DC
  Administered 2012-05-23: 1 mL/kg/h via INTRAVENOUS

## 2012-05-23 MED ORDER — ASPIRIN EC 81 MG PO TBEC
81.0000 mg | DELAYED_RELEASE_TABLET | Freq: Every day | ORAL | Status: DC
  Filled 2012-05-23: qty 1

## 2012-05-23 MED ORDER — FLUTICASONE PROPIONATE 50 MCG/ACT NA SUSP
1.0000 | Freq: Two times a day (BID) | NASAL | Status: DC
  Administered 2012-05-23 – 2012-05-24 (×2): 1 via NASAL
  Filled 2012-05-23: qty 16

## 2012-05-23 MED ORDER — ASPIRIN 81 MG PO CHEW
324.0000 mg | CHEWABLE_TABLET | ORAL | Status: AC
  Administered 2012-05-23: 324 mg via ORAL
  Filled 2012-05-23: qty 4

## 2012-05-23 MED ORDER — MECLIZINE HCL 12.5 MG PO TABS
12.5000 mg | ORAL_TABLET | Freq: Three times a day (TID) | ORAL | Status: DC | PRN
  Filled 2012-05-23: qty 2

## 2012-05-23 MED ORDER — POTASSIUM CHLORIDE CRYS ER 20 MEQ PO TBCR
20.0000 meq | EXTENDED_RELEASE_TABLET | Freq: Once | ORAL | Status: AC
  Administered 2012-05-23: 20 meq via ORAL

## 2012-05-23 MED ORDER — SODIUM CHLORIDE 0.9 % IJ SOLN
3.0000 mL | INTRAMUSCULAR | Status: DC | PRN
Start: 2012-05-23 — End: 2012-05-23

## 2012-05-23 MED ORDER — FENTANYL CITRATE 0.05 MG/ML IJ SOLN
INTRAMUSCULAR | Status: AC
Start: 2012-05-23 — End: 2012-05-23
  Filled 2012-05-23: qty 2

## 2012-05-23 MED ORDER — ACETAMINOPHEN 325 MG PO TABS: 650.0000 mg | ORAL_TABLET | ORAL | Status: DC | PRN

## 2012-05-23 MED ORDER — VITAMIN C 500 MG PO TABS
1000.0000 mg | ORAL_TABLET | Freq: Two times a day (BID) | ORAL | Status: DC
  Administered 2012-05-23 – 2012-05-24 (×2): 1000 mg via ORAL
  Filled 2012-05-23 (×4): qty 2

## 2012-05-23 MED ORDER — DIPHENHYDRAMINE HCL 50 MG/ML IJ SOLN
INTRAMUSCULAR | Status: AC
  Filled 2012-05-23: qty 1

## 2012-05-23 MED ORDER — DIAZEPAM 5 MG PO TABS
2.5000 mg | ORAL_TABLET | ORAL | Status: DC | PRN
  Administered 2012-05-23 – 2012-05-24 (×2): 2.5 mg via ORAL
  Filled 2012-05-23 (×2): qty 1

## 2012-05-23 MED ORDER — SODIUM CHLORIDE 0.9 % IV SOLN: INTRAVENOUS | Status: AC

## 2012-05-23 MED ORDER — VITAMIN B COMPLEX PO TABS: 1.0000 | ORAL_TABLET | Freq: Every day | ORAL | Status: DC

## 2012-05-23 NOTE — Progress Notes (Signed)
ANTICOAGULATION CONSULT NOTE - Initial Consult  Pharmacy Consult for Heparin Indication: chest pain/ACS  Allergies  Allergen Reactions  . Amoxicillin Anaphylaxis  . Penicillins Anaphylaxis  . Tolectin (Tolmetin Sodium) Anaphylaxis    Took at same time as amoxicillin when she had anaphylactic reaction.  . Ciprofloxacin Rash    Entire body looked as if sunburned, then skin peeled off.  . Prolactin   . Bactrim (Sulfamethoxazole W-Trimethoprim) Rash    ? Early rash on palms  . Codeine Nausea And Vomiting  . Contrast Media (Iodinated Diagnostic Agents) Hives    Patient had severe hives after Myelogram.  No issues breathing.  Returned for  a nerve root injection, was given Benadryl, mentioned some minor itching to daughter on the way home but no further issues. Was given a Benadryl today for this injection.   . Procardia (Nifedipine) Other (See Comments)    Causes aches down legs    Patient Measurements: Height: 5\' 4"  (162.6 cm) Weight: 171 lb 15.3 oz (78 kg) IBW/kg (Calculated) : 54.7 Heparin Dosing Weight: 71 kg  Vital Signs: BP: 144/72 mmHg (04/04 1330) Pulse Rate: 81 (04/04 1330)  Labs:  Recent Labs  05/23/12 1330  HGB 14.1  HCT 40.7  PLT 268  LABPROT 13.3  INR 1.02  CREATININE 0.63    Estimated Creatinine Clearance: 61.4 ml/min (by C-G formula based on Cr of 0.63).   Medical History: Past Medical History  Diagnosis Date  . Hypertension   . Diverticulosis   . Hemorrhoids, internal   . Anxiety   . Arthritis     osteoarthritis  . Vertigo   . 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 Endocarditis 10/18/2010    treated with vancomycin, gentamicin, and Cipro  . Hypokalemia   . Insomnia   . Renal insufficiency 11/29/2010    Medications:  Prescriptions prior to admission  Medication Sig Dispense Refill  . Amino Acids (AMINO ACID PO) Take by mouth daily.      . Ascorbic Acid  (VITAMIN C PO) Take 2,000 mg by mouth daily.      Marland Kitchen aspirin 81 MG tablet Take 81 mg by mouth daily.        . B Complex Vitamins (VITAMIN B COMPLEX PO) Take by mouth daily.      . Cholecalciferol (VITAMIN D PO) Take 2,000 mg by mouth daily.      . diazepam (VALIUM) 5 MG tablet Take 2.5 mg by mouth as needed.       . fluticasone (FLONASE) 50 MCG/ACT nasal spray Place 1 spray into the nose 2 (two) times daily.        . hydrochlorothiazide (HYDRODIURIL) 25 MG tablet Take 1 tablet (25 mg total) by mouth daily.  30 tablet  6  . meclizine (ANTIVERT) 25 MG tablet Take 12.5-25 mg by mouth every 8 (eight) hours as needed.      . potassium chloride (K-DUR,KLOR-CON) 10 MEQ tablet Take 1 tablet (10 mEq total) by mouth daily.  30 tablet  6  . PROAIR HFA 108 (90 BASE) MCG/ACT inhaler       . QUEtiapine (SEROQUEL) 25 MG tablet Take 12.5 mg by mouth at bedtime.        . sertraline (ZOLOFT) 50 MG tablet Take 100 mg by mouth daily.         Assessment: 77 yo F with hx  Bacterial endocarditis with subsequent septic emboli and  CVA.  She presents with abnormal CP, chest tightness, with radiation to her arms.  Plan to proceed with cardiac cath this afternoon.   Goal of Therapy:  Heparin level 0.3-0.7 units/ml Monitor platelets by anticoagulation protocol: Yes   Plan:  Heparin 3000 unit IV bolus x 1 Heparin infusion at 800 units/hr. Follow-up after cardiac cath.  [employer], Pharm.D., BCPS Clinical Pharmacist Pager [phone] 05/23/2012 3:13 PM

## 2012-05-23 NOTE — CV Procedure (Signed)
  Cardiac Catheterization Procedure Note  Name: TORA PRUNTY MRN: [id number] DOB: [birth year]  Procedure: Left Heart Cath, Selective Coronary Angiography, LV angiography  Indication:    Procedural details: The right radial was prepped, draped, and anesthetized with 1% lidocaine. Using modified Seldinger technique, a 5 French sheath was introduced into the right radial artery. Standard Judkins catheters were used for coronary angiography and left ventriculography. Catheter exchanges were performed over a guidewire. There were no immediate procedural complications. The patient was transferred to the post catheterization recovery area for further monitoring.  Of note we needed an AL 2 to visualize the RCA.  Procedural Findings:   Hemodynamics:     AO 172/86    LV 173/17   Coronary angiography:   Coronary dominance: Right  Left mainstem:   Normal  Left anterior descending (LAD):   Moderate calcification.  Long proximal 40% stenosis.  The vessel is large and wraps the apex.    Left circumflex (LCx):  AV groove large and normal.  RI large normal.  Very large branching MOM normal.  Right coronary artery (RCA):  Dominant.  High anterior take off.  Mild luminal irregularities.  (The vessel was not engaged.  I was only able to obtain flush injections.)  Aortic Root:  Normal  Left ventriculography:  NA (secondary to a large amount of contrast used to visualize the RCA I did not do an LV injection although I crossed for pressures.)  Final Conclusions:  Mild coronary plaque.  Normal LV  Recommendations:  Medical management.  Rollene Rotunda 05/23/2012, 5:54 PM

## 2012-05-23 NOTE — Interval H&P Note (Signed)
History and Physical Interval Note:  05/23/2012 4:47 PM  Frances Mendoza  has presented today for surgery, with the diagnosis of Chest pain  The various methods of treatment have been discussed with the patient and family. After consideration of risks, benefits and other options for treatment, the patient has consented to  Procedure(s): LEFT HEART CATHETERIZATION WITH CORONARY ANGIOGRAM (N/A) as a surgical intervention .  The patient's history has been reviewed, patient examined, no change in status, stable for surgery.  I have reviewed the patient's chart and labs.  Questions were answered to the patient's satisfaction.     Rollene Rotunda

## 2012-05-23 NOTE — Assessment & Plan Note (Signed)
That he has had an abnormal EKG for years. She's had positive cardiac enzymes in the past but at that time presented with a very large stroke do to septic emboli from her mitral valve. She did not have a cardiac catheterization because of the acute stroke.  He now presents with an episode of chest pain and tightness with radiation to her arms. Was associated with increasing shortness breath. It lasted for an hour so. She presents now with some mild ST segment depression in the lateral leads. She has old Q waves in the inferior leads.  I discussed the issues with the patient and the family. I think our best course of action is to admit her to the hospital and proceed with urgent cardiac catheterization this afternoon. I discussed the issues with the family at the patient and they agree to proceed.

## 2012-05-23 NOTE — Progress Notes (Signed)
TR BAND REMOVAL  LOCATION:  right radial  DEFLATED PER PROTOCOL:  yes  TIME BAND OFF / DRESSING APPLIED:   2200   SITE UPON ARRIVAL:   Level 0  SITE AFTER BAND REMOVAL:  Level 0  REVERSE ALLEN'S TEST:    positive  CIRCULATION SENSATION AND MOVEMENT:  Within Normal Limits  yes  COMMENTS:     

## 2012-05-23 NOTE — Patient Instructions (Addendum)
GO TO HOSPITAL 2900 UNIT FOR A BED YOU ARE SCHEDULED FOR A HEART CATH FOR THE AFTERNOON/ DO NOT EAT.

## 2012-05-23 NOTE — H&P (Signed)
Frances Mendoza Date of Birth              [birth year] [facility]                                                  [place] Office   [address]    [address]                         [address] [place], [place]  [address]                                           [place], [place]  [address] [phone]              Fax  [phone]                 [phone]  Fax [phone]     Problem list 1. Endocarditis 2.  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  She was hospitalized in November for CP.  She ruled out for MI.   She complains of lots of pain in the back of both hips radiating down to her legs. She thinks may be related to one of her medications. He typically hurts worse when she stands up and walks around. It does not hurt at night when she is lying in bed.  The pain occurs whenever she stands or walks - not when she is lying down.   She denies any cardiac issues.   May 23, 2012   Frances Mendoza awoke with chest pain and left pain.  The pain lasted for 1-2 hours.  Her son checked her BP and HR which were normal.    The severe pain has resolved but her chest is still not quite normal.     She has had some pains in her chest in the past but this was more severe.  The pain resolved with a valium.  The pains were no pleuretic.  She had dyspnea associated with the pain.  No diaphoresis.  No syncope.  No palpitations.   No fever/ chills/ blood in urine or stool.    She gets some exercise - she has some dyspnea but no angina.     She has occasional episodes of palptations.  She feels like her heart is racing for 5 - 10 minutes.     She takes the valium 2-3 times a day.     She has chronic back pain and has been told that she needs to have surgery.  She does not want to have surgery.      Current Outpatient Prescriptions on File  Prior to Visit   Medication  Sig  Dispense  Refill   .  Amino Acids (AMINO ACID PO)  Take by mouth daily.         .  Ascorbic Acid (VITAMIN C PO)  Take 2,000 mg by mouth daily.         Marland Kitchen  aspirin 81 MG tablet  Take 81 mg by mouth daily.           Marland Kitchen  B Complex Vitamins (VITAMIN B COMPLEX PO)  Take by mouth daily.         .  Cholecalciferol (VITAMIN D PO)  Take 2,000 mg by mouth daily.         .  diazepam (VALIUM) 5 MG tablet  Take 2.5 mg by mouth as needed.          .  fluticasone (FLONASE) 50 MCG/ACT nasal spray  Place 1 spray into the nose 2 (two) times daily.           .  hydrochlorothiazide (HYDRODIURIL) 25 MG tablet  Take 1 tablet (25 mg total) by mouth daily.   30 tablet   6   .  meclizine (ANTIVERT) 25 MG tablet  Take 12.5-25 mg by mouth every 8 (eight) hours as needed.         .  potassium chloride (K-DUR,KLOR-CON) 10 MEQ tablet  Take 1 tablet (10 mEq total) by mouth daily.   30 tablet   6   .  PROAIR HFA 108 (90 BASE) MCG/ACT inhaler           .  QUEtiapine (SEROQUEL) 25 MG tablet  Take 12.5 mg by mouth at bedtime.           .  sertraline (ZOLOFT) 50 MG tablet  Take 100 mg by mouth daily.              No current facility-administered medications on file prior to visit.         Allergies   Allergen  Reactions   .  Amoxicillin  Anaphylaxis   .  Penicillins  Anaphylaxis   .  Tolectin (Tolmetin Sodium)  Anaphylaxis       Took at same time as amoxicillin when she had anaphylactic reaction.   .  Ciprofloxacin  Rash       Entire body looked as if sunburned, then skin peeled off.   .  Prolactin     .  Bactrim (Sulfamethoxazole W-Trimethoprim)  Rash       ? Early rash on palms   .  Codeine  Nausea And Vomiting   .  Contrast Media (Iodinated Diagnostic Agents)  Hives       Patient had severe hives after Myelogram.  No issues breathing.  Returned for  a nerve root injection, was given Benadryl, mentioned some minor itching to daughter on the way home but no further issues. Was given a  Benadryl today for this injection.    .  Procardia (Nifedipine)  Other (See Comments)       Causes aches down legs         Past Medical History   Diagnosis  Date   .  Hypertension     .  Diverticulosis     .  Hemorrhoids, internal     .  Anxiety     .  Arthritis         osteoarthritis   .  Vertigo     .  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  Septic embolism  10/18/2010       endocarditis -- per TEE   .  Endocarditis  10/18/2010       treated with vancomycin, gentamicin, and Cipro   .  Hypokalemia     .  Insomnia     .  Renal insufficiency  11/29/2010         Past Surgical  History   Procedure  Laterality  Date   .  Cholecystectomy    09/29/1999   .  Abdominal hysterectomy       .  Bladder tuck       .  Tee without cardioversion    10/10/2010        done with suggestion of small mitral vegetation.  ID was consulted for input and ID feels  patient with possible endocarditis with septic emboli         History   Smoking status   .  Never Smoker    Smokeless tobacco   .  Never Used         History   Alcohol Use  No         Family History   Problem  Relation  Age of Onset   .  Coronary artery disease           family history of   .  Colon cancer           family history of        Reviw of Systems:   Reviewed in the HPI.  All other systems are negative.   Physical Exam: Blood pressure 146/90, pulse 81, height 5\' 4"  (1.626 m), weight 174 lb 1.9 oz (78.98 kg), SpO2 92.00%. General: Well developed, well nourished, in no acute distress.   Head: Normocephalic, atraumatic, sclera non-icteric, mucus membranes are moist,    Neck: Supple. Negative for carotid bruits. JVD not elevated.   Lungs: Clear bilaterally to auscultation without wheezes, rales, or rhonchi. Breathing is unlabored.   Heart: RRR with S1 S2. She has a 1-2/6 systolic murmur   Abdomen: Soft, non-tender, non-distended with normoactive bowel sounds. No  hepatomegaly. No rebound/guarding. No obvious abdominal masses.   Msk:  Strength and tone appear normal for age.   Extremities: No clubbing or cyanosis. No edema.  Distal pedal pulses are 2+ and equal bilaterally.  Her leg pulses are especially good. She has some arthritis in her hands.   Neuro: Alert and oriented X 3. Moves all extremities spontaneously.   Psych:  Responds to questions appropriately with a normal affect.   ECG:   May 23, 2012:  NSR at 69, LVH with repol.  Inf. MI,  ST depression - worsened since previous tracing   Assessment / Plan:               Revision History       Date/Time User Action    > 05/23/2012 12:19 PM Vesta Mixer, MD Addendum      05/23/2012 12:06 PM Vesta Mixer, MD Signed              Coronary artery disease - Vesta Mixer, MD at 05/23/2012 12:01 PM    Status: Written Related Problem: Coronary artery disease           That he has had an abnormal EKG for years. She's had positive cardiac enzymes in the past but at that time presented with a very large stroke do to septic emboli from her mitral valve. She did not have a cardiac catheterization because of the acute stroke.   He now presents with an episode of chest pain and tightness with radiation to her arms. Was associated with increasing shortness breath. It lasted for an hour so. She presents now with some mild ST segment depression in the lateral leads. She has old Q waves in the inferior leads.  I discussed the issues with the patient and the family. I think our best course of action is to admit her to the hospital and proceed with urgent cardiac catheterization this afternoon. I discussed the issues with the family at the patient and they agree to proceed.    Vesta Mixer, Montez Hageman., MD, [facility] 05/23/2012, 12:49 PM Office - [phone] Pager [phone]

## 2012-05-23 NOTE — Progress Notes (Addendum)
Frances Mendoza Date of Birth  02-24-1935 [facility]     [place] Office  [address]    [address]   [address] [place], [place]  [address]    [place], [place]  [address] [phone]  Fax  [phone]  [phone]  Fax [phone]   Problem list 1. Endocarditis 2.  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  She was hospitalized in November for CP.  She ruled out for MI.  She complains of lots of pain in the back of both hips radiating down to her legs. She thinks may be related to one of her medications. He typically hurts worse when she stands up and walks around. It does not hurt at night when she is lying in bed.  The pain occurs whenever she stands or walks - not when she is lying down.  She denies any cardiac issues.  May 23, 2012  Frances Mendoza awoke with chest pain and left pain.  The pain lasted for 1-2 hours.  Her son checked her BP and HR which were normal.    The severe pain has resolved but her chest is still not quite normal.    She has had some pains in her chest in the past but this was more severe.  The pain resolved with a valium.  The pains were no pleuretic.  She had dyspnea associated with the pain.  No diaphoresis.  No syncope.  No palpitations.   No fever/ chills/ blood in urine or stool.   She gets some exercise - she has some dyspnea but no angina.    She has occasional episodes of palptations.  She feels like her heart is racing for 5 - 10 minutes.    She takes the valium 2-3 times a day.    She has chronic back pain and has been told that she needs to have surgery.  She does not want to have surgery.    Current Outpatient Prescriptions on File Prior to Visit  Medication Sig Dispense Refill  . Amino Acids (AMINO ACID PO) Take by mouth daily.      . Ascorbic Acid (VITAMIN C PO) Take 2,000 mg by mouth daily.       Marland Kitchen aspirin 81 MG tablet Take 81 mg by mouth daily.        . B Complex Vitamins (VITAMIN B COMPLEX PO) Take by mouth daily.      . Cholecalciferol (VITAMIN D PO) Take 2,000 mg by mouth daily.      . diazepam (VALIUM) 5 MG tablet Take 2.5 mg by mouth as needed.       . fluticasone (FLONASE) 50 MCG/ACT nasal spray Place 1 spray into the nose 2 (two) times daily.        . hydrochlorothiazide (HYDRODIURIL) 25 MG tablet Take 1 tablet (25 mg total) by mouth daily.  30 tablet  6  . meclizine (ANTIVERT) 25 MG tablet Take 12.5-25 mg by mouth every 8 (eight) hours as needed.      . potassium chloride (K-DUR,KLOR-CON) 10 MEQ tablet Take 1 tablet (10 mEq total) by mouth daily.  30 tablet  6  . PROAIR HFA 108 (90 BASE) MCG/ACT inhaler       . QUEtiapine (SEROQUEL) 25 MG tablet Take 12.5 mg by mouth at bedtime.        Marland Kitchen  sertraline (ZOLOFT) 50 MG tablet Take 100 mg by mouth daily.        No current facility-administered medications on file prior to visit.    Allergies  Allergen Reactions  . Amoxicillin Anaphylaxis  . Penicillins Anaphylaxis  . Tolectin (Tolmetin Sodium) Anaphylaxis    Took at same time as amoxicillin when she had anaphylactic reaction.  . Ciprofloxacin Rash    Entire body looked as if sunburned, then skin peeled off.  . Prolactin   . Bactrim (Sulfamethoxazole W-Trimethoprim) Rash    ? Early rash on palms  . Codeine Nausea And Vomiting  . Contrast Media (Iodinated Diagnostic Agents) Hives    Patient had severe hives after Myelogram.  No issues breathing.  Returned for  a nerve root injection, was given Benadryl, mentioned some minor itching to daughter on the way home but no further issues. Was given a Benadryl today for this injection.   . Procardia (Nifedipine) Other (See Comments)    Causes aches down legs    Past Medical History  Diagnosis Date  . Hypertension   . Diverticulosis   . Hemorrhoids, internal   . Anxiety   . Arthritis     osteoarthritis  . Vertigo   .  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 Endocarditis 10/18/2010    treated with vancomycin, gentamicin, and Cipro  . Hypokalemia   . Insomnia   . Renal insufficiency 11/29/2010    Past Surgical History  Procedure Laterality Date  . Cholecystectomy  09/29/1999  . Abdominal hysterectomy    . Bladder tuck    . Tee without cardioversion  10/10/2010     done with suggestion of small mitral vegetation.  ID was consulted for input and ID feels  patient with possible endocarditis with septic emboli    History  Smoking status  . Never Smoker   Smokeless tobacco  . Never Used    History  Alcohol Use No    Family History  Problem Relation Age of Onset  . Coronary artery disease      family history of  . Colon cancer      family history of    Reviw of Systems:  Reviewed in the HPI.  All other systems are negative.  Physical Exam: Blood pressure 146/90, pulse 81, height 5\' 4"  (1.626 m), weight 174 lb 1.9 oz (78.98 kg), SpO2 92.00%. General: Well developed, well nourished, in no acute distress.  Head: Normocephalic, atraumatic, sclera non-icteric, mucus membranes are moist,   Neck: Supple. Negative for carotid bruits. JVD not elevated.  Lungs: Clear bilaterally to auscultation without wheezes, rales, or rhonchi. Breathing is unlabored.  Heart: RRR with S1 S2. She has a 1-2/6 systolic murmur  Abdomen: Soft, non-tender, non-distended with normoactive bowel sounds. No hepatomegaly. No rebound/guarding. No obvious abdominal masses.  Msk:  Strength and tone appear normal for age.  Extremities: No clubbing or cyanosis. No edema.  Distal pedal pulses are 2+ and equal bilaterally.  Her leg pulses are especially good. She has some arthritis in her hands.  Neuro: Alert and oriented X 3. Moves all extremities spontaneously.  Psych:  Responds to questions appropriately with a normal  affect.  ECG:  May 23, 2012:  NSR at 74, LVH with repol.  Inf. MI,  ST depression - worsened since previous tracing  Assessment / Plan:

## 2012-05-23 NOTE — Care Management Note (Signed)
    Page 1 of 1   05/23/2012     3:13:52 PM   CARE MANAGEMENT NOTE 05/23/2012  Patient:  Frances Mendoza, Frances Mendoza   Account Number:  [id number]  Date Initiated:  05/23/2012  Documentation initiated by:  Junius Creamer  Subjective/Objective Assessment:   adm w mi     Action/Plan:   lives w husband, pcp dr Fayrene Fearing davis   Anticipated DC Date:     Anticipated DC Plan:        DC Planning Services  CM consult      Choice offered to / List presented to:             Status of service:   Medicare Important Message given?   (If response is "NO", the following Medicare IM given date fields will be blank) Date Medicare IM given:   Date Additional Medicare IM given:    Discharge Disposition:    Per UR Regulation:  Reviewed for med. necessity/level of care/duration of stay  If discussed at Long Length of Stay Meetings, dates discussed:    Comments:  4/4 1513 debbie Shiryl Ruddy rn,bsn

## 2012-05-24 ENCOUNTER — Encounter (HOSPITAL_COMMUNITY): Payer: Self-pay | Admitting: Physician Assistant

## 2012-05-24 LAB — COMPREHENSIVE METABOLIC PANEL
AST: 16 U/L (ref 0–37)
BUN: 10 mg/dL (ref 6–23)
CO2: 25 mEq/L (ref 19–32)
Chloride: 102 mEq/L (ref 96–112)
Creatinine, Ser: 0.62 mg/dL (ref 0.50–1.10)
GFR calc non Af Amer: 86 mL/min — ABNORMAL LOW (ref >90)
Total Bilirubin: 0.3 mg/dL (ref 0.3–1.2)

## 2012-05-24 LAB — LIPID PANEL
LDL Cholesterol: 178 mg/dL — ABNORMAL HIGH (ref 0–99)
Triglycerides: 122 mg/dL (ref <150)

## 2012-05-24 MED ORDER — NITROGLYCERIN 0.4 MG SL SUBL: 0.4000 mg | SUBLINGUAL_TABLET | SUBLINGUAL | Status: AC | PRN

## 2012-05-24 MED ORDER — POTASSIUM CHLORIDE CRYS ER 10 MEQ PO TBCR: 20.0000 meq | EXTENDED_RELEASE_TABLET | Freq: Every day | ORAL | Status: DC

## 2012-05-24 NOTE — Discharge Summary (Signed)
Discharge Summary   Patient ID: Frances Mendoza, MRN: [id number], DOB/AGE: Sep 10, 1935 77 y.o.  Admit date: 05/23/2012 Discharge date: 05/24/2012   Primary Care Physician:  Mendoza,Frances Budden W   Primary Cardiologist:  Dr. Delane Mendoza   Reason for Admission:  Unstable Angina  Primary Discharge Diagnoses:  1. CAD: Non-obstructive by cardiac catheterization this admission-medical therapy 2. Hyperlipidemia 3. Hypertension 4. Prior stroke in the setting of endocarditis with septic emboli in 09/2010  Secondary Discharge Diagnoses:   Past Medical History  Diagnosis Date  . Hypertension   . Diverticulosis   . Hemorrhoids, internal   . Anxiety   . Arthritis     osteoarthritis  . Vertigo   . 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 Endocarditis 10/18/2010    treated with vancomycin, gentamicin, and Cipro  . Hypokalemia   . Insomnia   . Renal insufficiency 11/29/2010  . CAD (coronary artery disease)     a. admx with CP c/w [place] 4/14 => LHC: pLAD 40%, RCA with lum irregs => med Rx  . HLD (hyperlipidemia)      Allergies:    Allergies  Allergen Reactions  . Amoxicillin Anaphylaxis  . Penicillins Anaphylaxis  . Tolectin (Tolmetin Sodium) Anaphylaxis    Took at same time as amoxicillin when she had anaphylactic reaction.  . Ciprofloxacin Rash    Entire body looked as if sunburned, then skin peeled off.  . Prolactin   . Bactrim (Sulfamethoxazole W-Trimethoprim) Rash    ? Early rash on palms  . Codeine Nausea And Vomiting  . Contrast Media (Iodinated Diagnostic Agents) Hives    Patient had severe hives after Myelogram.  No issues breathing.  Returned for  a nerve root injection, was given Benadryl, mentioned some minor itching to daughter on the way home but no further issues. Was given a Benadryl today for this injection.   . Procardia (Nifedipine) Other (See Comments)    Causes aches down legs     Procedures Performed This Admission:    LHC 05/23/12:   Left mainstem: Normal  Left anterior descending (LAD): Moderate calcification. Long proximal 40% stenosis. The vessel is large and wraps the apex.  Left circumflex (LCx): AV groove large and normal. RI large normal. Very large branching MOM normal.  Right coronary artery (RCA): Dominant. High anterior take off. Mild luminal irregularities. (The vessel was not engaged. I was only able to obtain flush injections.)  Aortic Root: Normal  Left ventriculography: NA (secondary to a large amount of contrast used to visualize the RCA I did not do an LV injection although I crossed for pressures.)  Final Conclusions: Mild coronary plaque. Normal LV  Recommendations: Medical management.   Hospital Course:  Frances Mendoza is a 77 y.o. female with a history of prior stroke in the setting of bacterial endocarditis and subsequent septic emboli in 09/2010, HTN, anxiety. She was seen in the office complaints of chest pain. ECG was felt to be abnormal in comparison to prior tracings.  She was admitted for further evaluation to include cardiac catheterization. LHC the same day demonstrated just minimal coronary plaque.  No LV gram was done due to high contrast load.  EF has been normal in the past with prior echo.  She tolerated the procedure well without immediate complications.  She denies any further CP this am.  She has been seen by Frances Mendoza and felt stable for d/c  to home.  Of note, her LDL is high and with CAD, she would benefit from statin therapy.  She has a lot of muscle cramps and prefers to hold off on statin Rx until these resolve.  She can discuss statin Rx with her PCP or Frances Mendoza in f/u.  K+ has been low and her dose of KCL will be increased to 20 mEq QD.  A f/u BMET will be obtained next week.  She may benefit from d/c of HCTZ but should d/w her PCP.   Discharge Vitals: Blood pressure 119/59, pulse 94, temperature 98.7 F (37.1 C),  temperature source Oral, resp. rate 18, height 5\' 4"  (1.626 m), weight 173 lb 1 oz (78.5 kg), SpO2 96.00%.  Labs:  Recent Labs  05/23/12 1330  WBC 10.5  HGB 14.1  HCT 40.7  MCV 89.3  PLT 268    Recent Labs  05/23/12 1330 05/24/12 0600  NA 144 138  K 3.4* 3.4*  CL 103 102  CO2 28 25  BUN 8 10  CREATININE 0.63 0.62  CALCIUM 10.2 9.3  PROT  --  6.9  BILITOT  --  0.3  ALKPHOS  --  69  ALT  --  13  AST  --  16    Lab Results  Component Value Date   CHOL 259* 05/24/2012   HDL 57 05/24/2012   LDLCALC 098* 05/24/2012   TRIG 122 05/24/2012     Recent Labs  05/23/12 1330  INR 1.02    Diagnostic Procedures and Studies:  Portable Chest X-ray 1 View  05/23/2012     IMPRESSION: No acute cardiopulmonary process.   Original Report Authenticated By: Frances Mendoza, M.D.     Disposition:   Pt is being discharged home today in good condition.  Follow-up Plans & Appointments  Follow-up Information   Follow up with Frances Mendoza., MD In 2 weeks. (office will call to arrange follow up)    Contact information:   [address] [place] [address] [phone]       Follow up with Frances Richmond, MD. Call in 2 weeks.   Contact information:   [address] [place] [address] [phone]       Follow up with Frances Mendoza [address] In 1 week. (office will call to schedule a lab visit (Test is called a BMET - to check your potassium))    Contact information:   [address] [place] [address]       Discharge Medications    Medication List    ASK your doctor about these medications       albuterol 108 (90 BASE) MCG/ACT inhaler  Commonly known as:  PROVENTIL HFA;VENTOLIN HFA  Inhale 2 puffs into the lungs every 6 (six) hours as needed for wheezing or shortness of breath.     AMINO ACID PO  Take 1 capsule by mouth daily.     aspirin 81 MG tablet  Take 81 mg by mouth daily.     b complex vitamins tablet  Take 1 tablet by mouth  daily.     diazepam 5 MG tablet  Commonly known as:  VALIUM  Take 5 mg by mouth at bedtime.     fluticasone 50 MCG/ACT nasal spray  Commonly known as:  FLONASE  Place 1 spray into the nose 2 (two) times daily.     hydrochlorothiazide 25 MG tablet  Commonly known as:  HYDRODIURIL  Take 1 tablet (  25 mg total) by mouth daily.     potassium chloride 10 MEQ tablet  Commonly known as:  K-DUR,KLOR-CON  Take 1 tablet (10 mEq total) by mouth daily.     QUEtiapine 25 MG tablet  Commonly known as:  SEROQUEL  Take 12.5 mg by mouth daily.     sertraline 100 MG tablet  Commonly known as:  ZOLOFT  Take 100 mg by mouth daily.     VITAMIN C PO  Take 2,000 mg by mouth daily.     VITAMIN D PO  Take 2,000 mg by mouth daily.        Outstanding Labs/Studies  1. BMET in 1 week  (patient lives an hour away from [facility] - Rx given to have labs drawn at [facility] office and faxed)  Duration of Discharge Encounter: Greater than 30 minutes including physician and PA time.  Luna Glasgow, PA-C  11:23 AM 05/24/2012    Frances Range, MD

## 2012-05-24 NOTE — Progress Notes (Addendum)
SUBJECTIVE: The patient is doing well today.  At this time, she denies chest pain, shortness of breath, or any new concerns.  She has leg cramps which are a chronic problem.  Marland Kitchen aspirin EC  81 mg Oral Daily  . B-complex with vitamin C  1 tablet Oral Daily  . cholecalciferol  400 Units Oral Daily  . fluticasone  1 spray Each Nare BID  . hydrochlorothiazide  25 mg Oral Daily  . potassium chloride  10 mEq Oral Daily  . QUEtiapine  12.5 mg Oral QHS  . sertraline  100 mg Oral Daily  . vitamin C  1,000 mg Oral BID      OBJECTIVE: Physical Exam: Filed Vitals:   05/24/12 0056 05/24/12 0057 05/24/12 0352 05/24/12 0722  BP:   143/73 119/59  Pulse: 91 90 96 94  Temp:   97.8 F (36.6 C) 98.7 F (37.1 C)  TempSrc:   Oral Oral  Resp:   18 18  Height:      Weight:   173 lb 1 oz (78.5 kg)   SpO2: 88% 94% 96% 96%    Intake/Output Summary (Last 24 hours) at 05/24/12 0802 Last data filed at 05/24/12 0800  Gross per 24 hour  Intake 1250.85 ml  Output   1100 ml  Net 150.85 ml    Telemetry reveals sinus rhythm  GEN- The patient is well appearing, alert and oriented x 3 today.   Head- normocephalic, atraumatic Eyes-  Sclera clear, conjunctiva pink Ears- hearing intact Oropharynx- clear Neck- supple, no JVP Lymph- no cervical lymphadenopathy Lungs- Clear to ausculation bilaterally, normal work of breathing Heart- Regular rate and rhythm, no murmurs, rubs or gallops, PMI not laterally displaced GI- soft, NT, ND, + BS Extremities- no clubbing, cyanosis, or edema  LABS: Basic Metabolic Panel:  Recent Labs  16/10/96 1330 05/24/12 0600  NA 144 138  K 3.4* 3.4*  CL 103 102  CO2 28 25  GLUCOSE 95 132*  BUN 8 10  CREATININE 0.63 0.62  CALCIUM 10.2 9.3   Liver Function Tests:  Recent Labs  05/24/12 0600  AST 16  ALT 13  ALKPHOS 69  BILITOT 0.3  PROT 6.9  ALBUMIN 3.9   No results found for this basename: LIPASE, AMYLASE,  in the last 72 hours CBC:  Recent Labs  05/23/12 1330  WBC 10.5  HGB 14.1  HCT 40.7  MCV 89.3  PLT 268   Cardiac Enzymes: No results found for this basename: CKTOTAL, CKMB, CKMBINDEX, TROPONINI,  in the last 72 hours BNP: No components found with this basename: POCBNP,  D-Dimer: No results found for this basename: DDIMER,  in the last 72 hours Hemoglobin A1C: No results found for this basename: HGBA1C,  in the last 72 hours Fasting Lipid Panel:  Recent Labs  05/24/12 0500  CHOL 259*  HDL 57  LDLCALC 178*  TRIG 122  CHOLHDL 4.5   Thyroid Function Tests: No results found for this basename: TSH, T4TOTAL, FREET3, T3FREE, THYROIDAB,  in the last 72 hours Anemia Panel: No results found for this basename: VITAMINB12, FOLATE, FERRITIN, TIBC, IRON, RETICCTPCT,  in the last 72 hours  RADIOLOGY: Portable Chest X-ray 1 View  05/23/2012  *RADIOLOGY REPORT*  Clinical Data: Chest pain  PORTABLE CHEST - 1 VIEW  Comparison: None.  Findings: Normal mediastinum and cardiac silhouette.  Normal pulmonary  vasculature.  No evidence of effusion, infiltrate, or pneumothorax.  No acute bony abnormality.  IMPRESSION: No acute cardiopulmonary process.  Original Report Authenticated By: Frances Mendoza, M.D.     ASSESSMENT AND PLAN:   1. Chest pain- resolved 2. CAD- nonobstructive Medical management advised 3. HL Elevated lipids Given leg cramps, she is reluctant to start a statin today.  This should be considered once her leg cramps are improved.  Follow-up with PCP for leg cramps 4. Hypokalemia Probably due to hctz and hypokalemia Replete K Consider stopping hctz as an outpatient  DC to home Follow-up with Dr Gerlene Fee, MD 05/24/2012 8:02 AM

## 2012-05-27 NOTE — Addendum Note (Signed)
Addended by: Debbe Bales on: 05/27/2012 04:36 PM   Modules accepted: Orders

## 2012-05-28 ENCOUNTER — Ambulatory Visit (INDEPENDENT_AMBULATORY_CARE_PROVIDER_SITE_OTHER): Payer: Medicare Other | Admitting: *Deleted

## 2012-05-28 DIAGNOSIS — I2510 Atherosclerotic heart disease of native coronary artery without angina pectoris: Secondary | ICD-10-CM

## 2012-05-28 DIAGNOSIS — R6510 Systemic inflammatory response syndrome (SIRS) of non-infectious origin without acute organ dysfunction: Secondary | ICD-10-CM

## 2012-05-28 LAB — BASIC METABOLIC PANEL
GFR: 86.67 mL/min (ref >60.00)
Potassium: 3.6 mEq/L (ref 3.5–5.1)
Sodium: 140 mEq/L (ref 135–145)

## 2012-05-29 ENCOUNTER — Ambulatory Visit
Admission: RE | Admit: 2012-05-29 | Discharge: 2012-05-29 | Disposition: A | Discharge status: Home / Self Care | Payer: Medicare Other | Source: Ambulatory Visit | Attending: Neurosurgery | Admitting: Neurosurgery

## 2012-05-29 VITALS — BP 166/88 | HR 84

## 2012-05-29 DIAGNOSIS — M5410 Radiculopathy, site unspecified: Secondary | ICD-10-CM

## 2012-05-29 DIAGNOSIS — M549 Dorsalgia, unspecified: Secondary | ICD-10-CM

## 2012-05-29 MED ORDER — IOHEXOL 180 MG/ML  SOLN
1.0000 mL | Freq: Once | INTRAMUSCULAR | Status: AC | PRN
  Administered 2012-05-29: 1 mL via EPIDURAL

## 2012-05-29 MED ORDER — DIPHENHYDRAMINE HCL 50 MG PO CAPS
50.0000 mg | ORAL_CAPSULE | Freq: Once | ORAL | Status: AC
  Administered 2012-05-29: 50 mg via ORAL

## 2012-05-29 MED ORDER — METHYLPREDNISOLONE ACETATE 40 MG/ML INJ SUSP (RADIOLOG
120.0000 mg | Freq: Once | INTRAMUSCULAR | Status: AC
  Administered 2012-05-29: 120 mg via EPIDURAL

## 2012-06-06 ENCOUNTER — Encounter: Payer: Self-pay | Admitting: Nurse Practitioner

## 2012-06-06 ENCOUNTER — Ambulatory Visit (INDEPENDENT_AMBULATORY_CARE_PROVIDER_SITE_OTHER): Payer: Medicare Other | Admitting: Nurse Practitioner

## 2012-06-06 VITALS — BP 134/68 | HR 88 | Ht 64.0 in | Wt 178.0 lb

## 2012-06-06 DIAGNOSIS — I2510 Atherosclerotic heart disease of native coronary artery without angina pectoris: Secondary | ICD-10-CM

## 2012-06-06 NOTE — Progress Notes (Signed)
[name] July Date of Birth: [birth year] Medical Record #[id number]  History of Present Illness: Frances Mendoza is seen back today for a post hospital visit. She is seen for Dr. Elease Hashimoto. 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   Most recently admitted with chest pain. Cath was performed. She has a 40% LAD lesion - managed medically.   She comes in today. She is here with her daughter and husband. Doing ok since discharge. No chest pain. No problems with her arm. Has more issues related to her back and has had recent injection with some improvement. Potassium was low and was replaced. Recheck was 3.6. She continues to refuse statin therapy. BP has been ok. Does have some heart racing at times. She drinks at least 12 cups of coffee per day.   Current Outpatient Prescriptions on File Prior to Visit  Medication Sig Dispense Refill  . albuterol (PROVENTIL HFA;VENTOLIN HFA) 108 (90 BASE) MCG/ACT inhaler Inhale 2 puffs into the lungs every 6 (six) hours as needed for wheezing or shortness of breath.      . Amino Acids (AMINO ACID PO) Take 1 capsule by mouth daily.       . Ascorbic Acid (VITAMIN C PO) Take 2,000 mg by mouth daily.      Marland Kitchen aspirin 81 MG tablet Take 81 mg by mouth daily.        Marland Kitchen b complex vitamins tablet Take 1 tablet by mouth daily.      . Cholecalciferol (VITAMIN D PO) Take 2,000 mg by mouth daily.      . diazepam (VALIUM) 5 MG tablet Take 5 mg by mouth every 8 (eight) hours as needed.       . fluticasone (FLONASE) 50 MCG/ACT nasal spray Place 1 spray into the nose 2 (two) times daily.        . hydrochlorothiazide (HYDRODIURIL) 25 MG tablet Take 1 tablet (25 mg total) by mouth daily.  30 tablet  6  . nitroGLYCERIN (NITROSTAT) 0.4 MG SL tablet Place 1 tablet (0.4 mg total) under the tongue every 5 (five) minutes x 3 doses as needed for chest pain.  25 tablet  11  . potassium chloride  (K-DUR,KLOR-CON) 10 MEQ tablet Take 2 tablets (20 mEq total) by mouth daily.  30 tablet  6  . QUEtiapine (SEROQUEL) 25 MG tablet Take 12.5 mg by mouth daily.      . sertraline (ZOLOFT) 100 MG tablet Take 100 mg by mouth daily.       No current facility-administered medications on file prior to visit.    Allergies  Allergen Reactions  . Amoxicillin Anaphylaxis  . Penicillins Anaphylaxis  . Tolectin (Tolmetin Sodium) Anaphylaxis    Took at same time as amoxicillin when she had anaphylactic reaction.  . Ciprofloxacin Rash    Entire body looked as if sunburned, then skin peeled off.  . Prolactin   . Bactrim (Sulfamethoxazole W-Trimethoprim) Rash    ? Early rash on palms  . Codeine Nausea And Vomiting  . Contrast Media (Iodinated Diagnostic Agents) Hives    Patient had severe hives after Myelogram.  No issues breathing.  Returned for  a nerve root injection, was given Benadryl, mentioned some minor itching to daughter on the way home but no further issues. Was given a Benadryl today for this injection.   . Procardia (Nifedipine) Other (See Comments)  Causes aches down legs    Past Medical History  Diagnosis Date  . Hypertension   . Diverticulosis   . Hemorrhoids, internal   . Anxiety   . Arthritis     osteoarthritis  . Vertigo   . 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 Endocarditis 10/18/2010    treated with vancomycin, gentamicin, and Cipro  . Hypokalemia   . Insomnia   . Renal insufficiency 11/29/2010  . CAD (coronary artery disease)     a. admx with CP c/w [place] 4/14 => LHC: pLAD 40%, RCA with lum irregs => med Rx  . HLD (hyperlipidemia)     Past Surgical History  Procedure Laterality Date  . Cholecystectomy  09/29/1999  . Abdominal hysterectomy    . Bladder tuck    . Tee without cardioversion  10/10/2010     done with suggestion of small mitral vegetation.  ID was consulted for input and  ID feels  patient with possible endocarditis with septic emboli    History  Smoking status  . Never Smoker   Smokeless tobacco  . Never Used    History  Alcohol Use No    Family History  Problem Relation Age of Onset  . Coronary artery disease      family history of  . Colon cancer      family history of    Review of Systems: The review of systems is per the HPI.  All other systems were reviewed and are negative.  Physical Exam: BP 134/68  Pulse 88  Ht 5\' 4"  (1.626 m)  Wt 178 lb (80.74 kg)  BMI 30.54 kg/m2 Patient is very pleasant and in no acute distress. She is obese. Skin is warm and dry. Color is normal.  HEENT is unremarkable. Normocephalic/atraumatic. PERRL. Sclera are nonicteric. Neck is supple. No masses. No JVD. Lungs are clear. Cardiac exam shows a regular rate and rhythm. Soft outflow murmur noted.  Abdomen is soft. Extremities are without edema. Gait and ROM are intact. No gross neurologic deficits noted.  LABORATORY DATA:  Lab Results  Component Value Date   WBC 10.5 05/23/2012   HGB 14.1 05/23/2012   HCT 40.7 05/23/2012   PLT 268 05/23/2012   GLUCOSE 101* 05/28/2012   CHOL 259* 05/24/2012   TRIG 122 05/24/2012   HDL 57 05/24/2012   LDLCALC 409* 05/24/2012   ALT 13 05/24/2012   AST 16 05/24/2012   NA 140 05/28/2012   K 3.6 05/28/2012   CL 102 05/28/2012   CREATININE 0.7 05/28/2012   BUN 11 05/28/2012   CO2 28 05/28/2012   TSH 1.952 10/07/2010   INR 1.02 05/23/2012   HGBA1C 5.6 10/19/2010   Coronary angiography:   Left mainstem: Normal  Left anterior descending (LAD): Moderate calcification. Long proximal 40% stenosis. The vessel is large and wraps the apex.  Left circumflex (LCx): AV groove large and normal. RI large normal. Very large branching MOM normal.  Right coronary artery (RCA): Dominant. High anterior take off. Mild luminal irregularities. (The vessel was not engaged. I was only able to obtain flush injections.)  Aortic Root: Normal   Left ventriculography: NA  (secondary to a large amount of contrast used to visualize the RCA I did not do an LV injection although I crossed for pressures.)   Final Conclusions: Mild coronary plaque. Normal LV  Recommendations: Medical management.  Rollene Rotunda  05/23/2012, 5:54 PM  Assessment / Plan: 1. S/P recent cath - with nonobstructive CAD - managed medically.   2. HLD - refuses statin therapy  3. HTN - BP looks ok  4. Palpitations - most likely related to her excessive caffeine intake.   5. Hypokalemia - recheck was 3.6.   Will see her back in 4 months. No change in her current regimen. Encouraged more activity.   Patient is agreeable to this plan and will call if any problems develop in the interim.   Rosalio Macadamia, RN, ANP-C [facility] [address] [place], [place]  [address]

## 2012-06-06 NOTE — Patient Instructions (Addendum)
Stay on your current medicines  Try to be as active as possible  Need to minimize your caffeine intake  See Dr. Elease Hashimoto in 4 months  Call the [facility] office at [phone] if you have any questions, problems or concerns.

## 2012-06-09 ENCOUNTER — Encounter: Payer: Medicare Other | Admitting: Nurse Practitioner

## 2012-07-07 ENCOUNTER — Ambulatory Visit: Payer: Medicare Other | Admitting: Cardiovascular Disease

## 2012-07-12 ENCOUNTER — Emergency Department (HOSPITAL_COMMUNITY): Payer: Medicare Other

## 2012-07-12 ENCOUNTER — Inpatient Hospital Stay (HOSPITAL_COMMUNITY)
Admission: EM | Admit: 2012-07-12 | Discharge: 2012-07-15 | DRG: 064 | Disposition: A | Discharge status: Home Health | Payer: Medicare Other | Attending: Internal Medicine | Admitting: Internal Medicine

## 2012-07-12 ENCOUNTER — Encounter (HOSPITAL_COMMUNITY): Payer: Self-pay | Admitting: Emergency Medicine

## 2012-07-12 DIAGNOSIS — K648 Other hemorrhoids: Secondary | ICD-10-CM | POA: Diagnosis present

## 2012-07-12 DIAGNOSIS — R4182 Altered mental status, unspecified: Secondary | ICD-10-CM

## 2012-07-12 DIAGNOSIS — G459 Transient cerebral ischemic attack, unspecified: Secondary | ICD-10-CM

## 2012-07-12 DIAGNOSIS — I10 Essential (primary) hypertension: Secondary | ICD-10-CM | POA: Diagnosis present

## 2012-07-12 DIAGNOSIS — I2510 Atherosclerotic heart disease of native coronary artery without angina pectoris: Secondary | ICD-10-CM | POA: Diagnosis present

## 2012-07-12 DIAGNOSIS — E785 Hyperlipidemia, unspecified: Secondary | ICD-10-CM | POA: Diagnosis present

## 2012-07-12 DIAGNOSIS — R51 Headache: Secondary | ICD-10-CM

## 2012-07-12 DIAGNOSIS — K5730 Diverticulosis of large intestine without perforation or abscess without bleeding: Secondary | ICD-10-CM | POA: Diagnosis present

## 2012-07-12 DIAGNOSIS — G9340 Encephalopathy, unspecified: Secondary | ICD-10-CM | POA: Diagnosis present

## 2012-07-12 DIAGNOSIS — Z7982 Long term (current) use of aspirin: Secondary | ICD-10-CM

## 2012-07-12 DIAGNOSIS — M48061 Spinal stenosis, lumbar region without neurogenic claudication: Secondary | ICD-10-CM | POA: Diagnosis present

## 2012-07-12 DIAGNOSIS — Z8673 Personal history of transient ischemic attack (TIA), and cerebral infarction without residual deficits: Secondary | ICD-10-CM

## 2012-07-12 DIAGNOSIS — I6350 Cerebral infarction due to unspecified occlusion or stenosis of unspecified cerebral artery: Principal | ICD-10-CM | POA: Diagnosis present

## 2012-07-12 DIAGNOSIS — Z79899 Other long term (current) drug therapy: Secondary | ICD-10-CM

## 2012-07-12 DIAGNOSIS — M159 Polyosteoarthritis, unspecified: Secondary | ICD-10-CM | POA: Diagnosis present

## 2012-07-12 DIAGNOSIS — F411 Generalized anxiety disorder: Secondary | ICD-10-CM | POA: Diagnosis present

## 2012-07-12 DIAGNOSIS — E876 Hypokalemia: Secondary | ICD-10-CM | POA: Diagnosis present

## 2012-07-12 DIAGNOSIS — I639 Cerebral infarction, unspecified: Secondary | ICD-10-CM | POA: Diagnosis present

## 2012-07-12 DIAGNOSIS — R42 Dizziness and giddiness: Secondary | ICD-10-CM | POA: Diagnosis present

## 2012-07-12 LAB — POCT I-STAT, CHEM 8
BUN: 12 mg/dL (ref 6–23)
Calcium, Ion: 1.24 mmol/L (ref 1.13–1.30)
Chloride: 101 mEq/L (ref 96–112)
Creatinine, Ser: 0.9 mg/dL (ref 0.50–1.10)

## 2012-07-12 LAB — BASIC METABOLIC PANEL
BUN: 12 mg/dL (ref 6–23)
CO2: 27 mEq/L (ref 19–32)
Chloride: 98 mEq/L (ref 96–112)
GFR calc Af Amer: >90 mL/min (ref >90)
Potassium: 3.1 mEq/L — ABNORMAL LOW (ref 3.5–5.1)

## 2012-07-12 LAB — URINALYSIS, ROUTINE W REFLEX MICROSCOPIC
Bilirubin Urine: NEGATIVE
Hgb urine dipstick: NEGATIVE
Ketones, ur: NEGATIVE mg/dL
Nitrite: NEGATIVE
Protein, ur: NEGATIVE mg/dL
Specific Gravity, Urine: 1.006 (ref 1.005–1.030)
Urobilinogen, UA: 0.2 mg/dL (ref 0.0–1.0)

## 2012-07-12 LAB — CBC
HCT: 41.1 % (ref 36.0–46.0)
Hemoglobin: 14.1 g/dL (ref 12.0–15.0)
RBC: 4.54 MIL/uL (ref 3.87–5.11)
RDW: 14 % (ref 11.5–15.5)
WBC: 11.3 10*3/uL — ABNORMAL HIGH (ref 4.0–10.5)

## 2012-07-12 LAB — POCT I-STAT TROPONIN I: Troponin i, poc: 0.02 ng/mL (ref 0.00–0.08)

## 2012-07-12 LAB — HEPATIC FUNCTION PANEL
AST: 19 U/L (ref 0–37)
Albumin: 4.5 g/dL (ref 3.5–5.2)
Alkaline Phosphatase: 74 U/L (ref 39–117)
Total Protein: 7.6 g/dL (ref 6.0–8.3)

## 2012-07-12 LAB — URINE MICROSCOPIC-ADD ON

## 2012-07-12 LAB — GLUCOSE, CAPILLARY: Glucose-Capillary: 98 mg/dL (ref 70–99)

## 2012-07-12 LAB — GRAM STAIN

## 2012-07-12 MED ORDER — ENOXAPARIN SODIUM 40 MG/0.4ML ~~LOC~~ SOLN
40.0000 mg | SUBCUTANEOUS | Status: DC
  Administered 2012-07-13 – 2012-07-15 (×2): 40 mg via SUBCUTANEOUS
  Filled 2012-07-12 (×4): qty 0.4

## 2012-07-12 MED ORDER — ALBUTEROL SULFATE HFA 108 (90 BASE) MCG/ACT IN AERS: 2.0000 | INHALATION_SPRAY | Freq: Four times a day (QID) | RESPIRATORY_TRACT | Status: DC | PRN

## 2012-07-12 MED ORDER — ASPIRIN 325 MG PO TABS
325.0000 mg | ORAL_TABLET | Freq: Every day | ORAL | Status: DC
  Administered 2012-07-13 – 2012-07-14 (×2): 325 mg via ORAL
  Filled 2012-07-12 (×2): qty 1

## 2012-07-12 MED ORDER — POTASSIUM CHLORIDE CRYS ER 20 MEQ PO TBCR
40.0000 meq | EXTENDED_RELEASE_TABLET | Freq: Once | ORAL | Status: AC
  Administered 2012-07-12: 40 meq via ORAL
  Filled 2012-07-12: qty 1

## 2012-07-12 MED ORDER — FLUTICASONE PROPIONATE 50 MCG/ACT NA SUSP
1.0000 | Freq: Two times a day (BID) | NASAL | Status: DC
  Administered 2012-07-13 – 2012-07-15 (×5): 1 via NASAL
  Filled 2012-07-12 (×2): qty 16

## 2012-07-12 MED ORDER — POTASSIUM CHLORIDE CRYS ER 20 MEQ PO TBCR
20.0000 meq | EXTENDED_RELEASE_TABLET | Freq: Every day | ORAL | Status: DC
  Administered 2012-07-13: 20 meq via ORAL
  Filled 2012-07-12 (×2): qty 1

## 2012-07-12 MED ORDER — SERTRALINE HCL 50 MG PO TABS
50.0000 mg | ORAL_TABLET | Freq: Every day | ORAL | Status: DC
  Administered 2012-07-13 – 2012-07-15 (×3): 50 mg via ORAL
  Filled 2012-07-12 (×3): qty 1

## 2012-07-12 MED ORDER — NITROGLYCERIN 0.4 MG SL SUBL: 0.4000 mg | SUBLINGUAL_TABLET | SUBLINGUAL | Status: DC | PRN

## 2012-07-12 MED ORDER — MORPHINE SULFATE 4 MG/ML IJ SOLN
4.0000 mg | Freq: Once | INTRAMUSCULAR | Status: AC
  Administered 2012-07-12: 4 mg via INTRAVENOUS
  Filled 2012-07-12: qty 1

## 2012-07-12 MED ORDER — DIAZEPAM 5 MG PO TABS
5.0000 mg | ORAL_TABLET | Freq: Three times a day (TID) | ORAL | Status: DC | PRN
Start: 2012-07-12 — End: 2012-07-15
  Administered 2012-07-13 – 2012-07-15 (×6): 5 mg via ORAL
  Filled 2012-07-12 (×6): qty 1

## 2012-07-12 MED ORDER — POTASSIUM CHLORIDE 10 MEQ/100ML IV SOLN
10.0000 meq | Freq: Once | INTRAVENOUS | Status: AC
  Administered 2012-07-12: 10 meq via INTRAVENOUS
  Filled 2012-07-12: qty 100

## 2012-07-12 MED ORDER — POTASSIUM CHLORIDE CRYS ER 20 MEQ PO TBCR
EXTENDED_RELEASE_TABLET | ORAL | Status: AC
  Filled 2012-07-12: qty 1

## 2012-07-12 MED ORDER — HYDROCHLOROTHIAZIDE 25 MG PO TABS
25.0000 mg | ORAL_TABLET | Freq: Every day | ORAL | Status: DC
  Administered 2012-07-13 – 2012-07-15 (×3): 25 mg via ORAL
  Filled 2012-07-12 (×3): qty 1

## 2012-07-12 NOTE — ED Provider Notes (Signed)
History     CSN: [id number]  Arrival date & time 07/12/12  2115   First MD Initiated Contact with Patient 07/12/12 2131      Chief Complaint  Patient presents with  . Altered Mental Status  . Chest Pain    Patient is a 77 y.o. female presenting with altered mental status.  Altered Mental Status Presenting symptoms: confusion and disorientation   Severity:  Severe Most recent episode:  Today (onset about 5 pm, about 5 hours prior to arrival) Episode history:  Continuous Duration: about 5 hours, constant. Progression:  Unchanged Chronicity:  New Context: not alcohol use, not dementia, not head injury, not a recent change in medication, not a recent illness and not a recent infection   Associated symptoms: abdominal pain   Associated symptoms: no bladder incontinence, no decreased appetite, no difficulty breathing, no fever, no hallucinations, no nausea, no seizures, no slurred speech and no vomiting    Patient was felt to be normal by her daughter at about 5 pm.  However, just minutes later, her husband found her to be confused when talking on the telephone to a friend.  Prior to this, her daughter notes that she complained of some mild abdominal pain, but did not seem to be in any distress.  She did not have fever, chills, nausea, vomiting, diarrhea, or any other complaints.  Later, she complained of some "burning" in her upper chest.   Currently, the patient is unable to contribute significantly to the history of present illness due to confusion.  However, she does deny having any current pain.  Denies headache, neck pain, back pain, chest pain, shortness of breath, abdominal pain, or any injury.  Most of the history is provided by the patient's husband and the patient's daughter.   Past Medical History  Diagnosis Date  . Hypertension   . Diverticulosis   . Hemorrhoids, internal   . Anxiety   . Arthritis     osteoarthritis  . Vertigo   . Cerebrovascular accident, embolic  10/18/2010    Bilateral  . 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 Endocarditis 10/18/2010    treated with vancomycin, gentamicin, and Cipro  . Hypokalemia   . Insomnia   . Renal insufficiency 11/29/2010  . CAD (coronary artery disease)     a. admx with CP c/w [place] 4/14 => LHC: pLAD 40%, RCA with lum irregs => med Rx  . HLD (hyperlipidemia)     Past Surgical History  Procedure Laterality Date  . Cholecystectomy  09/29/1999  . Abdominal hysterectomy    . Bladder tuck    . Tee without cardioversion  10/10/2010     done with suggestion of small mitral vegetation.  ID was consulted for input and ID feels  patient with possible endocarditis with septic emboli    Family History  Problem Relation Age of Onset  . Coronary artery disease      family history of  . Colon cancer      family history of    History  Substance Use Topics  . Smoking status: Never Smoker   . Smokeless tobacco: Never Used  . Alcohol Use: No    OB History   Grav Para Term Preterm Abortions TAB SAB Ect Mult Living                  Review of Systems  Unable to perform ROS: Mental status change  Constitutional: Negative for fever and decreased  appetite.  Gastrointestinal: Positive for abdominal pain. Negative for nausea and vomiting.  Genitourinary: Negative for bladder incontinence.  Neurological: Negative for seizures.  Psychiatric/Behavioral: Positive for confusion and altered mental status. Negative for hallucinations.    Allergies  Amoxicillin; Penicillins; Tolectin; Ciprofloxacin; Prolactin; Bactrim; Codeine; Contrast media; and Procardia  Home Medications   Current Outpatient Rx  Name  Route  Sig  Dispense  Refill  . albuterol (PROVENTIL HFA;VENTOLIN HFA) 108 (90 BASE) MCG/ACT inhaler   Inhalation   Inhale 2 puffs into the lungs every 6 (six) hours as needed for wheezing or shortness of breath.         . Ascorbic Acid (VITAMIN C PO)   Oral   Take 2,000 mg by mouth  daily.         Marland Kitchen aspirin 81 MG tablet   Oral   Take 81 mg by mouth daily.           . diazepam (VALIUM) 5 MG tablet   Oral   Take 5 mg by mouth every 8 (eight) hours as needed for anxiety.          . Docosahexaenoic Acid (DHA COMPLETE PO)   Oral   Take 450 mg by mouth every other day.         . fluticasone (FLONASE) 50 MCG/ACT nasal spray   Nasal   Place 1 spray into the nose 2 (two) times daily.           . hydrochlorothiazide (HYDRODIURIL) 25 MG tablet   Oral   Take 1 tablet (25 mg total) by mouth daily.   30 tablet   6   . nitroGLYCERIN (NITROSTAT) 0.4 MG SL tablet   Sublingual   Place 1 tablet (0.4 mg total) under the tongue every 5 (five) minutes x 3 doses as needed for chest pain.   25 tablet   11   . potassium chloride (K-DUR,KLOR-CON) 10 MEQ tablet   Oral   Take 2 tablets (20 mEq total) by mouth daily.   30 tablet   6   . Selenium 200 MCG CAPS   Oral   Take 1 capsule by mouth every other day.         . sertraline (ZOLOFT) 100 MG tablet   Oral   Take 50 mg by mouth daily.          Marland Kitchen zinc gluconate 50 MG tablet   Oral   Take 50 mg by mouth daily.           BP 141/78  Pulse 85  Temp(Src) 98.3 F (36.8 C) (Oral)  Resp 18  SpO2 97%  Physical Exam  Nursing note and vitals reviewed. Constitutional: She appears well-developed and well-nourished. No distress.  Confused, disoriented, asking "who are you?, where am I? What kind of place is this?"  HENT:  Head: Normocephalic and atraumatic.  Mouth/Throat: Oropharynx is clear and moist.  Eyes: Conjunctivae and EOM are normal. Pupils are equal, round, and reactive to light. No scleral icterus.  Neck: Normal range of motion. Neck supple. No JVD present.  Cardiovascular: Normal rate, regular rhythm, normal heart sounds and intact distal pulses.  Exam reveals no gallop and no friction rub.   No murmur heard. Pulmonary/Chest: Effort normal and breath sounds normal. No respiratory distress. She  has no wheezes. She has no rales.  Abdominal: Soft. Bowel sounds are normal. She exhibits no distension. There is no tenderness. There is no rebound and no guarding.  Musculoskeletal: She  exhibits no edema.  Neurological: She is alert. She has normal strength and normal reflexes. She is disoriented (oriented to person, but not place, time, or situation). She displays no tremor. No cranial nerve deficit or sensory deficit. She exhibits normal muscle tone. She displays no seizure activity. Coordination normal. GCS eye subscore is 4. GCS verbal subscore is 4. GCS motor subscore is 6.  5/5 strength in bilateral UE and LE. Visual fields intact to confrontation. Normal finger to nose. Gait deferred.  Skin: Skin is warm and dry. She is not diaphoretic.    ED Course  Procedures (including critical care time)  Labs Reviewed  CBC - Abnormal; Notable for the following:    WBC 11.3 (*)    All other components within normal limits  BASIC METABOLIC PANEL - Abnormal; Notable for the following:    Potassium 3.1 (*)    Glucose, Bld 106 (*)    GFR calc non Af Amer 84 (*)    All other components within normal limits  URINALYSIS, ROUTINE W REFLEX MICROSCOPIC - Abnormal; Notable for the following:    Leukocytes, UA MODERATE (*)    All other components within normal limits  POCT I-STAT, CHEM 8 - Abnormal; Notable for the following:    Potassium 3.0 (*)    Glucose, Bld 101 (*)    All other components within normal limits  GRAM STAIN  HEPATIC FUNCTION PANEL  GLUCOSE, CAPILLARY  URINE MICROSCOPIC-ADD ON  URINE RAPID DRUG SCREEN (HOSP PERFORMED)  POCT I-STAT TROPONIN I   Ct Head Wo Contrast  07/12/2012   *RADIOLOGY REPORT*  Clinical Data: Altered mental status, prior stroke  CT HEAD WITHOUT CONTRAST  Technique:  Contiguous axial images were obtained from the base of the skull through the vertex without contrast.  Comparison: 10/07/2010  Findings: Tiny left external capsule lacunar infarcts and right  internal capsule anterior limb lacunar infarcts again noted. No acute hemorrhage, acute infarction, or mass lesion is seen.  Left maxillary sinusitis noted.  No skull fracture.  IMPRESSION: No acute intracranial finding.  Left maxillary sinusitis.   Original Report Authenticated By: Christiana Pellant, M.D.   Dg Chest Port 1 View  07/12/2012   *RADIOLOGY REPORT*  Clinical Data: Altered mental status; chest pain.  PORTABLE CHEST - 1 VIEW  Comparison: Chest radiograph performed 05/23/2012  Findings: The lungs are well-aerated and clear.  There is no evidence of focal opacification, pleural effusion or pneumothorax.  The cardiomediastinal silhouette is normal in size; calcification is noted in the aortic arch.  No acute osseous abnormalities are seen.  IMPRESSION: No acute cardiopulmonary process seen.   Original Report Authenticated By: Tonia Ghent, M.D.     1. Altered mental status       MDM  And a 77 year old female with a history of septic emboli causing strokes secondary to infective endocarditis presents complaining of altered metal status. Was acute onset earlier today. She also complained of some vague abdominal and chest discomfort. On arrival she is hypertensive to 169/91, vitals otherwise within normal limits, she is nontoxic appearing in no acute distress, head is atraumatic, no meningismus, she has a nonfocal neurological exam, cardiopulmonary exam is unremarkable, no localizing signs of infection.  CT head negative for acute intracranial abnormality. What blood cell count 11.3, potassium 3.1, UA not suggestive of UTI, troponin negative x1. Chest x-ray shows no acute cardiopulmonary process.  No obvious etiology for this patient's altered no status on initial workup. Differential would include infectious causes, stroke similar to  her prior multifocal septic emboli from endocarditis. She's admitted to internal medicine for further workup and management.        Toney Sang,  MD 07/12/12 [phone]

## 2012-07-12 NOTE — ED Notes (Signed)
Per family pt has burning sensation that starts upper abdominal and radiates substernal chest. Pt is alert, but she is not oriented to place, time, or situation, according to family this is new. No slurred speech noticeable. Pt can follow commands

## 2012-07-12 NOTE — ED Notes (Signed)
Per  Family- Patient last known well around 80. Around 1730 patient started reporting abdominal pain. Patient currently confused and not alert to situation. History of CVA August 2012. NAD at this time.

## 2012-07-12 NOTE — H&P (Signed)
[facility] History and Physical  Frances Mendoza WUJ:[id number] DOB: 08-10-1935 DOA: 07/12/2012  Referring physician: EDP PCP: Carmin Richmond, MD  Specialists:   Chief Complaint: Confusion  HPI: Frances Mendoza is a 77 y.o. female with acute onset of confusion at 5 pm.  Her husband heard her talking on the phone and she was not making sense.  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  She does not recall what had happened, she reports having a headache and some burning chest discomfort across her chest.  She began to return back to her baseline according to her family while in the ED.   A CT scan of the Head was performed which was negative for acute findings.    Review of Systems: The patient denies anorexia, fever, weight loss, vision loss, decreased hearing, hoarseness, chest pain, syncope, dyspnea on exertion, peripheral edema, balance deficits, hemoptysis, abdominal pain, melena, hematochezia, severe indigestion/heartburn, hematuria, incontinence, muscle weakness, suspicious skin lesions, transient blindness, difficulty walking, depression, unusual weight change, abnormal bleeding, enlarged lymph nodes, angioedema, and breast masses.    Past Medical History  Diagnosis Date  . Hypertension   . Diverticulosis   . Hemorrhoids, internal   . Anxiety   . Arthritis     osteoarthritis  . Vertigo   . 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 Endocarditis 10/18/2010    treated with vancomycin, gentamicin, and Cipro  . Hypokalemia   . Insomnia   . Renal insufficiency 11/29/2010  . CAD (coronary artery disease)     a. admx with CP c/w [place] 4/14 => LHC: pLAD 40%, RCA with lum irregs => med Rx  . HLD (hyperlipidemia)      Past Surgical History  Procedure Laterality Date  . Cholecystectomy  09/29/1999  . Abdominal hysterectomy    . Bladder tuck    . Tee without cardioversion   10/10/2010     done with suggestion of small mitral vegetation.  ID was consulted for input and ID feels  patient with possible endocarditis with septic emboli    Medications:  HOME MEDS: Prior to Admission medications   Medication Sig Start Date End Date Taking? Authorizing Provider  albuterol (PROVENTIL HFA;VENTOLIN HFA) 108 (90 BASE) MCG/ACT inhaler Inhale 2 puffs into the lungs every 6 (six) hours as needed for wheezing or shortness of breath.   Yes Historical Provider, MD  Ascorbic Acid (VITAMIN C PO) Take 2,000 mg by mouth daily.   Yes Historical Provider, MD  aspirin 81 MG tablet Take 81 mg by mouth daily.     Yes Historical Provider, MD  diazepam (VALIUM) 5 MG tablet Take 5 mg by mouth every 8 (eight) hours as needed for anxiety.    Yes Historical Provider, MD  Docosahexaenoic Acid (DHA COMPLETE PO) Take 450 mg by mouth every other day.   Yes Historical Provider, MD  fluticasone (FLONASE) 50 MCG/ACT nasal spray Place 1 spray into the nose 2 (two) times daily.     Yes Historical Provider, MD  hydrochlorothiazide (HYDRODIURIL) 25 MG tablet Take 1 tablet (25 mg total) by mouth daily. 05/02/12 05/02/13 Yes Vesta Mixer, MD  nitroGLYCERIN (NITROSTAT) 0.4 MG SL tablet Place 1 tablet (0.4 mg total) under the tongue every 5 (five) minutes x 3 doses as needed for chest pain. 05/24/12  Yes Beatrice Lecher, PA-C  potassium chloride (K-DUR,KLOR-CON) 10 MEQ tablet Take  2 tablets (20 mEq total) by mouth daily. 05/24/12 05/24/13 Yes Scott T Weaver, PA-C  Selenium 200 MCG CAPS Take 1 capsule by mouth every other day.   Yes Historical Provider, MD  sertraline (ZOLOFT) 100 MG tablet Take 50 mg by mouth daily.    Yes Historical Provider, MD  zinc gluconate 50 MG tablet Take 50 mg by mouth daily.   Yes Historical Provider, MD    Allergies:  Allergies  Allergen Reactions  . Amoxicillin Anaphylaxis  . Penicillins Anaphylaxis  . Tolectin (Tolmetin Sodium) Anaphylaxis    Took at same time as amoxicillin when  she had anaphylactic reaction.  . Ciprofloxacin Rash    Entire body looked as if sunburned, then skin peeled off.  . Prolactin   . Bactrim (Sulfamethoxazole W-Trimethoprim) Rash    ? Early rash on palms  . Codeine Nausea And Vomiting  . Contrast Media (Iodinated Diagnostic Agents) Hives    Patient had severe hives after Myelogram.  No issues breathing.  Returned for  a nerve root injection, was given Benadryl, mentioned some minor itching to daughter on the way home but no further issues. Was given a Benadryl today for this injection.   . Procardia (Nifedipine) Other (See Comments)    Causes aches down legs    Social History:   reports that she has never smoked. She has never used smokeless tobacco. She reports that she does not drink alcohol or use illicit drugs.    Family History: Family History  Problem Relation Age of Onset  . Coronary artery disease      family history of  . Colon cancer      family history of      Physical Exam:  GEN: Pleasant 77 year old well nourished and well developed Elderly Caucasian Female examined  and in no acute distress; cooperative with exam Filed Vitals:   07/12/12 2300 07/12/12 2303 07/12/12 2315 07/12/12 2320  BP:  160/86 141/78   Pulse: 86 87 85   Temp:    98.3 F (36.8 C)  TempSrc:      Resp: 21 18 18    SpO2: 95% 95% 97%    Blood pressure 141/78, pulse 85, temperature 98.3 F (36.8 C), temperature source Oral, resp. rate 18, SpO2 97.00%. PSYCH: She is alert and oriented x4; does not appear anxious does not appear depressed; affect is normal HEENT: Normocephalic and Atraumatic, Mucous membranes pink; PERRLA; EOM intact; Fundi:  Benign;  No scleral icterus, Nares: Patent, Oropharynx: Clear, Fair Dentition, Neck:  FROM, no cervical lymphadenopathy nor thyromegaly or carotid bruit; no JVD; Breasts:: Not examined CHEST WALL: No tenderness CHEST: Normal respiration, clear to auscultation bilaterally HEART: Regular rate and rhythm; no  murmurs rubs or gallops BACK: No kyphosis or scoliosis; no CVA tenderness ABDOMEN: Positive Bowel Sounds, soft non-tender; no masses, no organomegaly.   Rectal Exam: Not done EXTREMITIES: No cyanosis, clubbing or edema; no ulcerations. Genitalia: not examined PULSES: 2+ and symmetric SKIN: Normal hydration no rash or ulceration CNS: Cranial nerves 2-12 grossly intact no focal neurologic deficit   Labs & Imaging Results for orders placed during the hospital encounter of 07/12/12 (from the past 48 hour(s))  GLUCOSE, CAPILLARY     Status: None   Collection Time    07/12/12  9:33 PM      Result Value Range   Glucose-Capillary 98  70 - 99 mg/dL  CBC     Status: Abnormal   Collection Time    07/12/12  9:35  PM      Result Value Range   WBC 11.3 (*) 4.0 - 10.5 K/uL   RBC 4.54  3.87 - 5.11 MIL/uL   Hemoglobin 14.1  12.0 - 15.0 g/dL   HCT 16.1  09.6 - 04.5 %   MCV 90.5  78.0 - 100.0 fL   MCH 31.1  26.0 - 34.0 pg   MCHC 34.3  30.0 - 36.0 g/dL   RDW 40.9  81.1 - 91.4 %   Platelets 251  150 - 400 K/uL  BASIC METABOLIC PANEL     Status: Abnormal   Collection Time    07/12/12  9:35 PM      Result Value Range   Sodium 138  135 - 145 mEq/L   Potassium 3.1 (*) 3.5 - 5.1 mEq/L   Chloride 98  96 - 112 mEq/L   CO2 27  19 - 32 mEq/L   Glucose, Bld 106 (*) 70 - 99 mg/dL   BUN 12  6 - 23 mg/dL   Creatinine, Ser 7.82  0.50 - 1.10 mg/dL   Calcium 95.6  8.4 - 21.3 mg/dL   GFR calc non Af Amer 84 (*) >90 mL/min   GFR calc Af Amer >90  >90 mL/min   Comment:            The eGFR has been calculated     using the CKD EPI equation.     This calculation has not been     validated in all clinical     situations.     eGFR's persistently     <90 mL/min signify     possible Chronic Kidney Disease.  HEPATIC FUNCTION PANEL     Status: None   Collection Time    07/12/12  9:35 PM      Result Value Range   Total Protein 7.6  6.0 - 8.3 g/dL   Albumin 4.5  3.5 - 5.2 g/dL   AST 19  0 - 37 U/L   ALT 12   0 - 35 U/L   Alkaline Phosphatase 74  39 - 117 U/L   Total Bilirubin 0.3  0.3 - 1.2 mg/dL   Bilirubin, Direct <0.8  0.0 - 0.3 mg/dL   Indirect Bilirubin NOT CALCULATED  0.3 - 0.9 mg/dL  URINALYSIS, ROUTINE W REFLEX MICROSCOPIC     Status: Abnormal   Collection Time    07/12/12 10:10 PM      Result Value Range   Color, Urine YELLOW  YELLOW   APPearance CLEAR  CLEAR   Specific Gravity, Urine 1.006  1.005 - 1.030   pH 7.5  5.0 - 8.0   Glucose, UA NEGATIVE  NEGATIVE mg/dL   Hgb urine dipstick NEGATIVE  NEGATIVE   Bilirubin Urine NEGATIVE  NEGATIVE   Ketones, ur NEGATIVE  NEGATIVE mg/dL   Protein, ur NEGATIVE  NEGATIVE mg/dL   Urobilinogen, UA 0.2  0.0 - 1.0 mg/dL   Nitrite NEGATIVE  NEGATIVE   Leukocytes, UA MODERATE (*) NEGATIVE  GRAM STAIN     Status: None   Collection Time    07/12/12 10:10 PM      Result Value Range   Specimen Description URINE, RANDOM     Special Requests NONE     Gram Stain       Value: CYTOSPIN URINE     WBC PRESENT, PREDOMINANTLY MONONUCLEAR     NO ORGANISMS SEEN     SQUAMOUS EPITHELIAL CELLS PRESENT     NOTIFIED FORREST,Z  RN 07/12/2012 2303 JORDANS   Report Status 07/12/2012 FINAL    URINE MICROSCOPIC-ADD ON     Status: None   Collection Time    07/12/12 10:10 PM      Result Value Range   Squamous Epithelial / LPF RARE  RARE   WBC, UA 3-6  <3 WBC/hpf   RBC / HPF 0-2  <3 RBC/hpf   Bacteria, UA RARE  RARE  POCT I-STAT TROPONIN I     Status: None   Collection Time    07/12/12 10:11 PM      Result Value Range   Troponin i, poc 0.02  0.00 - 0.08 ng/mL   Comment 3            Comment: Due to the release kinetics of cTnI,     a negative result within the first hours     of the onset of symptoms does not rule out     myocardial infarction with certainty.     If myocardial infarction is still suspected,     repeat the test at appropriate intervals.  POCT I-STAT, CHEM 8     Status: Abnormal   Collection Time    07/12/12 10:13 PM      Result Value  Range   Sodium 139  135 - 145 mEq/L   Potassium 3.0 (*) 3.5 - 5.1 mEq/L   Chloride 101  96 - 112 mEq/L   BUN 12  6 - 23 mg/dL   Creatinine, Ser 1.61  0.50 - 1.10 mg/dL   Glucose, Bld 096 (*) 70 - 99 mg/dL   Calcium, Ion 0.45  4.09 - 1.30 mmol/L   TCO2 29  0 - 100 mmol/L   Hemoglobin 14.3  12.0 - 15.0 g/dL   HCT 81.1  91.4 - 78.2 %        Radiological Exams on Admission: Ct Head Wo Contrast  07/12/2012   *RADIOLOGY REPORT*  Clinical Data: Altered mental status, prior stroke  CT HEAD WITHOUT CONTRAST  Technique:  Contiguous axial images were obtained from the base of the skull through the vertex without contrast.  Comparison: 10/07/2010  Findings: Tiny left external capsule lacunar infarcts and right internal capsule anterior limb lacunar infarcts again noted. No acute hemorrhage, acute infarction, or mass lesion is seen.  Left maxillary sinusitis noted.  No skull fracture.  IMPRESSION: No acute intracranial finding.  Left maxillary sinusitis.   Original Report Authenticated By: Christiana Pellant, M.D.   Dg Chest Port 1 View  07/12/2012   *RADIOLOGY REPORT*  Clinical Data: Altered mental status; chest pain.  PORTABLE CHEST - 1 VIEW  Comparison: Chest radiograph performed 05/23/2012  Findings: The lungs are well-aerated and clear.  There is no evidence of focal opacification, pleural effusion or pneumothorax.  The cardiomediastinal silhouette is normal in size; calcification is noted in the aortic arch.  No acute osseous abnormalities are seen.  IMPRESSION: No acute cardiopulmonary process seen.   Original Report Authenticated By: Tonia Ghent, M.D.    EKG:  Normal Sinus rhythm  No acute S-T changes  Assessment/Plan Principal Problem:   TIA (transient ischemic attack) Active Problems:   Acute encephalopathy   Coronary artery disease   Hypokalemia   Hypertension   Headache    1.  TIA-  TIA workup initiated, Neuro checks, MRI/MRA and Carotid US ordered and ASA Rx.    2.  Acute  Encephalopathy-  Neuro checks, and monitor for changes.     3.  CAD-  Cycle troponins.  EKG without changes.    4.  Hypokalemia-  Replete K+, and check Magnesium.    5.  Headache-  IV Morphine PRN, Monitor  6.  HTN- continue HCTZ.     7.  Lovenox for DVT prophylaxis.     Code Status:    FULL CODE Family Communication:    Family at bedside     Disposition Plan:   Return to Home on Discharge      Time spent:   70 Minutes  Ron Parker [facility] Pager [phone]  If 7PM-7AM, please contact night-coverage [email or web address] Password [facility] 07/12/2012, 11:40 PM

## 2012-07-12 NOTE — ED Provider Notes (Signed)
I saw and evaluated the patient, reviewed the resident's note and I agree with the findings and plan.  Pt seen and examined, here with ams, neg head ct, will need MRI and inpt w/u  Toy Baker, MD 07/12/12 2307

## 2012-07-13 ENCOUNTER — Observation Stay (HOSPITAL_COMMUNITY): Payer: Medicare Other

## 2012-07-13 DIAGNOSIS — R4182 Altered mental status, unspecified: Secondary | ICD-10-CM

## 2012-07-13 DIAGNOSIS — I2510 Atherosclerotic heart disease of native coronary artery without angina pectoris: Secondary | ICD-10-CM

## 2012-07-13 DIAGNOSIS — G459 Transient cerebral ischemic attack, unspecified: Secondary | ICD-10-CM

## 2012-07-13 DIAGNOSIS — I059 Rheumatic mitral valve disease, unspecified: Secondary | ICD-10-CM

## 2012-07-13 LAB — RAPID URINE DRUG SCREEN, HOSP PERFORMED
Amphetamines: NOT DETECTED
Benzodiazepines: NOT DETECTED
Opiates: NOT DETECTED

## 2012-07-13 LAB — LIPID PANEL
Total CHOL/HDL Ratio: 3.8 RATIO
VLDL: 38 mg/dL (ref 0–40)

## 2012-07-13 LAB — TROPONIN I
Troponin I: <0.3 ng/mL (ref <0.30)
Troponin I: <0.3 ng/mL (ref <0.30)

## 2012-07-13 LAB — MAGNESIUM: Magnesium: 2.1 mg/dL (ref 1.5–2.5)

## 2012-07-13 MED ORDER — GI COCKTAIL ~~LOC~~
30.0000 mL | Freq: Three times a day (TID) | ORAL | Status: DC | PRN
  Filled 2012-07-13: qty 30

## 2012-07-13 MED ORDER — SIMVASTATIN 20 MG PO TABS
20.0000 mg | ORAL_TABLET | Freq: Every day | ORAL | Status: DC
  Administered 2012-07-13: 20 mg via ORAL
  Filled 2012-07-13 (×3): qty 1

## 2012-07-13 MED ORDER — ONDANSETRON HCL 4 MG/2ML IJ SOLN: 4.0000 mg | Freq: Three times a day (TID) | INTRAMUSCULAR | Status: AC | PRN

## 2012-07-13 MED ORDER — ACETAMINOPHEN 325 MG PO TABS: 650.0000 mg | ORAL_TABLET | ORAL | Status: DC | PRN

## 2012-07-13 MED ORDER — IBUPROFEN 400 MG PO TABS
400.0000 mg | ORAL_TABLET | Freq: Once | ORAL | Status: AC
  Administered 2012-07-13: 400 mg via ORAL
  Filled 2012-07-13: qty 1

## 2012-07-13 MED ORDER — MORPHINE SULFATE 2 MG/ML IJ SOLN
2.0000 mg | Freq: Once | INTRAMUSCULAR | Status: AC
  Administered 2012-07-13: 2 mg via INTRAVENOUS
  Filled 2012-07-13: qty 1

## 2012-07-13 MED ORDER — MORPHINE SULFATE 2 MG/ML IJ SOLN
1.0000 mg | Freq: Once | INTRAMUSCULAR | Status: AC
  Administered 2012-07-13: 1 mg via INTRAVENOUS
  Filled 2012-07-13: qty 1

## 2012-07-13 MED ORDER — GI COCKTAIL ~~LOC~~
30.0000 mL | Freq: Once | ORAL | Status: AC
  Administered 2012-07-13: 30 mL via ORAL
  Filled 2012-07-13: qty 30

## 2012-07-13 NOTE — Progress Notes (Signed)
*  PRELIMINARY RESULTS* Vascular Ultrasound Carotid Doppler has been completed.  Preliminary findings: Bilateral:  No evidence of hemodynamically significant internal carotid artery stenosis.   Vertebral artery flow is antegrade.      Farrel Demark, RDMS, RVT  07/13/2012, 11:06 AM

## 2012-07-13 NOTE — Progress Notes (Signed)
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  Assessment/Plan: AMS Appears to be related to TIA. symptoms reolved   Head CT negative. MRI brain and 2D echo  pending. Carotid doppler negative.  -Continue ASA. Added statin given elevated LDL Serial CE negative PT eval  Hypokalemia  replete k   CAD  continue ASA, serial CE negative. Recent cath for unstable angina with 405 LAD stenosis. managed medically.   HTN  continue home meds  DVT prophylaxis  Code Status: full Family Communication: husband and daughter at bedside Disposition Plan:home once w/up completed   Consultants:  none  Procedures:  none  Antibiotics:  none  HPI/Subjective: denies any symptoms. No overnight sisues  Objective: Filed Vitals:   07/13/12 0115 07/13/12 0145 07/13/12 0600 07/13/12 1045  BP: 132/65 121/69 135/65 113/61  Pulse: 87 86 70 91  Temp:   97.7 F (36.5 C) 98.5 F (36.9 C)  TempSrc:    Oral  Resp: 16 14 16 16   Height:  5\' 3"  (1.6 m)    Weight:  79.334 kg (174 lb 14.4 oz)    SpO2: 94% 92% 100% 97%   No intake or output data in the 24 hours ending 07/13/12 1534 Filed Weights   07/13/12 0145  Weight: 79.334 kg (174 lb 14.4 oz)    Exam:   General:  Elderly female in NAD  HEENT: no pallor, moist mucosa  Chest : clear b/l, no added sounds  Cardiovascular: NS1&S2, no murmurs  Abdomen: soft, NT, ND, BS+  Musculoskeletal: Warm, no edema  CNS: AAOX3  Data Reviewed: Basic Metabolic Panel:  Recent Labs Lab 07/12/12 2135 07/12/12 2213 07/13/12 0345  NA 138 139  --   K 3.1* 3.0*  --   CL 98 101  --   CO2 27  --   --   GLUCOSE 106* 101*  --   BUN 12 12  --    CREATININE 0.66 0.90  --   CALCIUM 10.5  --   --   MG  --   --  2.1   Liver Function Tests:  Recent Labs Lab 07/12/12 2135  AST 19  ALT 12  ALKPHOS 74  BILITOT 0.3  PROT 7.6  ALBUMIN 4.5   No results found for this basename: LIPASE, AMYLASE,  in the last 168 hours No results found for this basename: AMMONIA,  in the last 168 hours CBC:  Recent Labs Lab 07/12/12 2135 07/12/12 2213  WBC 11.3*  --   HGB 14.1 14.3  HCT 41.1 42.0  MCV 90.5  --   PLT 251  --    Cardiac Enzymes:  Recent Labs Lab 07/12/12 2357 07/13/12 0545 07/13/12 1157  TROPONINI <0.30 <0.30 <0.30   BNP (last 3 results) No results found for this basename: PROBNP,  in the last 8760 hours CBG:  Recent Labs Lab 07/12/12 2133 07/13/12 0651  GLUCAP 98 101*    Recent Results (from the past 240 hour(s))  GRAM STAIN     Status: None   Collection Time    07/12/12 10:10 PM      Result Value Range Status   Specimen Description URINE, RANDOM   Final   Special Requests NONE   Final   Gram Stain  Final   Value: CYTOSPIN URINE     WBC PRESENT, PREDOMINANTLY MONONUCLEAR     NO ORGANISMS SEEN     SQUAMOUS EPITHELIAL CELLS PRESENT     NOTIFIED Frances Mendoza,Frances Mendoza Frances Mendoza 07/12/2012 2303 Frances Mendoza   Report Status 07/12/2012 FINAL   Final     Studies: Ct Head Wo Contrast  07/12/2012   *RADIOLOGY REPORT*  Clinical Data: Altered mental status, prior stroke  CT HEAD WITHOUT CONTRAST  Technique:  Contiguous axial images were obtained from the base of the skull through the vertex without contrast.  Comparison: 10/07/2010  Findings: Tiny left external capsule lacunar infarcts and right internal capsule anterior limb lacunar infarcts again noted. No acute hemorrhage, acute infarction, or mass lesion is seen.  Left maxillary sinusitis noted.  No skull fracture.  IMPRESSION: No acute intracranial finding.  Left maxillary sinusitis.   Original Report Authenticated By: Frances Mendoza, M.D.   Dg Chest Port 1 View  07/12/2012    *RADIOLOGY REPORT*  Clinical Data: Altered mental status; chest pain.  PORTABLE CHEST - 1 VIEW  Comparison: Chest radiograph performed 05/23/2012  Findings: The lungs are well-aerated and clear.  There is no evidence of focal opacification, pleural effusion or pneumothorax.  The cardiomediastinal silhouette is normal in size; calcification is noted in the aortic arch.  No acute osseous abnormalities are seen.  IMPRESSION: No acute cardiopulmonary process seen.   Original Report Authenticated By: Frances Mendoza, M.D.    Scheduled Meds: . aspirin  325 mg Oral Daily  . enoxaparin (LOVENOX) injection  40 mg Subcutaneous Q24H  . fluticasone  1 spray Each Nare BID  . hydrochlorothiazide  25 mg Oral Daily  . potassium chloride  20 mEq Oral Daily  . sertraline  50 mg Oral Daily  . simvastatin  20 mg Oral q1800   Continuous Infusions:     Time spent: 25 minutes    Frances Mendoza  [facility] Pager [phone] If 7PM-7AM, please contact night-coverage at [email or web address], password [facility] 07/13/2012, 3:34 PM  LOS: 1 day

## 2012-07-13 NOTE — ED Notes (Signed)
Patient reports 5/10 CP. States pain is a heavy and burning feeling.

## 2012-07-13 NOTE — Progress Notes (Signed)
Utilization review completed.  P.J. Maeby Vankleeck,RN,BSN Case Manager [phone]  

## 2012-07-13 NOTE — ED Notes (Signed)
Pt placed on O2 via n/c at 4lpm due to O2 stats being 88-89%

## 2012-07-13 NOTE — ED Notes (Signed)
Assisted pt with the use of bed pan.Pt requested more water to drink and warm blanket. Pt is given water and some more blankets.

## 2012-07-13 NOTE — ED Provider Notes (Signed)
I saw and evaluated the patient, reviewed the resident's note and I agree with the findings and plan.  Jeanmarc Viernes T Raymond Azure, MD 07/13/12 1602 

## 2012-07-14 ENCOUNTER — Observation Stay (HOSPITAL_COMMUNITY): Payer: Medicare Other

## 2012-07-14 DIAGNOSIS — E876 Hypokalemia: Secondary | ICD-10-CM

## 2012-07-14 DIAGNOSIS — I6350 Cerebral infarction due to unspecified occlusion or stenosis of unspecified cerebral artery: Principal | ICD-10-CM

## 2012-07-14 LAB — BASIC METABOLIC PANEL
BUN: 12 mg/dL (ref 6–23)
CO2: 31 mEq/L (ref 19–32)
Chloride: 98 mEq/L (ref 96–112)
Creatinine, Ser: 0.7 mg/dL (ref 0.50–1.10)

## 2012-07-14 MED ORDER — WHITE PETROLATUM GEL
Status: AC
  Administered 2012-07-14: 0.2
  Filled 2012-07-14: qty 5

## 2012-07-14 MED ORDER — CLOPIDOGREL BISULFATE 75 MG PO TABS
75.0000 mg | ORAL_TABLET | Freq: Every day | ORAL | Status: DC
  Administered 2012-07-15: 75 mg via ORAL
  Filled 2012-07-14 (×2): qty 1

## 2012-07-14 MED ORDER — POTASSIUM CHLORIDE CRYS ER 20 MEQ PO TBCR
40.0000 meq | EXTENDED_RELEASE_TABLET | Freq: Every day | ORAL | Status: DC
  Administered 2012-07-14 – 2012-07-15 (×2): 40 meq via ORAL
  Filled 2012-07-14 (×2): qty 2

## 2012-07-14 NOTE — Evaluation (Signed)
Physical Therapy Evaluation Patient Details Name: Frances Mendoza MRN: [id number] DOB: Jan 29, 1936 Today's Date: 07/14/2012 Time: 4098-1191 PT Time Calculation (min): 34 min  PT Assessment / Plan / Recommendation Clinical Impression  77 y/o female admitted to ED 07/12/12 with acute onset of confusion after her husband heard her talking on the phone and she was not making sense. History of multiple CVAs in 2012, and does not recall why she was admitted. Awaiting MRI results to determine if acute infarct occured.   Pt presents to PT with memory deficits different from daughter reported baseline. Pt independent in bed mobility, but requiring supervision for transfers and ambulation with RW due to decreased saftey awareness. Education on fall risk and home care. Family able to provide 24hr support initially. Recommend acute PT to maximize functional independence and saftey for d/c home.     PT Assessment  Patient needs continued PT services    Follow Up Recommendations  Home health PT;Supervision/Assistance - 24 hour       Barriers to Discharge None            Frequency Min 4X/week    Precautions / Restrictions Precautions Precautions: Fall   Pertinent Vitals/Pain Pt verbalizing 7/10 leg pain. Pt repositioned and ambulated with no visible indications of pain.      Mobility  Bed Mobility Bed Mobility: Supine to Sit;Sitting - Scoot to Edge of Bed Supine to Sit: 6: Modified independent (Device/Increase time) Sitting - Scoot to Edge of Bed: 5: Supervision Transfers Transfers: Sit to Stand;Stand to Sit Sit to Stand: 5: Supervision Stand to Sit: 5: Supervision;To chair/3-in-1 Details for Transfer Assistance: supervision for saftey due to balance deficits in standing. V/c to reach for arm rests when sitting down Ambulation/Gait Ambulation/Gait Assistance: 4: Min assist;4: Min guard Assistive device: None;Rolling walker Ambulation/Gait Assistance Details: Min assist for path deviations  to the right when not using RW. When using RW, gait speed increased and providing min guard for saftey. V/c for proper use of RW. Gait Pattern: Step-through pattern;Decreased hip/knee flexion - right;Decreased hip/knee flexion - left Gait velocity: WFL, but pt needing v/c to slow down to decrease fall risk Modified Rankin (Stroke Patients Only) Pre-Morbid Rankin Score: Slight disability Modified Rankin: Moderately severe disability        PT Diagnosis: Difficulty walking;Altered mental status  PT Problem List: Decreased balance;Decreased mobility;Decreased cognition;Decreased knowledge of precautions;Decreased safety awareness;Decreased knowledge of use of DME PT Treatment Interventions: DME instruction;Gait training;Stair training;Functional mobility training;Therapeutic activities;Therapeutic exercise;Balance training;Neuromuscular re-education;Patient/family education   PT Goals Acute Rehab PT Goals PT Goal Formulation: With patient Time For Goal Achievement: 07/21/12 Potential to Achieve Goals: Good Pt will go Sit to Stand: with modified independence PT Goal: Sit to Stand - Progress: Goal set today Pt will go Stand to Sit: with modified independence PT Goal: Stand to Sit - Progress: Goal set today Pt will Ambulate: >150 feet;with least restrictive assistive device;with modified independence PT Goal: Ambulate - Progress: Goal set today  Visit Information  Last PT Received On: 07/14/12 Assistance Needed: +1    Subjective Data  Subjective: "I cant remember why they brought me here and that really bugs me" Patient Stated Goal: return home   Prior Functioning  Home Living Lives With: Spouse Available Help at Discharge: Family;Available 24 hours/day Type of Home: House Home Access: Level entry Home Layout: Two level;Able to live on main level with bedroom/bathroom Bathroom Shower/Tub: Tub only Bathroom Toilet: Handicapped height Home Adaptive Equipment: Walker -  rolling;Straight cane;Shower chair with  back Additional Comments: home equipment from previous history of stroke Prior Function Level of Independence: Independent with assistive device(s) Driving: No Communication Communication: No difficulties    Cognition  Cognition Arousal/Alertness: Awake/alert Behavior During Therapy: WFL for tasks assessed/performed Overall Cognitive Status: Impaired/Different from baseline Area of Impairment: Memory;Safety/judgement;Orientation Orientation Level: Disoriented to;Time (pt stating it was June 13, 2012) Memory: Decreased short-term memory Safety/Judgement: Decreased awareness of safety General Comments: Pt daughter states some memory deficits at baseline, but that pt is still different from baseline.    Extremity/Trunk Assessment Right Upper Extremity Assessment RUE ROM/Strength/Tone: WFL for tasks assessed Left Upper Extremity Assessment LUE ROM/Strength/Tone: WFL for tasks assessed Right Lower Extremity Assessment RLE ROM/Strength/Tone: Deficits;WFL for tasks assessed RLE ROM/Strength/Tone Deficits: 4-/5 hip flexion strength sitting RLE Sensation: WFL - Light Touch Left Lower Extremity Assessment LLE ROM/Strength/Tone: Deficits;WFL for tasks assessed LLE ROM/Strength/Tone Deficits: 4-/5 hip flexion strength sitting LLE Sensation: WFL - Light Touch LLE Coordination: WFL - gross motor   Balance Balance Balance Assessed: Yes Static Sitting Balance Static Sitting - Balance Support: Feet unsupported;No upper extremity supported Static Sitting - Level of Assistance: 5: Stand by assistance Static Sitting - Comment/# of Minutes: sitting EOB during MMT for 3 minutes High Level Balance High Level Balance Activites: Head turns (with RW. No LOB) High Level Balance Comments: Pt with posterior lean and deviation to the right without RW requiring min assist. With RW, pt requiring min guard with no LOB  End of Session PT - End of Session Equipment  Utilized During Treatment: Gait belt Patient left: in chair;with call bell/phone within reach;with family/visitor present Nurse Communication: Mobility status  GP     Payton Doughty 07/14/2012, 12:15 PM Payton Doughty, PT Student

## 2012-07-14 NOTE — Progress Notes (Signed)
Pt refused neuro checks at 12am. I asked her to tell me her name and asked where she was, and she threatened to push me. I then asked her to squeeze my fingers, in which she stated that I would not like the outcome if I did not move away from her. I then walked out of the room. Will continue to monitor her status.  [place], Chaunice Obie N, RN

## 2012-07-14 NOTE — Evaluation (Signed)
I have read and agree with the below assessment and plan.   Zethan Alfieri Helen Whitlow PT, DPT Pager: [phone] 

## 2012-07-14 NOTE — Consult Note (Signed)
Referring Physician: Dhungel    Chief Complaint: transient AMS   HPI:                                                                                                                                         Frances Mendoza is an 77 y.o. 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 Saturday patient was with husband all day, had gone to many appointments and just finished shopping at [place].  She laid down for a nap around 2 and when she woke up her husband noted she was having a hard time expressing herself (verbally she was making no sense) and was not understanding others.  On the way to the hospital she was showing very short term memory--and continued to ask the same question over and over again. Sunday she improved with interaction but continues to have memory issues about prior event.  She still cannot recall what happened on Saturday afternoon or why she was brought to hospital.   Date last known well: 5.24.14 Time last known well: 1600 tPA Given: No: out of window  Past Medical History  Diagnosis Date  . Hypertension   . Diverticulosis   . Hemorrhoids, internal   . Anxiety   . Arthritis     osteoarthritis  . Vertigo   . 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 Endocarditis 10/18/2010    treated with vancomycin, gentamicin, and Cipro  . Hypokalemia   . Insomnia   . Renal insufficiency 11/29/2010  . CAD (coronary artery disease)     a. admx with CP c/w [place] 4/14 => LHC: pLAD 40%, RCA with lum irregs => med Rx  . HLD (hyperlipidemia)     Past Surgical History  Procedure Laterality Date  . Cholecystectomy  09/29/1999  . Abdominal hysterectomy    . Bladder tuck    . Tee without cardioversion  10/10/2010     done with suggestion of small mitral vegetation.  ID was consulted for input and ID feels  patient with possible endocarditis with septic emboli    Family History   Problem Relation Age of Onset  . Coronary artery disease      family history of  . Colon cancer      family history of   Social History:  reports that she has never smoked. She has never used smokeless tobacco. She reports that she does not drink alcohol or use illicit drugs.  Allergies:  Allergies  Allergen Reactions  . Amoxicillin Anaphylaxis  . Penicillins Anaphylaxis  . Tolectin (Tolmetin Sodium) Anaphylaxis    Took at same time as amoxicillin when she had anaphylactic reaction.  . Ciprofloxacin Rash    Entire body looked as if sunburned, then skin peeled off.  . Prolactin   . Bactrim (Sulfamethoxazole W-Trimethoprim)  Rash    ? Early rash on palms  . Codeine Nausea And Vomiting  . Contrast Media (Iodinated Diagnostic Agents) Hives    Patient had severe hives after Myelogram.  No issues breathing.  Returned for  a nerve root injection, was given Benadryl, mentioned some minor itching to daughter on the way home but no further issues. Was given a Benadryl today for this injection.   . Procardia (Nifedipine) Other (See Comments)    Causes aches down legs    Medications:                                                                                                                           Prior to Admission:  Prescriptions prior to admission  Medication Sig Dispense Refill  . albuterol (PROVENTIL HFA;VENTOLIN HFA) 108 (90 BASE) MCG/ACT inhaler Inhale 2 puffs into the lungs every 6 (six) hours as needed for wheezing or shortness of breath.      . Ascorbic Acid (VITAMIN C PO) Take 2,000 mg by mouth daily.      Marland Kitchen aspirin 81 MG tablet Take 81 mg by mouth daily.        . diazepam (VALIUM) 5 MG tablet Take 5 mg by mouth every 8 (eight) hours as needed for anxiety.       . Docosahexaenoic Acid (DHA COMPLETE PO) Take 450 mg by mouth every other day.      . fluticasone (FLONASE) 50 MCG/ACT nasal spray Place 1 spray into the nose 2 (two) times daily.        . hydrochlorothiazide  (HYDRODIURIL) 25 MG tablet Take 1 tablet (25 mg total) by mouth daily.  30 tablet  6  . nitroGLYCERIN (NITROSTAT) 0.4 MG SL tablet Place 1 tablet (0.4 mg total) under the tongue every 5 (five) minutes x 3 doses as needed for chest pain.  25 tablet  11  . potassium chloride (K-DUR,KLOR-CON) 10 MEQ tablet Take 2 tablets (20 mEq total) by mouth daily.  30 tablet  6  . Selenium 200 MCG CAPS Take 1 capsule by mouth every other day.      . sertraline (ZOLOFT) 100 MG tablet Take 50 mg by mouth daily.       Marland Kitchen zinc gluconate 50 MG tablet Take 50 mg by mouth daily.       Scheduled: . [START ON 07/15/2012] clopidogrel  75 mg Oral Q breakfast  . enoxaparin (LOVENOX) injection  40 mg Subcutaneous Q24H  . fluticasone  1 spray Each Nare BID  . hydrochlorothiazide  25 mg Oral Daily  . potassium chloride  40 mEq Oral Daily  . sertraline  50 mg Oral Daily  . simvastatin  20 mg Oral q1800    ROS:  History obtained from the patient and daughter  General ROS: negative for - chills, fatigue, fever, night sweats, weight gain or weight loss Psychological ROS: negative for - behavioral disorder, hallucinations, memory difficulties, mood swings or suicidal ideation Ophthalmic ROS: negative for - blurry vision, double vision, eye pain or loss of vision ENT ROS: negative for - epistaxis, nasal discharge, oral lesions, sore throat, tinnitus or vertigo Allergy and Immunology ROS: negative for - hives or itchy/watery eyes Hematological and Lymphatic ROS: negative for - bleeding problems, bruising or swollen lymph nodes Endocrine ROS: negative for - galactorrhea, hair pattern changes, polydipsia/polyuria or temperature intolerance Respiratory ROS: negative for - cough, hemoptysis, shortness of breath or wheezing Cardiovascular ROS: negative for - chest pain, dyspnea on exertion, edema or  irregular heartbeat Gastrointestinal ROS: negative for - abdominal pain, diarrhea, hematemesis, nausea/vomiting or stool incontinence Genito-Urinary ROS: negative for - dysuria, hematuria, incontinence or urinary frequency/urgency Musculoskeletal ROS: negative for - joint swelling or muscular weakness Neurological ROS: as noted in HPI Dermatological ROS: negative for rash and skin lesion changes  Neurologic Examination:                                                                                                      Blood pressure 129/62, pulse 88, temperature 98.1 F (36.7 C), temperature source Oral, resp. rate 16, height 5\' 3"  (1.6 m), weight 79.334 kg (174 lb 14.4 oz), SpO2 94.00%.   Mental Status: Alert, oriented, thought content appropriate.  Speech fluent without evidence of aphasia.  Able to follow 3 step commands without difficulty. Cranial Nerves: II: Discs flat bilaterally; Visual fields grossly normal, pupils equal, round, reactive to light and accommodation III,IV, VI: ptosis not present, extra-ocular motions intact bilaterally V,VII: smile symmetric, facial light touch sensation normal bilaterally VIII: hearing normal bilaterally IX,X: gag reflex present XI: bilateral shoulder shrug XII: midline tongue extension Motor: Right : Upper extremity   5/5    Left:     Upper extremity   5/5  Lower extremity   5/5     Lower extremity   5/5 Tone and bulk:normal tone throughout; no atrophy noted Sensory: Pinprick and light touch intact throughout, bilaterally Deep Tendon Reflexes: 2+ and asymmetric with slightly more brisk on left AJ and KJ Plantars: Right: downgoing   Left: downgoing Cerebellar: normal finger-to-nose,  normal heel-to-shin test CV: pulses palpable throughout    Results for orders placed during the hospital encounter of 07/12/12 (from the past 48 hour(s))  GLUCOSE, CAPILLARY     Status: None   Collection Time    07/12/12  9:33 PM      Result Value Range    Glucose-Capillary 98  70 - 99 mg/dL  CBC     Status: Abnormal   Collection Time    07/12/12  9:35 PM      Result Value Range   WBC 11.3 (*) 4.0 - 10.5 K/uL   RBC 4.54  3.87 - 5.11 MIL/uL   Hemoglobin 14.1  12.0 - 15.0 g/dL   HCT 16.1  09.6 - 04.5 %   MCV  90.5  78.0 - 100.0 fL   MCH 31.1  26.0 - 34.0 pg   MCHC 34.3  30.0 - 36.0 g/dL   RDW 96.0  45.4 - 09.8 %   Platelets 251  150 - 400 K/uL  BASIC METABOLIC PANEL     Status: Abnormal   Collection Time    07/12/12  9:35 PM      Result Value Range   Sodium 138  135 - 145 mEq/L   Potassium 3.1 (*) 3.5 - 5.1 mEq/L   Chloride 98  96 - 112 mEq/L   CO2 27  19 - 32 mEq/L   Glucose, Bld 106 (*) 70 - 99 mg/dL   BUN 12  6 - 23 mg/dL   Creatinine, Ser 1.19  0.50 - 1.10 mg/dL   Calcium 14.7  8.4 - 82.9 mg/dL   GFR calc non Af Amer 84 (*) >90 mL/min   GFR calc Af Amer >90  >90 mL/min   Comment:            The eGFR has been calculated     using the CKD EPI equation.     This calculation has not been     validated in all clinical     situations.     eGFR's persistently     <90 mL/min signify     possible Chronic Kidney Disease.  HEPATIC FUNCTION PANEL     Status: None   Collection Time    07/12/12  9:35 PM      Result Value Range   Total Protein 7.6  6.0 - 8.3 g/dL   Albumin 4.5  3.5 - 5.2 g/dL   AST 19  0 - 37 U/L   ALT 12  0 - 35 U/L   Alkaline Phosphatase 74  39 - 117 U/L   Total Bilirubin 0.3  0.3 - 1.2 mg/dL   Bilirubin, Direct <5.6  0.0 - 0.3 mg/dL   Indirect Bilirubin NOT CALCULATED  0.3 - 0.9 mg/dL  URINALYSIS, ROUTINE W REFLEX MICROSCOPIC     Status: Abnormal   Collection Time    07/12/12 10:10 PM      Result Value Range   Color, Urine YELLOW  YELLOW   APPearance CLEAR  CLEAR   Specific Gravity, Urine 1.006  1.005 - 1.030   pH 7.5  5.0 - 8.0   Glucose, UA NEGATIVE  NEGATIVE mg/dL   Hgb urine dipstick NEGATIVE  NEGATIVE   Bilirubin Urine NEGATIVE  NEGATIVE   Ketones, ur NEGATIVE  NEGATIVE mg/dL   Protein, ur  NEGATIVE  NEGATIVE mg/dL   Urobilinogen, UA 0.2  0.0 - 1.0 mg/dL   Nitrite NEGATIVE  NEGATIVE   Leukocytes, UA MODERATE (*) NEGATIVE  GRAM STAIN     Status: None   Collection Time    07/12/12 10:10 PM      Result Value Range   Specimen Description URINE, RANDOM     Special Requests NONE     Gram Stain       Value: CYTOSPIN URINE     WBC PRESENT, PREDOMINANTLY MONONUCLEAR     NO ORGANISMS SEEN     SQUAMOUS EPITHELIAL CELLS PRESENT     NOTIFIED FORREST,Z RN 07/12/2012 [id number] JORDANS   Report Status 07/12/2012 FINAL    URINE MICROSCOPIC-ADD ON     Status: None   Collection Time    07/12/12 10:10 PM      Result Value Range   Squamous Epithelial / LPF RARE  RARE   WBC, UA 3-6  <3 WBC/hpf   RBC / HPF 0-2  <3 RBC/hpf   Bacteria, UA RARE  RARE  POCT I-STAT TROPONIN I     Status: None   Collection Time    07/12/12 10:11 PM      Result Value Range   Troponin i, poc 0.02  0.00 - 0.08 ng/mL   Comment 3            Comment: Due to the release kinetics of cTnI,     a negative result within the first hours     of the onset of symptoms does not rule out     myocardial infarction with certainty.     If myocardial infarction is still suspected,     repeat the test at appropriate intervals.  POCT I-STAT, CHEM 8     Status: Abnormal   Collection Time    07/12/12 10:13 PM      Result Value Range   Sodium 139  135 - 145 mEq/L   Potassium 3.0 (*) 3.5 - 5.1 mEq/L   Chloride 101  96 - 112 mEq/L   BUN 12  6 - 23 mg/dL   Creatinine, Ser 1.61  0.50 - 1.10 mg/dL   Glucose, Bld 096 (*) 70 - 99 mg/dL   Calcium, Ion 0.45  4.09 - 1.30 mmol/L   TCO2 29  0 - 100 mmol/L   Hemoglobin 14.3  12.0 - 15.0 g/dL   HCT 81.1  91.4 - 78.2 %  TROPONIN I     Status: None   Collection Time    07/12/12 11:57 PM      Result Value Range   Troponin I <0.30  <0.30 ng/mL   Comment:            Due to the release kinetics of cTnI,     a negative result within the first hours     of the onset of symptoms does not rule  out     myocardial infarction with certainty.     If myocardial infarction is still suspected,     repeat the test at appropriate intervals.  URINE RAPID DRUG SCREEN (HOSP PERFORMED)     Status: None   Collection Time    07/13/12 12:54 AM      Result Value Range   Opiates NONE DETECTED  NONE DETECTED   Cocaine NONE DETECTED  NONE DETECTED   Benzodiazepines NONE DETECTED  NONE DETECTED   Amphetamines NONE DETECTED  NONE DETECTED   Tetrahydrocannabinol NONE DETECTED  NONE DETECTED   Barbiturates NONE DETECTED  NONE DETECTED   Comment:            DRUG SCREEN FOR MEDICAL PURPOSES     ONLY.  IF CONFIRMATION IS NEEDED     FOR ANY PURPOSE, NOTIFY LAB     WITHIN 5 DAYS.                LOWEST DETECTABLE LIMITS     FOR URINE DRUG SCREEN     Drug Class       Cutoff (ng/mL)     Amphetamine      1000     Barbiturate      200     Benzodiazepine   200     Tricyclics       300     Opiates          300     Cocaine  300     THC              50  MAGNESIUM     Status: None   Collection Time    07/13/12  3:45 AM      Result Value Range   Magnesium 2.1  1.5 - 2.5 mg/dL  LIPID PANEL     Status: Abnormal   Collection Time    07/13/12  5:45 AM      Result Value Range   Cholesterol 229 (*) 0 - 200 mg/dL   Triglycerides 161 (*) <150 mg/dL   HDL 61  >09 mg/dL   Total CHOL/HDL Ratio 3.8     VLDL 38  0 - 40 mg/dL   LDL Cholesterol 604 (*) 0 - 99 mg/dL   Comment:            Total Cholesterol/HDL:CHD Risk     Coronary Heart Disease Risk Table                         Men   Women      1/2 Average Risk   3.4   3.3      Average Risk       5.0   4.4      2 X Average Risk   9.6   7.1      3 X Average Risk  23.4   11.0                Use the calculated Patient Ratio     above and the CHD Risk Table     to determine the patient's CHD Risk.                ATP III CLASSIFICATION (LDL):      <100     mg/dL   Optimal      540-981  mg/dL   Near or Above                        Optimal       130-159  mg/dL   Borderline      191-478  mg/dL   High      >295     mg/dL   Very High  TROPONIN I     Status: None   Collection Time    07/13/12  5:45 AM      Result Value Range   Troponin I <0.30  <0.30 ng/mL   Comment:            Due to the release kinetics of cTnI,     a negative result within the first hours     of the onset of symptoms does not rule out     myocardial infarction with certainty.     If myocardial infarction is still suspected,     repeat the test at appropriate intervals.  GLUCOSE, CAPILLARY     Status: Abnormal   Collection Time    07/13/12  6:51 AM      Result Value Range   Glucose-Capillary 101 (*) 70 - 99 mg/dL  TROPONIN I     Status: None   Collection Time    07/13/12 11:57 AM      Result Value Range   Troponin I <0.30  <0.30 ng/mL   Comment:            Due to the release kinetics of cTnI,  a negative result within the first hours     of the onset of symptoms does not rule out     myocardial infarction with certainty.     If myocardial infarction is still suspected,     repeat the test at appropriate intervals.  BASIC METABOLIC PANEL     Status: Abnormal   Collection Time    07/14/12  4:30 AM      Result Value Range   Sodium 138  135 - 145 mEq/L   Potassium 3.4 (*) 3.5 - 5.1 mEq/L   Chloride 98  96 - 112 mEq/L   CO2 31  19 - 32 mEq/L   Glucose, Bld 108 (*) 70 - 99 mg/dL   BUN 12  6 - 23 mg/dL   Creatinine, Ser 5.40  0.50 - 1.10 mg/dL   Calcium 9.7  8.4 - 98.1 mg/dL   GFR calc non Af Amer 83 (*) >90 mL/min   GFR calc Af Amer >90  >90 mL/min   Comment:            The eGFR has been calculated     using the CKD EPI equation.     This calculation has not been     validated in all clinical     situations.     eGFR's persistently     <90 mL/min signify     possible Chronic Kidney Disease.   Ct Head Wo Contrast  07/12/2012   *RADIOLOGY REPORT*  Clinical Data: Altered mental status, prior stroke  CT HEAD WITHOUT CONTRAST  Technique:   Contiguous axial images were obtained from the base of the skull through the vertex without contrast.  Comparison: 10/07/2010  Findings: Tiny left external capsule lacunar infarcts and right internal capsule anterior limb lacunar infarcts again noted. No acute hemorrhage, acute infarction, or mass lesion is seen.  Left maxillary sinusitis noted.  No skull fracture.  IMPRESSION: No acute intracranial finding.  Left maxillary sinusitis.   Original Report Authenticated By: Christiana Pellant, M.D.   Mri Brain Without Contrast  07/14/2012   *RADIOLOGY REPORT*  Clinical Data:  tia.  Confusion.  Headache.  Previous strokes.  MRI HEAD WITHOUT CONTRAST MRA HEAD WITHOUT CONTRAST  Technique:  Multiplanar, multiecho pulse sequences of the brain and surrounding structures were obtained without intravenous contrast. Angiographic images of the head were obtained using MRA technique without contrast.  Comparison:  Head CT 07/12/2012.  MRI 07/18/2011.  MRI HEAD  Findings:  Diffusion imaging shows a punctate acute infarction within the left caudate head and one or two punctate foci within the left frontal lobe.  No large vessel territory infarction.  There are chronic small vessel changes affecting the pons.  There are old small vessel infarctions within the cerebellum.  The cerebral hemispheres show moderate chronic small vessel changes within the deep and subcortical white matter.  No mass lesion, hemorrhage, hydrocephalus or extra-axial collection.  No pituitary mass.  There is left maxillary sinusitis.  IMPRESSION: Two or three punctate acute infarctions in the left frontal region affecting the caudate head and left frontal lobe.  No swelling, hemorrhage or mass effect.  Moderate chronic small vessel changes elsewhere throughout the brain.  MRA HEAD  Findings: Both internal carotid arteries are widely patent into the brain.  The anterior middle cerebral vessels are patent without proximal stenosis, aneurysm or vascular  malformation.  Both vertebral arteries are patent to the basilar.  No basilar stenosis.  Posterior circulation branch vessels are patent.  There is mild atherosclerotic irregularity of the distal branch vessels diffusely.  IMPRESSION: No major vessel occlusion or correctable proximal stenosis.  Distal vessel atherosclerotic irregularity.   Original Report Authenticated By: Paulina Fusi, M.D.   Dg Chest Port 1 View  07/12/2012   *RADIOLOGY REPORT*  Clinical Data: Altered mental status; chest pain.  PORTABLE CHEST - 1 VIEW  Comparison: Chest radiograph performed 05/23/2012  Findings: The lungs are well-aerated and clear.  There is no evidence of focal opacification, pleural effusion or pneumothorax.  The cardiomediastinal silhouette is normal in size; calcification is noted in the aortic arch.  No acute osseous abnormalities are seen.  IMPRESSION: No acute cardiopulmonary process seen.   Original Report Authenticated By: Tonia Ghent, M.D.   Mr Mra Head/brain Wo Cm  07/14/2012   *RADIOLOGY REPORT*  Clinical Data:  tia.  Confusion.  Headache.  Previous strokes.  MRI HEAD WITHOUT CONTRAST MRA HEAD WITHOUT CONTRAST  Technique:  Multiplanar, multiecho pulse sequences of the brain and surrounding structures were obtained without intravenous contrast. Angiographic images of the head were obtained using MRA technique without contrast.  Comparison:  Head CT 07/12/2012.  MRI 07/18/2011.  MRI HEAD  Findings:  Diffusion imaging shows a punctate acute infarction within the left caudate head and one or two punctate foci within the left frontal lobe.  No large vessel territory infarction.  There are chronic small vessel changes affecting the pons.  There are old small vessel infarctions within the cerebellum.  The cerebral hemispheres show moderate chronic small vessel changes within the deep and subcortical white matter.  No mass lesion, hemorrhage, hydrocephalus or extra-axial collection.  No pituitary mass.  There is left  maxillary sinusitis.  IMPRESSION: Two or three punctate acute infarctions in the left frontal region affecting the caudate head and left frontal lobe.  No swelling, hemorrhage or mass effect.  Moderate chronic small vessel changes elsewhere throughout the brain.  MRA HEAD  Findings: Both internal carotid arteries are widely patent into the brain.  The anterior middle cerebral vessels are patent without proximal stenosis, aneurysm or vascular malformation.  Both vertebral arteries are patent to the basilar.  No basilar stenosis.  Posterior circulation branch vessels are patent.  There is mild atherosclerotic irregularity of the distal branch vessels diffusely.  IMPRESSION: No major vessel occlusion or correctable proximal stenosis.  Distal vessel atherosclerotic irregularity.   Original Report Authenticated By: Paulina Fusi, M.D.    Assessment and plan discussed with with attending physician and they are in agreement.    Felicie Morn PA-C [facility] [phone]  07/14/2012, 12:55 PM   Assessment: 77 y.o. female presenting to [facility] [place] hospital after new onset of confusion and expressive difficulties. Patient is slowly returning to baseline but continues to have no memory of why she was brought to hospital. Exam is non-focal with exception of slightly more brisk reflexes in left KJ and AJ. MRI brain shows two or three punctate acute infarctions in the left frontal region affecting the caudate head and left frontal lobe which likely are not the etiology of patients confusion.  Given Hx of previous CVA and prolonged confusional state, cannot R/O seizure.   Stroke Risk Factors - hyperlipidemia and hypertension  Plan: 1. HgbA1c, 2. MRI, MRA  of the brain without contrast 3. PT consult, OT consult, Speech consult 4. Prophylactic therapy-Antiplatelet med: Plavix - dose 75 mg daily 5. Risk factor modification 6. Telemetry monitoring 7. Frequent neuro checks 8. EEG 9. Lower LDL  <70  Patient seen and examined together with physician assistant and I concur with the assessment and plan.  Dorian Pod, MD

## 2012-07-14 NOTE — Progress Notes (Signed)
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   Assessment/Plan:  AMS  Head CT negative.  2D echo normal. Carotid doppler negative.  MRI showed acute left frontal lobe infarct  Risk factor includes underlying CAD and HTN -neurology consulted. ASA switched to plavix. Added statin given elevated LDL . recommends EEG given AMS PT eval   Hypokalemia  repleted k   CAD  ASA switched to plavix given stroke. serial CE negative. Recent cath for unstable angina with 40% LAD stenosis. managed medically.  Started statin given hyperlipidemia  HTN  continue home meds   Spinal stenosis Severe spinal stenosis at L2-L4. Has low back and b/l leg pain. Patient refused for surgery. Followed with Dr Jeral Fruit as outpt   DVT prophylaxis   Code Status: full  Family Communication: husband and daughter at bedside   Disposition Plan:home once EEG completed  Consultants:  none Procedures:  none Antibiotics:  None   HPI/Subjective:  MRI shows acute left frontal infarct. Patient doesnot recall seeing me yesterday   Objective: Filed Vitals:   07/13/12 1800 07/13/12 2200 07/14/12 0200 07/14/12 1104  BP: 125/58 122/51 135/79 129/62  Pulse: 90 81 92 88  Temp: 98.9 F (37.2 C) 97.9 F (36.6 C) 97.3 F (36.3 C) 98.1 F (36.7 C)  TempSrc: Oral   Oral  Resp: 18 16 16 16   Height:      Weight:      SpO2: 98% 94% 97% 94%   No intake or output data in the 24 hours ending 07/14/12 1555 Filed Weights   07/13/12 0145  Weight: 79.334 kg (174 lb 14.4 oz)    Exam: General: Elderly female in NAD  HEENT: no pallor, moist mucosa  Chest : clear b/l, no added sounds  Cardiovascular: NS1&S2, no murmurs   Abdomen: soft, NT, ND, BS+  Musculoskeletal: Warm, no edema  CNS: AAOX3   Data Reviewed: Basic Metabolic Panel:  Recent Labs Lab 07/12/12 2135 07/12/12 2213 07/13/12 0345 07/14/12 0430  NA 138 139  --  138  K 3.1* 3.0*  --  3.4*  CL 98 101  --  98  CO2 27  --   --  31  GLUCOSE 106* 101*  --  108*  BUN 12 12  --  12  CREATININE 0.66 0.90  --  0.70  CALCIUM 10.5  --   --  9.7  MG  --   --  2.1  --    Liver Function Tests:  Recent Labs Lab 07/12/12 2135  AST 19  ALT 12  ALKPHOS 74  BILITOT 0.3  PROT 7.6  ALBUMIN 4.5   No results found for this basename: LIPASE, AMYLASE,  in the last 168 hours No results found for this basename: AMMONIA,  in the last 168 hours CBC:  Recent Labs Lab 07/12/12 2135 07/12/12 2213  WBC 11.3*  --   HGB 14.1 14.3  HCT 41.1 42.0  MCV 90.5  --   PLT 251  --    Cardiac Enzymes:  Recent Labs Lab 07/12/12 2357 07/13/12 0545 07/13/12 1157  TROPONINI <0.30 <0.30 <0.30   BNP (last 3 results) No results found for this basename: PROBNP,  in the last 8760 hours CBG:  Recent Labs Lab  07/12/12 2133 07/13/12 0651  GLUCAP 98 101*    Recent Results (from the past 240 hour(s))  GRAM STAIN     Status: None   Collection Time    07/12/12 10:10 PM      Result Value Range Status   Specimen Description URINE, RANDOM   Final   Special Requests NONE   Final   Gram Stain     Final   Value: CYTOSPIN URINE     WBC PRESENT, PREDOMINANTLY MONONUCLEAR     NO ORGANISMS SEEN     SQUAMOUS EPITHELIAL CELLS PRESENT     NOTIFIED FORREST,Z RN 07/12/2012 2303 JORDANS   Report Status 07/12/2012 FINAL   Final     Studies: Ct Head Wo Contrast  07/12/2012   *RADIOLOGY REPORT*  Clinical Data: Altered mental status, prior stroke  CT HEAD WITHOUT CONTRAST  Technique:  Contiguous axial images were obtained from the base of the skull through the vertex without contrast.  Comparison: 10/07/2010  Findings: Tiny left external capsule lacunar infarcts and  right internal capsule anterior limb lacunar infarcts again noted. No acute hemorrhage, acute infarction, or mass lesion is seen.  Left maxillary sinusitis noted.  No skull fracture.  IMPRESSION: No acute intracranial finding.  Left maxillary sinusitis.   Original Report Authenticated By: Christiana Pellant, M.D.   Mri Brain Without Contrast  07/14/2012   *RADIOLOGY REPORT*  Clinical Data:  tia.  Confusion.  Headache.  Previous strokes.  MRI HEAD WITHOUT CONTRAST MRA HEAD WITHOUT CONTRAST  Technique:  Multiplanar, multiecho pulse sequences of the brain and surrounding structures were obtained without intravenous contrast. Angiographic images of the head were obtained using MRA technique without contrast.  Comparison:  Head CT 07/12/2012.  MRI 07/18/2011.  MRI HEAD  Findings:  Diffusion imaging shows a punctate acute infarction within the left caudate head and one or two punctate foci within the left frontal lobe.  No large vessel territory infarction.  There are chronic small vessel changes affecting the pons.  There are old small vessel infarctions within the cerebellum.  The cerebral hemispheres show moderate chronic small vessel changes within the deep and subcortical white matter.  No mass lesion, hemorrhage, hydrocephalus or extra-axial collection.  No pituitary mass.  There is left maxillary sinusitis.  IMPRESSION: Two or three punctate acute infarctions in the left frontal region affecting the caudate head and left frontal lobe.  No swelling, hemorrhage or mass effect.  Moderate chronic small vessel changes elsewhere throughout the brain.  MRA HEAD  Findings: Both internal carotid arteries are widely patent into the brain.  The anterior middle cerebral vessels are patent without proximal stenosis, aneurysm or vascular malformation.  Both vertebral arteries are patent to the basilar.  No basilar stenosis.  Posterior circulation branch vessels are patent.  There is mild atherosclerotic irregularity of the distal  branch vessels diffusely.  IMPRESSION: No major vessel occlusion or correctable proximal stenosis.  Distal vessel atherosclerotic irregularity.   Original Report Authenticated By: Paulina Fusi, M.D.   Dg Chest Port 1 View  07/12/2012   *RADIOLOGY REPORT*  Clinical Data: Altered mental status; chest pain.  PORTABLE CHEST - 1 VIEW  Comparison: Chest radiograph performed 05/23/2012  Findings: The lungs are well-aerated and clear.  There is no evidence of focal opacification, pleural effusion or pneumothorax.  The cardiomediastinal silhouette is normal in size; calcification is noted in the aortic arch.  No acute osseous abnormalities are seen.  IMPRESSION: No acute cardiopulmonary process seen.   Original Report  Authenticated By: Tonia Ghent, M.D.   Mr Mra Head/brain Wo Cm  07/14/2012   *RADIOLOGY REPORT*  Clinical Data:  tia.  Confusion.  Headache.  Previous strokes.  MRI HEAD WITHOUT CONTRAST MRA HEAD WITHOUT CONTRAST  Technique:  Multiplanar, multiecho pulse sequences of the brain and surrounding structures were obtained without intravenous contrast. Angiographic images of the head were obtained using MRA technique without contrast.  Comparison:  Head CT 07/12/2012.  MRI 07/18/2011.  MRI HEAD  Findings:  Diffusion imaging shows a punctate acute infarction within the left caudate head and one or two punctate foci within the left frontal lobe.  No large vessel territory infarction.  There are chronic small vessel changes affecting the pons.  There are old small vessel infarctions within the cerebellum.  The cerebral hemispheres show moderate chronic small vessel changes within the deep and subcortical white matter.  No mass lesion, hemorrhage, hydrocephalus or extra-axial collection.  No pituitary mass.  There is left maxillary sinusitis.  IMPRESSION: Two or three punctate acute infarctions in the left frontal region affecting the caudate head and left frontal lobe.  No swelling, hemorrhage or mass effect.   Moderate chronic small vessel changes elsewhere throughout the brain.  MRA HEAD  Findings: Both internal carotid arteries are widely patent into the brain.  The anterior middle cerebral vessels are patent without proximal stenosis, aneurysm or vascular malformation.  Both vertebral arteries are patent to the basilar.  No basilar stenosis.  Posterior circulation branch vessels are patent.  There is mild atherosclerotic irregularity of the distal branch vessels diffusely.  IMPRESSION: No major vessel occlusion or correctable proximal stenosis.  Distal vessel atherosclerotic irregularity.   Original Report Authenticated By: Paulina Fusi, M.D.    Scheduled Meds: . [START ON 07/15/2012] clopidogrel  75 mg Oral Q breakfast  . enoxaparin (LOVENOX) injection  40 mg Subcutaneous Q24H  . fluticasone  1 spray Each Nare BID  . hydrochlorothiazide  25 mg Oral Daily  . potassium chloride  40 mEq Oral Daily  . sertraline  50 mg Oral Daily  . simvastatin  20 mg Oral q1800   Continuous Infusions:      Time spent: 25 minutes    Patrisia Faeth  [facility] Pager [phone] If 7PM-7AM, please contact night-coverage at [email or web address], password [facility] 07/14/2012, 3:55 PM  LOS: 2 days

## 2012-07-14 NOTE — Progress Notes (Addendum)
Patient is noncompliant with safety measures. She refuses to be in the bed because of the alarm. Patient refuses to call for help when needing to get up and insists that her family helps her or that she does it herself. Safety measures and goals have been given to patient, although she chooses not to comply with them. Once we have chair alarm pads, I will try using one for her safety. Until then, I will continue to monitor patient and stress the importance of compliance with safety measures set forth for her.

## 2012-07-15 ENCOUNTER — Inpatient Hospital Stay (HOSPITAL_COMMUNITY): Payer: Medicare Other

## 2012-07-15 DIAGNOSIS — E785 Hyperlipidemia, unspecified: Secondary | ICD-10-CM | POA: Diagnosis present

## 2012-07-15 MED ORDER — CLOPIDOGREL BISULFATE 75 MG PO TABS: 75.0000 mg | ORAL_TABLET | Freq: Every day | ORAL | Status: DC

## 2012-07-15 MED ORDER — SIMVASTATIN 20 MG PO TABS: 20.0000 mg | ORAL_TABLET | Freq: Every day | ORAL | Status: DC

## 2012-07-15 NOTE — Progress Notes (Signed)
Physical Therapy Treatment Patient Details Name: Frances Mendoza MRN: [id number] DOB: 1936-02-18 Today's Date: 07/15/2012 Time: 7829-5621 PT Time Calculation (min): 24 min  PT Assessment / Plan / Recommendation Comments on Treatment Session  Patient progressing very well. Anticipate DC today. Family states patient is back to baseline with activity. Added stairs as patient has 3 to enter her house. Able to complete stair training with no issues    Follow Up Recommendations  Home health PT;Supervision/Assistance - 24 hour     Does the patient have the potential to tolerate intense rehabilitation     Barriers to Discharge        Equipment Recommendations       Recommendations for Other Services    Frequency Min 4X/week   Plan Discharge plan remains appropriate;Frequency remains appropriate    Precautions / Restrictions Precautions Precautions: Fall   Pertinent Vitals/Pain Denied pain     Mobility  Bed Mobility Supine to Sit: 6: Modified independent (Device/Increase time) Transfers Sit to Stand: 6: Modified independent (Device/Increase time) Stand to Sit: 6: Modified independent (Device/Increase time) Ambulation/Gait Ambulation/Gait Assistance: 5: Supervision Ambulation Distance (Feet): 400 Feet Assistive device: None Ambulation/Gait Assistance Details: Patient swaying some with ambulation however family reassures that patient will not use her RW.  Gait Pattern: Step-through pattern Stairs: Yes Stairs Assistance: 5: Supervision Stair Management Technique: Two rails;Alternating pattern;Forwards Number of Stairs: 5    Exercises     PT Diagnosis:    PT Problem List:   PT Treatment Interventions:     PT Goals Acute Rehab PT Goals PT Goal: Sit to Stand - Progress: Met PT Goal: Stand to Sit - Progress: Met PT Goal: Ambulate - Progress: Progressing toward goal Pt will Go Up / Down Stairs: 3-5 stairs;with modified independence;with least restrictive assistive device PT  Goal: Up/Down Stairs - Progress: Goal set today  Visit Information  Last PT Received On: 07/15/12 Assistance Needed: +1    Subjective Data      Cognition  Cognition Arousal/Alertness: Awake/alert Behavior During Therapy: WFL for tasks assessed/performed Overall Cognitive Status: Within Functional Limits for tasks assessed    Balance  Dynamic Standing Balance Dynamic Standing - Balance Support: During functional activity;No upper extremity supported Dynamic Standing - Level of Assistance: 5: Stand by assistance Dynamic Standing - Balance Activities: Reaching for objects;Reaching across midline  End of Session PT - End of Session Equipment Utilized During Treatment: Gait belt Activity Tolerance: Patient tolerated treatment well Patient left: in chair;with call bell/phone within reach;with family/visitor present Nurse Communication: Mobility status   GP     Fredrich Birks 07/15/2012, 11:58 AM  07/15/2012 Fredrich Birks PTA [phone] pager [phone] office

## 2012-07-15 NOTE — Discharge Summary (Signed)
Physician Discharge Summary  AUDYN DIMERCURIO ZOX:[id number] DOB: 03-14-1935 DOA: 07/12/2012  PCP: Carmin Richmond, MD  Admit date: 07/12/2012 Discharge date: 07/15/2012  Time spent:  minutes  Recommendations for Outpatient Follow-up:  1. D/C home with HHPT 2. Follow up with PCP in 1 week. Needs EEG result reviewed 3. [facility] will call patient for appointment in 2 weeks No driving, operating heavy machinery, perform activities at heights, swimming or participation in water activities until release by outpatient neurology or PCP   Discharge Diagnoses:    Principal Problem:   CVA (cerebral infarction)  Active Problems:   Coronary artery disease   Acute encephalopathy   Hypokalemia   Hypertension   Other and unspecified hyperlipidemia   Discharge Condition: fair  Diet recommendation: cardiac  Filed Weights   07/13/12 0145  Weight: 79.334 kg (174 lb 14.4 oz)    History of present illness:  Please refer to admission H&P for details, but in brief, 77 -year-old female with history of hypertension, 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 MRI brain done showed acute left frontal lobe infarct.    Hospital Course:   Altered mental status  Head CT negative. 2D echo normal. Carotid doppler negative.  MRI showed acute left frontal lobe infarct . Given symptoms of altered mental status and presentation seizure was considered high in differential.EEG will be done prior to discharge and result needs to be followed as outpatient. Risk factor includes underlying CAD and HTN  -neurology consulted. ASA switched to plavix. Added statin given elevated LDL . PT eval recommends HHPT. -symptoms resolved.  Hypokalemia  repleted k   CAD  ASA switched to plavix given stroke. serial CE negative. Recent cath for unstable angina with 40% LAD stenosis. managed medically.   Started statin given hyperlipidemia   HTN  continue home meds   Spinal stenosis  Severe spinal stenosis at L2-L4. Has low back and b/l leg pain. Patient refused for surgery. Followed with Dr Jeral Fruit as outpt .     Procedures: EEG Consultations:  neurology  Discharge Exam: Filed Vitals:   07/14/12 2009 07/14/12 2132 07/15/12 0129 07/15/12 0600  BP: 155/81 134/60 117/56 136/84  Pulse: 89 88 87 87  Temp: 97.8 F (36.6 C) 98 F (36.7 C) 98.1 F (36.7 C) 98 F (36.7 C)  TempSrc: Oral Oral Oral Oral  Resp: 16 16 18 18   Height:      Weight:      SpO2: 95% 95% 96% 95%    General: Elderly female in NAD  HEENT: no pallor, moist mucosa  Chest : clear b/l, no added sounds  Cardiovascular: NS1&S2, no murmurs  Abdomen: soft, NT, ND, BS+  Musculoskeletal: Warm, no edema  CNS: AAOX3   Discharge Instructions   Future Appointments Provider Department Dept Phone   07/15/2012 2:00 PM [facility] EEG [phone]       Medication List    STOP taking these medications       aspirin 81 MG tablet      TAKE these medications       albuterol 108 (90 BASE) MCG/ACT inhaler  Commonly known as:  PROVENTIL HFA;VENTOLIN HFA  Inhale 2 puffs into the lungs every 6 (six) hours as needed for wheezing or shortness of breath.     clopidogrel 75 MG tablet  Commonly known as:  PLAVIX  Take 1 tablet (75 mg  total) by mouth daily with breakfast.     DHA COMPLETE PO  Take 450 mg by mouth every other day.     diazepam 5 MG tablet  Commonly known as:  VALIUM  Take 5 mg by mouth every 8 (eight) hours as needed for anxiety.     fluticasone 50 MCG/ACT nasal spray  Commonly known as:  FLONASE  Place 1 spray into the nose 2 (two) times daily.     hydrochlorothiazide 25 MG tablet  Commonly known as:  HYDRODIURIL  Take 1 tablet (25 mg total) by mouth daily.     nitroGLYCERIN 0.4 MG SL tablet  Commonly known as:  NITROSTAT  Place 1 tablet (0.4 mg total)  under the tongue every 5 (five) minutes x 3 doses as needed for chest pain.     potassium chloride 10 MEQ tablet  Commonly known as:  K-DUR,KLOR-CON  Take 2 tablets (20 mEq total) by mouth daily.     Selenium 200 MCG Caps  Take 1 capsule by mouth every other day.     sertraline 100 MG tablet  Commonly known as:  ZOLOFT  Take 50 mg by mouth daily.     simvastatin 20 MG tablet  Commonly known as:  ZOCOR  Take 1 tablet (20 mg total) by mouth daily at 6 PM.     VITAMIN C PO  Take 2,000 mg by mouth daily.     zinc gluconate 50 MG tablet  Take 50 mg by mouth daily.       Allergies  Allergen Reactions  . Amoxicillin Anaphylaxis  . Penicillins Anaphylaxis  . Tolectin (Tolmetin Sodium) Anaphylaxis    Took at same time as amoxicillin when she had anaphylactic reaction.  . Ciprofloxacin Rash    Entire body looked as if sunburned, then skin peeled off.  . Prolactin   . Bactrim (Sulfamethoxazole W-Trimethoprim) Rash    ? Early rash on palms  . Codeine Nausea And Vomiting  . Contrast Media (Iodinated Diagnostic Agents) Hives    Patient had severe hives after Myelogram.  No issues breathing.  Returned for  a nerve root injection, was given Benadryl, mentioned some minor itching to daughter on the way home but no further issues. Was given a Benadryl today for this injection.   . Procardia (Nifedipine) Other (See Comments)    Causes aches down legs       Follow-up Information   Follow up with DAVIS,JAMES W, MD In 1 week. (follow EEG result)    Contact information:   [address] [place] [address] [phone]       Follow up with [facility] In 2 weeks. (patient will be called for appointment)    Contact information:   [address] [place] [address] [phone]       The results of significant diagnostics from this hospitalization (including imaging, microbiology, ancillary and laboratory) are listed below for reference.     Significant Diagnostic Studies: Ct Head Wo Contrast  07/12/2012   *RADIOLOGY REPORT*  Clinical Data: Altered mental status, prior stroke  CT HEAD WITHOUT CONTRAST  Technique:  Contiguous axial images were obtained from the base of the skull through the vertex without contrast.  Comparison: 10/07/2010  Findings: Tiny left external capsule lacunar infarcts and right internal capsule anterior limb lacunar infarcts again noted. No acute hemorrhage, acute infarction, or mass lesion is seen.  Left maxillary sinusitis noted.  No skull fracture.  IMPRESSION: No acute intracranial  finding.  Left maxillary sinusitis.   Original Report Authenticated By: Christiana Pellant, M.D.   Mri Brain Without Contrast  07/14/2012   *RADIOLOGY REPORT*  Clinical Data:  tia.  Confusion.  Headache.  Previous strokes.  MRI HEAD WITHOUT CONTRAST MRA HEAD WITHOUT CONTRAST  Technique:  Multiplanar, multiecho pulse sequences of the brain and surrounding structures were obtained without intravenous contrast. Angiographic images of the head were obtained using MRA technique without contrast.  Comparison:  Head CT 07/12/2012.  MRI 07/18/2011.  MRI HEAD  Findings:  Diffusion imaging shows a punctate acute infarction within the left caudate head and one or two punctate foci within the left frontal lobe.  No large vessel territory infarction.  There are chronic small vessel changes affecting the pons.  There are old small vessel infarctions within the cerebellum.  The cerebral hemispheres show moderate chronic small vessel changes within the deep and subcortical white matter.  No mass lesion, hemorrhage, hydrocephalus or extra-axial collection.  No pituitary mass.  There is left maxillary sinusitis.  IMPRESSION: Two or three punctate acute infarctions in the left frontal region affecting the caudate head and left frontal lobe.  No swelling, hemorrhage or mass effect.  Moderate chronic small vessel changes elsewhere throughout the brain.  MRA HEAD   Findings: Both internal carotid arteries are widely patent into the brain.  The anterior middle cerebral vessels are patent without proximal stenosis, aneurysm or vascular malformation.  Both vertebral arteries are patent to the basilar.  No basilar stenosis.  Posterior circulation branch vessels are patent.  There is mild atherosclerotic irregularity of the distal branch vessels diffusely.  IMPRESSION: No major vessel occlusion or correctable proximal stenosis.  Distal vessel atherosclerotic irregularity.   Original Report Authenticated By: Paulina Fusi, M.D.   Dg Chest Port 1 View  07/12/2012   *RADIOLOGY REPORT*  Clinical Data: Altered mental status; chest pain.  PORTABLE CHEST - 1 VIEW  Comparison: Chest radiograph performed 05/23/2012  Findings: The lungs are well-aerated and clear.  There is no evidence of focal opacification, pleural effusion or pneumothorax.  The cardiomediastinal silhouette is normal in size; calcification is noted in the aortic arch.  No acute osseous abnormalities are seen.  IMPRESSION: No acute cardiopulmonary process seen.   Original Report Authenticated By: Tonia Ghent, M.D.   Mr Mra Head/brain Wo Cm  07/14/2012   *RADIOLOGY REPORT*  Clinical Data:  tia.  Confusion.  Headache.  Previous strokes.  MRI HEAD WITHOUT CONTRAST MRA HEAD WITHOUT CONTRAST  Technique:  Multiplanar, multiecho pulse sequences of the brain and surrounding structures were obtained without intravenous contrast. Angiographic images of the head were obtained using MRA technique without contrast.  Comparison:  Head CT 07/12/2012.  MRI 07/18/2011.  MRI HEAD  Findings:  Diffusion imaging shows a punctate acute infarction within the left caudate head and one or two punctate foci within the left frontal lobe.  No large vessel territory infarction.  There are chronic small vessel changes affecting the pons.  There are old small vessel infarctions within the cerebellum.  The cerebral hemispheres show moderate chronic  small vessel changes within the deep and subcortical white matter.  No mass lesion, hemorrhage, hydrocephalus or extra-axial collection.  No pituitary mass.  There is left maxillary sinusitis.  IMPRESSION: Two or three punctate acute infarctions in the left frontal region affecting the caudate head and left frontal lobe.  No swelling, hemorrhage or mass effect.  Moderate chronic small vessel changes elsewhere throughout the brain.  MRA HEAD  Findings: Both internal carotid arteries are widely patent into the brain.  The anterior middle cerebral vessels are patent without proximal stenosis, aneurysm or vascular malformation.  Both vertebral arteries are patent to the basilar.  No basilar stenosis.  Posterior circulation branch vessels are patent.  There is mild atherosclerotic irregularity of the distal branch vessels diffusely.  IMPRESSION: No major vessel occlusion or correctable proximal stenosis.  Distal vessel atherosclerotic irregularity.   Original Report Authenticated By: Paulina Fusi, M.D.    Microbiology: Recent Results (from the past 240 hour(s))  GRAM STAIN     Status: None   Collection Time    07/12/12 10:10 PM      Result Value Range Status   Specimen Description URINE, RANDOM   Final   Special Requests NONE   Final   Gram Stain     Final   Value: CYTOSPIN URINE     WBC PRESENT, PREDOMINANTLY MONONUCLEAR     NO ORGANISMS SEEN     SQUAMOUS EPITHELIAL CELLS PRESENT     NOTIFIED FORREST,Z RN 07/12/2012 2303 JORDANS   Report Status 07/12/2012 FINAL   Final     Labs: Basic Metabolic Panel:  Recent Labs Lab 07/12/12 2135 07/12/12 2213 07/13/12 0345 07/14/12 0430  NA 138 139  --  138  K 3.1* 3.0*  --  3.4*  CL 98 101  --  98  CO2 27  --   --  31  GLUCOSE 106* 101*  --  108*  BUN 12 12  --  12  CREATININE 0.66 0.90  --  0.70  CALCIUM 10.5  --   --  9.7  MG  --   --  2.1  --    Liver Function Tests:  Recent Labs Lab 07/12/12 2135  AST 19  ALT 12  ALKPHOS 74  BILITOT  0.3  PROT 7.6  ALBUMIN 4.5   No results found for this basename: LIPASE, AMYLASE,  in the last 168 hours No results found for this basename: AMMONIA,  in the last 168 hours CBC:  Recent Labs Lab 07/12/12 2135 07/12/12 2213  WBC 11.3*  --   HGB 14.1 14.3  HCT 41.1 42.0  MCV 90.5  --   PLT 251  --    Cardiac Enzymes:  Recent Labs Lab 07/12/12 2357 07/13/12 0545 07/13/12 1157  TROPONINI <0.30 <0.30 <0.30   BNP: BNP (last 3 results) No results found for this basename: PROBNP,  in the last 8760 hours CBG:  Recent Labs Lab 07/12/12 2133 07/13/12 0651  GLUCAP 98 101*       Signed:  Sumayyah Custodio  [facility] 07/15/2012, 10:12 AM

## 2012-07-15 NOTE — Progress Notes (Signed)
EEG completed.

## 2012-07-15 NOTE — Progress Notes (Signed)
Patient d/c instructions and stroke education given to patient and family. All questions answered to satisfaction. Pt D/C home in no signs of acute distress.

## 2012-07-15 NOTE — Procedures (Signed)
EEG report.  Brief clinical history: 77 years old female with history of septic brain embolism in the context of endocarditis, admitted with language dysfunction and confusion. No prior history of frank epileptic seizures.  Technique: this is a 17 channel routine scalp EEG performed at the bedside with bipolar and monopolar montages arranged in accordance to the international 10/20 system of electrode placement. One channel was dedicated to EKG recording.  The study was performed during wakefulness and drowsiness. No activating procedures employed during the test..  Description:In the wakeful state, the best background consisted of a medium amplitude, posterior dominant, well sustained, symmetric and reactive 10 Hz rhythm. Drowsiness demonstrated dropout of the alpha rhythm  No focal or generalized epileptiform discharges noted.  No slowing seen.  EKG showed sinus rhythm.  Impression: this is a normal awake and drowsy EEG. Please, be aware that a normal EEG does not exclude the possibility of epilepsy.  Clinical correlation is advised.  Wyatt Portela, MD

## 2012-07-15 NOTE — Progress Notes (Signed)
NEURO HOSPITALIST PROGRESS NOTE   SUBJECTIVE:                                                                                                                        Patient is back to her baseline per family.  She recalls seeing me yesterday but cannot recall our whole conversation.  Per son this is normal for her.   OBJECTIVE:                                                                                                                           Vital signs in last 24 hours: Temp:  [97.8 F (36.6 C)-98.1 F (36.7 C)] 98 F (36.7 C) (05/27 0600) Pulse Rate:  [87-89] 87 (05/27 0600) Resp:  [16-18] 18 (05/27 0600) BP: (117-155)/(56-84) 136/84 mmHg (05/27 0600) SpO2:  [94 %-96 %] 95 % (05/27 0600)  Intake/Output from previous day:   Intake/Output this shift:   Nutritional status: Cardiac  Past Medical History  Diagnosis Date  . Hypertension   . Diverticulosis   . Hemorrhoids, internal   . Anxiety   . Arthritis     osteoarthritis  . Vertigo   . 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 Endocarditis 10/18/2010    treated with vancomycin, gentamicin, and Cipro  . Hypokalemia   . Insomnia   . Renal insufficiency 11/29/2010  . CAD (coronary artery disease)     a. admx with CP c/w [place] 4/14 => LHC: pLAD 40%, RCA with lum irregs => med Rx  . HLD (hyperlipidemia)      Neurologic Exam:  Mental Status:  Alert, oriented, thought content appropriate. Speech fluent without evidence of aphasia. Able to follow 3 step commands without difficulty.  Cranial Nerves:  II: Discs flat bilaterally; Visual fields grossly normal, pupils equal, round, reactive to light and accommodation  III,IV, VI: ptosis not present, extra-ocular motions intact bilaterally  V,VII: smile symmetric, facial light touch sensation normal bilaterally  VIII: hearing normal bilaterally  IX,X: gag  reflex present  XI: bilateral shoulder shrug  XII: midline tongue extension  Motor:  Right :   Upper extremity 5/5  Left:  Upper extremity 5/5    Lower extremity 5/5   Lower extremity 5/5  Tone and bulk:normal tone throughout; no atrophy noted  Sensory: Pinprick and light touch intact throughout, bilaterally  Deep Tendon Reflexes: 2+ and asymmetric with slightly more brisk on left AJ and KJ  Plantars:  Right: downgoing   Left: downgoing  Cerebellar:  normal finger-to-nose, normal heel-to-shin test  CV: pulses palpable throughout    Lab Results: Lab Results  Component Value Date/Time   CHOL 229* 07/13/2012  5:45 AM   Lipid Panel  Recent Labs  07/13/12 0545  CHOL 229*  TRIG 192*  HDL 61  CHOLHDL 3.8  VLDL 38  LDLCALC 161*    Studies/Results: Mri Brain Without Contrast  07/14/2012   *RADIOLOGY REPORT*  Clinical Data:  tia.  Confusion.  Headache.  Previous strokes.  MRI HEAD WITHOUT CONTRAST MRA HEAD WITHOUT CONTRAST  Technique:  Multiplanar, multiecho pulse sequences of the brain and surrounding structures were obtained without intravenous contrast. Angiographic images of the head were obtained using MRA technique without contrast.  Comparison:  Head CT 07/12/2012.  MRI 07/18/2011.  MRI HEAD  Findings:  Diffusion imaging shows a punctate acute infarction within the left caudate head and one or two punctate foci within the left frontal lobe.  No large vessel territory infarction.  There are chronic small vessel changes affecting the pons.  There are old small vessel infarctions within the cerebellum.  The cerebral hemispheres show moderate chronic small vessel changes within the deep and subcortical white matter.  No mass lesion, hemorrhage, hydrocephalus or extra-axial collection.  No pituitary mass.  There is left maxillary sinusitis.  IMPRESSION: Two or three punctate acute infarctions in the left frontal region affecting the caudate head and left frontal lobe.  No swelling,  hemorrhage or mass effect.  Moderate chronic small vessel changes elsewhere throughout the brain.  MRA HEAD  Findings: Both internal carotid arteries are widely patent into the brain.  The anterior middle cerebral vessels are patent without proximal stenosis, aneurysm or vascular malformation.  Both vertebral arteries are patent to the basilar.  No basilar stenosis.  Posterior circulation branch vessels are patent.  There is mild atherosclerotic irregularity of the distal branch vessels diffusely.  IMPRESSION: No major vessel occlusion or correctable proximal stenosis.  Distal vessel atherosclerotic irregularity.   Original Report Authenticated By: Paulina Fusi, M.D.   Mr Mra Head/brain Wo Cm  07/14/2012   *RADIOLOGY REPORT*  Clinical Data:  tia.  Confusion.  Headache.  Previous strokes.  MRI HEAD WITHOUT CONTRAST MRA HEAD WITHOUT CONTRAST  Technique:  Multiplanar, multiecho pulse sequences of the brain and surrounding structures were obtained without intravenous contrast. Angiographic images of the head were obtained using MRA technique without contrast.  Comparison:  Head CT 07/12/2012.  MRI 07/18/2011.  MRI HEAD  Findings:  Diffusion imaging shows a punctate acute infarction within the left caudate head and one or two punctate foci within the left frontal lobe.  No large vessel territory infarction.  There are chronic small vessel changes affecting the pons.  There are old small vessel infarctions within the cerebellum.  The cerebral hemispheres show moderate chronic small vessel changes within the deep and subcortical white matter.  No mass lesion, hemorrhage, hydrocephalus or extra-axial collection.  No pituitary mass.  There is left maxillary sinusitis.  IMPRESSION: Two or three punctate acute infarctions in the left frontal region affecting the caudate head and left frontal lobe.  No swelling, hemorrhage or mass effect.  Moderate chronic small vessel changes elsewhere throughout the brain.  MRA HEAD   Findings: Both internal carotid arteries are widely patent into the brain.  The anterior middle cerebral vessels are patent without proximal stenosis, aneurysm or vascular malformation.  Both vertebral arteries are patent to the basilar.  No basilar stenosis.  Posterior circulation branch vessels are patent.  There is mild atherosclerotic irregularity of the distal branch vessels diffusely.  IMPRESSION: No major vessel occlusion or correctable proximal stenosis.  Distal vessel atherosclerotic irregularity.   Original Report Authenticated By: Paulina Fusi, M.D.    MEDICATIONS                                                                                                                        Scheduled: . clopidogrel  75 mg Oral Q breakfast  . enoxaparin (LOVENOX) injection  40 mg Subcutaneous Q24H  . fluticasone  1 spray Each Nare BID  . hydrochlorothiazide  25 mg Oral Daily  . potassium chloride  40 mEq Oral Daily  . sertraline  50 mg Oral Daily  . simvastatin  20 mg Oral q1800    ASSESSMENT/PLAN:                                                                                                            77 y.o. female presenting to [facility] hospital after new onset of confusion and expressive difficulties.  MRI brain shows two or three punctate acute infarctions in the left frontal region affecting the caudate head and left frontal lobe which likely are not the etiology of patients confusion. Given Hx of previous CVA and prolonged confusional state, cannot R/O seizure. EEG may be done as out patient or done while in house and followed up as out patient.  Would not initiate AED at this time.  I have had a long discussion with family about No driving, operating heavy machinery, perform activities at heights, swimming or participation in water activities until release by outpatient physician.  Patient has stated that she is still going to drive.   Recommend: 1) EEG --may be done as out patient 2) No  driving, operating heavy machinery, perform activities at heights, swimming or participation in water activities until release by outpatient physician.   3) F/U [facility] [address] [place], [place] [address]--[phone] or  [facility] neurology [address] [place], [place] [address] [phone]     Neurology S/O    Assessment and plan discussed with with attending physician and they are in agreement.  Felicie Morn PA-C [facility] [phone]  07/15/2012, 8:56 AM

## 2012-07-15 NOTE — Progress Notes (Signed)
Despite reinforcement of fall prevention safety plan, pt continues to get out of bed without calling for assistance.  Both pt and family were reminded of the plan, and although family members state their understanding, pt refuses to comply.

## 2012-07-15 NOTE — Care Management Note (Signed)
    Page 1 of 1   07/15/2012     12:19:03 PM   CARE MANAGEMENT NOTE 07/15/2012  Patient:  Frances Mendoza, Frances Mendoza   Account Number:  [id number]  Date Initiated:  07/15/2012  Documentation initiated by:  [facility]  Subjective/Objective Assessment:   admitted with CVA  lives with spouse     Action/Plan:   PT/OT evals- recommended HHPT   Anticipated DC Date:  07/15/2012   Anticipated DC Plan:  HOME W HOME HEALTH SERVICES      DC Planning Services  CM consult      Choice offered to / List presented to:  C-3 Spouse        HH arranged  HH-2 PT      HH agency  [facility]   Status of service:  Completed, signed off Medicare Important Message given?   (If response is "NO", the following Medicare IM given date fields will be blank) Date Medicare IM given:   Date Additional Medicare IM given:    Discharge Disposition:  HOME W HOME HEALTH SERVICES  Per UR Regulation:  Reviewed for med. necessity/level of care/duration of stay  If discussed at Long Length of Stay Meetings, dates discussed:    Comments:  07/15/12 Spoke with patient and her husband about HHPT. They selected [facility] from the [facility] agency list. Contacted Debbie at [facility] and set up HHPT. Patient states that she has a rolling walker, wheelchair and tub bench. No equipment needs identified. Jacquelynn Cree RN, BSN, CCM

## 2012-07-30 ENCOUNTER — Encounter: Payer: Self-pay | Admitting: Neurology

## 2012-07-30 ENCOUNTER — Ambulatory Visit: Payer: Medicare Other | Admitting: Neurology

## 2012-07-31 ENCOUNTER — Encounter: Payer: Self-pay | Admitting: Neurology

## 2012-07-31 ENCOUNTER — Ambulatory Visit (INDEPENDENT_AMBULATORY_CARE_PROVIDER_SITE_OTHER): Payer: Medicare Other | Admitting: Neurology

## 2012-07-31 VITALS — BP 144/84 | HR 85 | Ht 62.5 in | Wt 166.0 lb

## 2012-07-31 DIAGNOSIS — I6350 Cerebral infarction due to unspecified occlusion or stenosis of unspecified cerebral artery: Secondary | ICD-10-CM

## 2012-07-31 DIAGNOSIS — I639 Cerebral infarction, unspecified: Secondary | ICD-10-CM

## 2012-07-31 DIAGNOSIS — R413 Other amnesia: Secondary | ICD-10-CM | POA: Insufficient documentation

## 2012-07-31 DIAGNOSIS — I2510 Atherosclerotic heart disease of native coronary artery without angina pectoris: Secondary | ICD-10-CM

## 2012-07-31 NOTE — Progress Notes (Addendum)
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, presented with confusion, delirium, gait difficulty,, TEE showed ejection fraction 65%, calcified mitral valve, no vegetation, was diagnosed with a septic endocarditis, was treated with prolonged antibiotics, antiplatelet agent, also with non-ST elevation MI, she also had past medical history of hypertension, anxiety, hyperlipidemia, she had a high school graduate, lives at home with her husband,  Ever since that event she has become much less active, only driving occasionally at short distance, also has short-term memory trouble, occasionally confusion, no longer cooking, she also complains of chronic low back pain, long-standing history of depression anxiety, she is taking Valium 5 mg 3 times a day, her husband has taken over her prescription medication management, but she tends to take a lot of supplements, as needed Tylenol, ibuprofen,  In May 20 fourth 2014, she woke up was noticed by her family to have confusion, unsteady gait, lasting for 24 hours, no seizure activity was noted, she was admitted to the hospital, May 24th through 27th,   MRI head:Two or three punctate acute infarctions in the left frontal region affecting the caudate head and left frontal lobe.  No swelling, hemorrhage or mass effect.  Moderate chronic small vessel changes elsewhere throughout the brain.    MRA HEAD: No major vessel occlusion or correctable proximal stenosis. Repeat echocardiogram showed ejection fraction 65%, calcified mitral valve, no vegetations, EEG was normal, she is not back to her normal self, very argumentary, especially to her daughter. Ultrasound of carotid artery showed no large vessel disease  Mother died age 75 from stroke, father died age 22 from  heart attack, no family history of dementia  Review of Systems  Out of a complete 14 system review, the patient complains of only the following symptoms, and all other reviewed systems are negative.   Constitutional:   Fatigue Cardiovascular:  Palpation Ear/Nose/Throat:  Ringing in ears Skin: N/A Eyes: N/A Respiratory: shortness of breath, snoring Gastroitestinal: Diarrhea    Hematology/Lymphatic:  Easy bruising Endocrine:  Feeling cold increased thirst flushing Musculoskeletal: Joints pain, cramps, achy muscles Allergy/Immunology: Allergies, skin sensitivity Neurological: Memory loss, confusion, weakness, slurred speech, dizziness, sleepiness, snoring Psychiatric:   Depression, anxiety, too much sleep, decreased energy, disinterested in activities, hallucinations  PHYSICAL EXAMINATOINS:  Generalized: In no acute distress  Neck: Supple, no carotid bruits   Cardiac: Regular rate rhythm  Pulmonary: Clear to auscultation bilaterally  Musculoskeletal: No deformity  Neurological examination  Mentation: Alert oriented to time, place, history taking, and causual conversation Mini-Mental Status Examination 28 out of 30, she missed 1 out of 3 recalls, not oriented to date,  Cranial nerve II-XII: Pupils were equal round reactive to light extraocular movements were full, visual field were full on confrontational test. facial sensation and strength were normal. hearing was intact to finger rubbing bilaterally. Uvula tongue midline.  head turning and shoulder shrug and were normal and symmetric.Tongue protrusion into cheek strength was normal.  Motor: mild left shoulder abduction, external rotation weakness.  Sensory: Intact to fine touch, pinprick, preserved vibratory sensation, and proprioception at toes.  Coordination: Normal finger to nose, heel-to-shin bilaterally there was no truncal ataxia  Gait: Rising up from seated position without assistance, decreased left arm swing,  dragging left leg mildly  Romberg signs: Negative  Deep tendon reflexes: Brachioradialis 2/2, biceps  2/2, triceps 2/2, patellar 2/2, Achilles 1/1, plantar responses were flexor bilaterally.  Assessment and plan: 77 years old Caucasian female, with mild short-term memory trouble, small vessel disease  1. her episode of worsening confusion likely due to decompensation in the setting of acute small stroke, with background of mild cognitive impairment 2. Laboratory evaluation to rule out treatable cause 3. continue physical therapy 4 return to clinic with Eber Wubben in 3 months, may consider adding on Aricept, Namenda, 60 minutes spent in coordinating her care, reviewing the chart, and documents,

## 2012-08-14 ENCOUNTER — Other Ambulatory Visit: Payer: Self-pay | Admitting: Internal Medicine

## 2012-08-28 ENCOUNTER — Other Ambulatory Visit: Payer: Self-pay | Admitting: Neurosurgery

## 2012-08-28 DIAGNOSIS — M549 Dorsalgia, unspecified: Secondary | ICD-10-CM

## 2012-09-05 ENCOUNTER — Telehealth: Payer: Self-pay | Admitting: Neurology

## 2012-09-08 ENCOUNTER — Encounter: Payer: Self-pay | Admitting: *Deleted

## 2012-09-08 ENCOUNTER — Telehealth: Payer: Self-pay | Admitting: *Deleted

## 2012-09-08 NOTE — Telephone Encounter (Signed)
Per Dr. Terrace Arabia ok to come off plavix 5 days prior to epidural injection and then restart afterwards.

## 2012-09-08 NOTE — Telephone Encounter (Signed)
Resent to Dr. Terrace Arabia

## 2012-09-09 NOTE — Telephone Encounter (Signed)
Faxed to Danielle at [facility].

## 2012-09-15 ENCOUNTER — Ambulatory Visit
Admission: RE | Admit: 2012-09-15 | Discharge: 2012-09-15 | Disposition: A | Discharge status: Home / Self Care | Payer: Medicare Other | Source: Ambulatory Visit | Attending: Neurosurgery | Admitting: Neurosurgery

## 2012-09-15 DIAGNOSIS — M549 Dorsalgia, unspecified: Secondary | ICD-10-CM

## 2012-09-15 MED ORDER — METHYLPREDNISOLONE ACETATE 40 MG/ML INJ SUSP (RADIOLOG
120.0000 mg | Freq: Once | INTRAMUSCULAR | Status: AC
  Administered 2012-09-15: 120 mg via EPIDURAL

## 2012-09-15 MED ORDER — IOHEXOL 180 MG/ML  SOLN
1.0000 mL | Freq: Once | INTRAMUSCULAR | Status: AC | PRN
  Administered 2012-09-15: 1 mL via EPIDURAL

## 2012-10-30 ENCOUNTER — Telehealth: Payer: Self-pay | Admitting: *Deleted

## 2012-10-30 NOTE — Telephone Encounter (Signed)
Spoke to patient and she is aware on appointment on 10-31-12

## 2012-10-31 ENCOUNTER — Ambulatory Visit (INDEPENDENT_AMBULATORY_CARE_PROVIDER_SITE_OTHER): Payer: Medicare Other | Admitting: Nurse Practitioner

## 2012-10-31 ENCOUNTER — Encounter: Payer: Self-pay | Admitting: Nurse Practitioner

## 2012-10-31 VITALS — BP 123/79 | HR 80 | Ht 63.0 in | Wt 166.0 lb

## 2012-10-31 DIAGNOSIS — R413 Other amnesia: Secondary | ICD-10-CM

## 2012-10-31 DIAGNOSIS — I2510 Atherosclerotic heart disease of native coronary artery without angina pectoris: Secondary | ICD-10-CM

## 2012-10-31 DIAGNOSIS — I639 Cerebral infarction, unspecified: Secondary | ICD-10-CM

## 2012-10-31 DIAGNOSIS — I6350 Cerebral infarction due to unspecified occlusion or stenosis of unspecified cerebral artery: Secondary | ICD-10-CM

## 2012-10-31 MED ORDER — DONEPEZIL HCL 5 MG PO TABS: 5.0000 mg | ORAL_TABLET | Freq: Every day | ORAL | Status: DC

## 2012-10-31 NOTE — Patient Instructions (Addendum)
Will begin Aricept 5 mg daily for one month then  increase to 2 tabs daily Followup in 6 months for repeat memory testing Use  cane for safe ambulation

## 2012-10-31 NOTE — Progress Notes (Signed)
[facility]  PATIENT: Frances Mendoza DOB: 02-04-1936   REASON FOR VISIT: Followup for memory loss   HISTORY OF PRESENT ILLNESS:Frances Mendoza, 77 year old female returns for follow up. 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, presented with confusion, delirium, gait difficulty,, TEE showed ejection fraction 65%, calcified mitral valve, no vegetation, was diagnosed with a septic endocarditis, was treated with prolonged antibiotics, antiplatelet agent, also with non-ST elevation MI, she also had past medical history of hypertension, anxiety, hyperlipidemia, she had a high school graduate, lives at home with her husband,  Ever since that event she has become much less active, only driving occasionally at short distance, also has short-term memory trouble, occasionally confusion, no longer cooking, she also complains of chronic low back pain, long-standing history of depression anxiety, she is taking Valium 5 mg 3 times a day, her husband has taken over her prescription medication management, but she tends to take a lot of supplements, as needed Tylenol, ibuprofen,  In May 20 fourth 2014, she woke up was noticed by her family to have confusion, unsteady gait, lasting for 24 hours, no seizure activity was noted, she was admitted to the hospital, May 24th through 27th,  MRI head:Two or three punctate acute infarctions in the left frontal region affecting the caudate head and left frontal lobe. No swelling, hemorrhage or mass effect. Moderate chronic small vessel changes elsewhere throughout the brain.  MRA HEAD: No major vessel occlusion or correctable proximal stenosis.  Repeat echocardiogram showed ejection fraction 65%, calcified mitral valve, no vegetations,  EEG was normal, she is not back to her normal self, very argumentary, especially to her daughter.  Ultrasound of carotid artery showed no large vessel disease  Mother died age 27 from stroke,  father died age 52 from heart attack, no family history of dementia  10/31/12: Returns for followup with her daughter. Memory loss is about the same. She does minimal cooking, can perform her ADLs. Daughter says she repeats the same questions over and over. She has long history of depression.    REVIEW OF SYSTEMS: Full 14 system review of systems performed and notable only for:  Constitutional: N/A  Cardiovascular: N/A  Ear/Nose/Throat: N/A  Skin: N/A  Eyes: N/A  Respiratory: Shortness of breath Gastroitestinal: N/A  Hematology/Lymphatic: N/A  Endocrine: N/A Musculoskeletal arthritis  Allergy/Immunology: N/A  Neurological: Memory loss  Psychiatric: Depression  anxiety   ALLERGIES: Allergies  Allergen Reactions  . Amoxicillin Anaphylaxis  . Penicillins Anaphylaxis  . Tolectin [Tolmetin Sodium] Anaphylaxis    Took at same time as amoxicillin when she had anaphylactic reaction.  . Ciprofloxacin Rash    Entire body looked as if sunburned, then skin peeled off.  . Prolactin   . Bactrim [Sulfamethoxazole W-Trimethoprim] Rash    ? Early rash on palms  . Codeine Nausea And Vomiting  . Contrast Media [Iodinated Diagnostic Agents] Hives    Patient had severe hives after Myelogram.  No issues breathing.  Returned for  a nerve root injection, was given Benadryl, mentioned some minor itching to daughter on the way home but no further issues. Was given a Benadryl today for this injection.   . Procardia [Nifedipine] Other (See Comments)    Causes aches down legs    HOME MEDICATIONS: Outpatient Prescriptions Prior to Visit  Medication Sig Dispense Refill  . albuterol (PROVENTIL HFA;VENTOLIN HFA) 108 (90 BASE) MCG/ACT inhaler Inhale 2 puffs into the lungs every 6 (six) hours as  needed for wheezing or shortness of breath.      . Ascorbic Acid (VITAMIN C PO) Take 2,000 mg by mouth daily.      . clindamycin (CLEOCIN) 300 MG capsule as needed.       . clopidogrel (PLAVIX) 75 MG tablet Take 1  tablet (75 mg total) by mouth daily with breakfast.  30 tablet  0  . diazepam (VALIUM) 5 MG tablet Take 5 mg by mouth every 8 (eight) hours as needed for anxiety. 3 daily      . fluticasone (FLONASE) 50 MCG/ACT nasal spray Place 1 spray into the nose 2 (two) times daily.        . hydrochlorothiazide (HYDRODIURIL) 25 MG tablet Take 1 tablet (25 mg total) by mouth daily.  30 tablet  6  . nitroGLYCERIN (NITROSTAT) 0.4 MG SL tablet Place 1 tablet (0.4 mg total) under the tongue every 5 (five) minutes x 3 doses as needed for chest pain.  25 tablet  11  . potassium chloride (K-DUR,KLOR-CON) 10 MEQ tablet Take 2 tablets (20 mEq total) by mouth daily.  30 tablet  6  . Selenium 200 MCG CAPS Take 1 capsule by mouth every other day.      . sertraline (ZOLOFT) 100 MG tablet Take 50 mg by mouth daily.       . simvastatin (ZOCOR) 20 MG tablet Take 1 tablet (20 mg total) by mouth daily at 6 PM.  30 tablet  0  . zinc gluconate 50 MG tablet Take 50 mg by mouth daily.      . Docosahexaenoic Acid (DHA COMPLETE PO) Take 450 mg by mouth every other day.       No facility-administered medications prior to visit.    PAST MEDICAL HISTORY: Past Medical History  Diagnosis Date  . Hypertension   . Diverticulosis   . Hemorrhoids, internal   . Anxiety   . Arthritis     osteoarthritis  . Vertigo   . 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 Endocarditis 10/18/2010    treated with vancomycin, gentamicin, and Cipro  . Hypokalemia   . Insomnia   . Renal insufficiency 11/29/2010  . CAD (coronary artery disease)     a. admx with CP c/w [place] 4/14 => LHC: pLAD 40%, RCA with lum irregs => med Rx  . HLD (hyperlipidemia)   . Back problem   . Depression     PAST SURGICAL HISTORY: Past Surgical History  Procedure Laterality Date  . Cholecystectomy  09/29/1999  . Abdominal hysterectomy  1979    full  . Bladder tuck  2007?  Marland Kitchen Tee without  cardioversion  10/10/2010     done with suggestion of small mitral vegetation.  ID was consulted for input and ID feels  patient with possible endocarditis with septic emboli    FAMILY HISTORY: Family History  Problem Relation Age of Onset  . Coronary artery disease      family history of  . Colon cancer      family history of  . Cerebral palsy Sister   . Colon cancer Brother   . Prostate cancer Brother     SOCIAL HISTORY: History   Social History  . Marital Status: Married    Spouse Name: Dimas Aguas    Number of Children: 3  . Years of Education: 12   Occupational History  .      patient  not employed   Social History Main Topics  . Smoking status: Never Smoker   . Smokeless tobacco: Never Used  . Alcohol Use: No  . Drug Use: No  . Sexual Activity: Not Currently   Other Topics Concern  . Not on file   Social History Narrative   Patient lives at home with spouse.   Caffeine Use: 2 cups daily     PHYSICAL EXAM  Filed Vitals:   10/31/12 1052  BP: 123/79  Pulse: 80  Height: 5\' 3"  (1.6 m)  Weight: 166 lb (75.297 kg)   Body mass index is 29.41 kg/(m^2).  Generalized: Well developed, in no acute distress  Head: normocephalic and atraumatic,. Oropharynx benign  Neck: Supple, no carotid bruits  Cardiac: Regular rate rhythm, no murmur  Musculoskeletal: No deformity   Neurological examination   Mentation: Alert MMSE 25/30, missing  items in orientation, calculation, recall and drawing a figure. AFT 10. Clock drawing 3/4.  Follows all commands speech and language fluent  Cranial nerve II-XII: Pupils were equal round reactive to light extraocular movements were full, visual field were full on confrontational test. Facial sensation and strength were normal. hearing was intact to finger rubbing bilaterally. Uvula tongue midline. head turning and shoulder shrug  were normal and symmetric.Tongue protrusion into cheek strength was normal. Motor: normal bulk and tone, full  strength in the BUE, BLE, fine finger movements normal, no pronator drift. No focal weakness Sensory: normal and symmetric to light touch, pinprick, and  vibration  Coordination: finger-nose-finger, heel-to-shin bilaterally, no dysmetria Reflexes: Brachioradialis 2/2, biceps 2/2, triceps 2/2, patellar 2/2, Achilles 1/1, plantar responses were flexor bilaterally. Gait and Station: Rising up from seated position without assistance, normal stance,  moderate stride, decreased left  arm swing, smooth turning, able to perform tiptoe, and heel walking without difficulty. Unsteady with tandem, no assistive device  DIAGNOSTIC DATA (LABS, IMAGING, TESTING) - I reviewed patient records, labs, notes, testing and imaging myself where available.  Lab Results  Component Value Date   WBC 11.3* 07/12/2012   HGB 14.3 07/12/2012   HCT 42.0 07/12/2012   MCV 90.5 07/12/2012   PLT 251 07/12/2012      Component Value Date/Time   NA 138 07/14/2012 0430   K 3.4* 07/14/2012 0430   CL 98 07/14/2012 0430   CO2 31 07/14/2012 0430   GLUCOSE 108* 07/14/2012 0430   BUN 12 07/14/2012 0430   CREATININE 0.70 07/14/2012 0430   CALCIUM 9.7 07/14/2012 0430   PROT 7.6 07/12/2012 2135   ALBUMIN 4.5 07/12/2012 2135   AST 19 07/12/2012 2135   ALT 12 07/12/2012 2135   ALKPHOS 74 07/12/2012 2135   BILITOT 0.3 07/12/2012 2135   GFRNONAA 83* 07/14/2012 0430   GFRAA >90 07/14/2012 0430   Lab Results  Component Value Date   CHOL 229* 07/13/2012   HDL 61 07/13/2012   LDLCALC 130* 07/13/2012   TRIG 192* 07/13/2012   CHOLHDL 3.8 07/13/2012   Lab Results  Component Value Date   HGBA1C 5.3 07/14/2012   No results found for this basename: VITAMINB12   Lab Results  Component Value Date   TSH 1.952 10/07/2010    ASSESSMENT AND PLAN  77 y.o. year old female  has a past medical history of Hypertension; Anxiety;  Cerebrovascular accident, embolic (10/18/2010); Septic embolism (10/18/2010); Endocarditis (10/18/2010); Hypokalemia; Insomnia; Renal  insufficiency (11/29/2010); CAD (coronary artery disease); HLD (hyperlipidemia); and Depression. here for follow up with memory.  Will begin Aricept 5 mg daily  for one month then  increase to 2 tabs daily Followup in 6 months for repeat memory testing Use  cane for safe ambulation Nilda Riggs, [facility], [facility], APRN  [facility] [address], [address] [place], [place] [address] [phone]

## 2012-11-17 ENCOUNTER — Telehealth: Payer: Self-pay | Admitting: Nurse Practitioner

## 2012-11-18 NOTE — Telephone Encounter (Signed)
Called patient left a message that I meant for her to take the medication in the am. Please switch to 10 am and see if this does not help. Call for further problems.

## 2012-12-02 ENCOUNTER — Other Ambulatory Visit: Payer: Self-pay | Admitting: Cardiovascular Disease

## 2012-12-08 ENCOUNTER — Other Ambulatory Visit: Payer: Self-pay | Admitting: Neurosurgery

## 2012-12-08 DIAGNOSIS — M549 Dorsalgia, unspecified: Secondary | ICD-10-CM

## 2012-12-12 ENCOUNTER — Other Ambulatory Visit: Payer: Self-pay | Admitting: Gastroenterology

## 2012-12-17 ENCOUNTER — Ambulatory Visit
Admission: RE | Admit: 2012-12-17 | Discharge: 2012-12-17 | Disposition: A | Discharge status: Home / Self Care | Payer: Medicare Other | Source: Ambulatory Visit | Attending: Neurosurgery | Admitting: Neurosurgery

## 2012-12-17 VITALS — BP 148/74 | HR 78

## 2012-12-17 DIAGNOSIS — M549 Dorsalgia, unspecified: Secondary | ICD-10-CM

## 2012-12-17 MED ORDER — METHYLPREDNISOLONE ACETATE 40 MG/ML INJ SUSP (RADIOLOG
120.0000 mg | Freq: Once | INTRAMUSCULAR | Status: AC
  Administered 2012-12-17: 120 mg via EPIDURAL

## 2012-12-17 MED ORDER — IOHEXOL 180 MG/ML  SOLN
1.0000 mL | Freq: Once | INTRAMUSCULAR | Status: AC | PRN
  Administered 2012-12-17: 1 mL via EPIDURAL

## 2012-12-30 ENCOUNTER — Other Ambulatory Visit: Payer: Self-pay | Admitting: Cardiovascular Disease

## 2012-12-30 ENCOUNTER — Other Ambulatory Visit (HOSPITAL_COMMUNITY): Payer: Self-pay | Admitting: Physician Assistant

## 2013-01-26 ENCOUNTER — Other Ambulatory Visit (HOSPITAL_COMMUNITY): Payer: Self-pay | Admitting: Cardiovascular Disease

## 2013-01-31 ENCOUNTER — Other Ambulatory Visit (HOSPITAL_COMMUNITY): Payer: Self-pay | Admitting: Cardiovascular Disease

## 2013-02-06 ENCOUNTER — Other Ambulatory Visit: Payer: Self-pay | Admitting: Neurosurgery

## 2013-02-06 DIAGNOSIS — M549 Dorsalgia, unspecified: Secondary | ICD-10-CM

## 2013-02-08 ENCOUNTER — Other Ambulatory Visit: Payer: Self-pay | Admitting: Cardiovascular Disease

## 2013-02-11 ENCOUNTER — Other Ambulatory Visit: Payer: Medicare Other

## 2013-02-22 ENCOUNTER — Other Ambulatory Visit (HOSPITAL_COMMUNITY): Payer: Self-pay | Admitting: Cardiovascular Disease

## 2013-03-05 ENCOUNTER — Other Ambulatory Visit: Payer: Self-pay | Admitting: Gastroenterology

## 2013-03-05 DIAGNOSIS — R109 Unspecified abdominal pain: Secondary | ICD-10-CM

## 2013-03-09 ENCOUNTER — Other Ambulatory Visit: Payer: Self-pay | Admitting: Cardiovascular Disease

## 2013-03-16 ENCOUNTER — Inpatient Hospital Stay
Admission: RE | Admit: 2013-03-16 | Discharge: 2013-03-16 | Disposition: A | Discharge status: Home / Self Care | Payer: Medicare Other | Source: Ambulatory Visit | Attending: Gastroenterology | Admitting: Gastroenterology

## 2013-03-20 ENCOUNTER — Ambulatory Visit
Admission: RE | Admit: 2013-03-20 | Discharge: 2013-03-20 | Disposition: A | Discharge status: Home / Self Care | Payer: Medicare Other | Source: Ambulatory Visit | Attending: Gastroenterology | Admitting: Gastroenterology

## 2013-03-20 DIAGNOSIS — R109 Unspecified abdominal pain: Secondary | ICD-10-CM

## 2013-03-20 MED ORDER — IOHEXOL 300 MG/ML  SOLN
100.0000 mL | Freq: Once | INTRAMUSCULAR | Status: AC | PRN
  Administered 2013-03-20: 100 mL via INTRAVENOUS

## 2013-03-21 ENCOUNTER — Other Ambulatory Visit (HOSPITAL_COMMUNITY): Payer: Self-pay | Admitting: Cardiology

## 2013-03-23 ENCOUNTER — Other Ambulatory Visit: Payer: Self-pay

## 2013-03-23 MED ORDER — POTASSIUM CHLORIDE ER 10 MEQ PO TBCR: EXTENDED_RELEASE_TABLET | ORAL | Status: DC

## 2013-03-24 ENCOUNTER — Other Ambulatory Visit (HOSPITAL_COMMUNITY): Payer: Self-pay | Admitting: Cardiology

## 2013-04-09 ENCOUNTER — Other Ambulatory Visit: Payer: Self-pay | Admitting: Cardiovascular Disease

## 2013-04-10 ENCOUNTER — Other Ambulatory Visit: Payer: Self-pay | Admitting: *Deleted

## 2013-04-10 MED ORDER — HYDROCHLOROTHIAZIDE 25 MG PO TABS: ORAL_TABLET | ORAL | Status: DC

## 2013-04-13 ENCOUNTER — Telehealth: Payer: Self-pay | Admitting: *Deleted

## 2013-04-13 MED ORDER — POTASSIUM CHLORIDE ER 10 MEQ PO TBCR: EXTENDED_RELEASE_TABLET | ORAL | Status: DC

## 2013-04-13 NOTE — Telephone Encounter (Signed)
Husband calls for refills potassium

## 2013-04-30 ENCOUNTER — Ambulatory Visit: Payer: Medicare Other | Admitting: Nurse Practitioner

## 2013-05-01 ENCOUNTER — Encounter: Payer: Self-pay | Admitting: Nurse Practitioner

## 2013-05-01 ENCOUNTER — Ambulatory Visit (INDEPENDENT_AMBULATORY_CARE_PROVIDER_SITE_OTHER): Payer: Medicare Other | Admitting: Nurse Practitioner

## 2013-05-01 ENCOUNTER — Encounter (INDEPENDENT_AMBULATORY_CARE_PROVIDER_SITE_OTHER): Payer: Self-pay

## 2013-05-01 VITALS — BP 138/80 | HR 77 | Ht 64.0 in | Wt 150.0 lb

## 2013-05-01 DIAGNOSIS — R413 Other amnesia: Secondary | ICD-10-CM

## 2013-05-01 DIAGNOSIS — I6350 Cerebral infarction due to unspecified occlusion or stenosis of unspecified cerebral artery: Secondary | ICD-10-CM

## 2013-05-01 DIAGNOSIS — I639 Cerebral infarction, unspecified: Secondary | ICD-10-CM

## 2013-05-01 DIAGNOSIS — I2510 Atherosclerotic heart disease of native coronary artery without angina pectoris: Secondary | ICD-10-CM

## 2013-05-01 MED ORDER — DONEPEZIL HCL 10 MG PO TABS: 10.0000 mg | ORAL_TABLET | Freq: Every day | ORAL | Status: DC

## 2013-05-01 NOTE — Patient Instructions (Signed)
Will change Aricept to 10mg  tab take daily in the am Memory score is stable F/U in 6 months

## 2013-05-01 NOTE — Progress Notes (Signed)
[facility]  PATIENT: Frances Mendoza DOB: 12-12-1935   REASON FOR VISIT: Followup for memory loss   HISTORY OF PRESENT ILLNESS: Frances Mendoza, 78 year old female returns for followup. She was last in the office 10/31/2012. She has a history of memory loss. She was placed on Aricept at her last visit with improvement in her memory scores. She said she was walking 30 minutes a day several times a week up until the last couple of months due to the weather.  She is anxious to get out walking again. 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, presented with confusion, delirium, gait difficulty,, TEE showed ejection fraction 65%, calcified mitral valve, no vegetation, was diagnosed with a septic endocarditis, was treated with prolonged antibiotics, antiplatelet agent, also with non-ST elevation MI, she also had past medical history of hypertension, anxiety, hyperlipidemia, she had a high school graduate, lives at home with her husband,  Ever since that event she has become much less active, only driving occasionally at short distance, also has short-term memory trouble, occasionally confusion, no longer cooking, she also complains of chronic low back pain, long-standing history of depression anxiety, she is taking Valium 5 mg 3 times a day, her husband has taken over her prescription medication management, but she tends to take a lot of supplements, as needed Tylenol, ibuprofen,  In May 20 fourth 2014, she woke up was noticed by her family to have confusion, unsteady gait, lasting for 24 hours, no seizure activity was noted, she was admitted to the hospital, May 24th through 27th,  MRI head:Two or three punctate acute infarctions in the left frontal region affecting the caudate head and left frontal lobe. No swelling, hemorrhage or mass effect. Moderate chronic small vessel changes elsewhere throughout the brain.  MRA  HEAD: No major vessel occlusion or correctable proximal stenosis.  Repeat echocardiogram showed ejection fraction 65%, calcified mitral valve, no vegetations,  EEG was normal, she is not back to her normal self, very argumentary, especially to her daughter.  Ultrasound of carotid artery showed no large vessel disease  Mother died age 76 from stroke, father died age 76 from heart attack, no family history of dementia  10/31/12: Returns for followup with her daughter. Memory loss is about the same. She does minimal cooking, can perform her ADLs. Daughter says she repeats the same questions over and over. She has long history of depression.   REVIEW OF SYSTEMS: Full 14 system review of systems performed and notable only for those listed, all others are neg:  Constitutional: N/A  Cardiovascular: N/A  Ear/Nose/Throat: N/A  Skin: N/A  Eyes: N/A  Respiratory: N/A  Gastroitestinal: Difficulty urinating, see Dr. Earlene Plateravis  Hematology/Lymphatic: N/A  Endocrine: N/A Musculoskeletal: Joint pain Allergy/Immunology: N/A  Neurological: N/A Psychiatric: Depression anxiety  ALLERGIES: Allergies  Allergen Reactions  . Amoxicillin Anaphylaxis  . Penicillins Anaphylaxis  . Tolectin [Tolmetin Sodium] Anaphylaxis    Took at same time as amoxicillin when she had anaphylactic reaction.  . Ciprofloxacin Rash    Entire body looked as if sunburned, then skin peeled off.  . Prolactin   . Bactrim [Sulfamethoxazole-Trimethoprim] Rash    ? Early rash on palms  . Codeine Nausea And Vomiting  . Contrast Media [Iodinated Diagnostic Agents] Hives    Patient had severe hives after Myelogram.  No issues breathing.  Returned for  a nerve root injection, was given Benadryl, mentioned some minor itching  to daughter on the way home but no further issues. Was given a Benadryl today for this injection.   . Procardia [Nifedipine] Other (See Comments)    Causes aches down legs    HOME MEDICATIONS: Outpatient Prescriptions  Prior to Visit  Medication Sig Dispense Refill  . albuterol (PROVENTIL HFA;VENTOLIN HFA) 108 (90 BASE) MCG/ACT inhaler Inhale 2 puffs into the lungs every 6 (six) hours as needed for wheezing or shortness of breath.      . Ascorbic Acid (VITAMIN C PO) Take 2,000 mg by mouth daily.      . clindamycin (CLEOCIN) 300 MG capsule as needed.       . clopidogrel (PLAVIX) 75 MG tablet Take 1 tablet (75 mg total) by mouth daily with breakfast.  30 tablet  0  . diazepam (VALIUM) 5 MG tablet Take 5 mg by mouth every 8 (eight) hours as needed for anxiety. 3 daily      . donepezil (ARICEPT) 5 MG tablet Take 1 tablet (5 mg total) by mouth daily. 1 tab daily for 1 month then 2 tabs daily  60 tablet  6  . fluticasone (FLONASE) 50 MCG/ACT nasal spray Place 1 spray into the nose 2 (two) times daily.        . hydrochlorothiazide (HYDRODIURIL) 25 MG tablet TAKE 1 TABLET BY MOUTH EVERY DAY  30 tablet  0  . nitroGLYCERIN (NITROSTAT) 0.4 MG SL tablet Place 1 tablet (0.4 mg total) under the tongue every 5 (five) minutes x 3 doses as needed for chest pain.  25 tablet  11  . potassium chloride (KLOR-CON 10) 10 MEQ tablet TAKE 2 TABLETS BY MOUTH DAILY  60 tablet  1  . Selenium 200 MCG CAPS Take 1 capsule by mouth every other day.      . sertraline (ZOLOFT) 100 MG tablet Take 50 mg by mouth daily.       . simvastatin (ZOCOR) 20 MG tablet Take 1 tablet (20 mg total) by mouth daily at 6 PM.  30 tablet  0  . zinc gluconate 50 MG tablet Take 50 mg by mouth daily.       No facility-administered medications prior to visit.    PAST MEDICAL HISTORY: Past Medical History  Diagnosis Date  . Hypertension   . Diverticulosis   . Hemorrhoids, internal   . Anxiety   . Arthritis     osteoarthritis  . Vertigo   . 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 Endocarditis 10/18/2010    treated with vancomycin, gentamicin, and Cipro  . Hypokalemia   .  Insomnia   . Renal insufficiency 11/29/2010  . CAD (coronary artery disease)     a. admx with CP c/w [place] 4/14 => LHC: pLAD 40%, RCA with lum irregs => med Rx  . HLD (hyperlipidemia)   . Back problem   . Depression     PAST SURGICAL HISTORY: Past Surgical History  Procedure Laterality Date  . Cholecystectomy  09/29/1999  . Abdominal hysterectomy  1979    full  . Bladder tuck  2007?  Marland Kitchen Tee without cardioversion  10/10/2010     done with suggestion of small mitral vegetation.  ID was consulted for input and ID feels  patient with possible endocarditis with septic emboli    FAMILY HISTORY: Family History  Problem Relation Age of Onset  . Coronary artery disease      family  history of  . Colon cancer      family history of  . Cerebral palsy Sister   . Colon cancer Brother   . Prostate cancer Brother     SOCIAL HISTORY: History   Social History  . Marital Status: Married    Spouse Name: Dimas Aguas    Number of Children: 3  . Years of Education: 12   Occupational History  .      patient not employed  . Retired    Social History Main Topics  . Smoking status: Never Smoker   . Smokeless tobacco: Never Used  . Alcohol Use: No  . Drug Use: No  . Sexual Activity: Not Currently   Other Topics Concern  . Not on file   Social History Narrative   Patient lives at home with spouse. Howard   Caffeine Use: 2 cups daily   Patient has 3 children.    Patient is retired.            PHYSICAL EXAM  Filed Vitals:   05/01/13 1001  BP: 138/80  Pulse: 77  Height: 5\' 4"  (1.626 m)  Weight: 150 lb (68.04 kg)   Body mass index is 25.73 kg/(m^2).  Generalized: Well developed, in no acute distress  Head: normocephalic and atraumatic,. Oropharynx benign  Neck: Supple, no carotid bruits  Cardiac: Regular rate rhythm, no murmur  Musculoskeletal: No deformity   Neurological examination   Mentation: Alert oriented to time, place, history taking. MMSE 30/30. AFT 16. Follows all  commands speech and language fluent  Cranial nerve II-XII: Pupils were equal round reactive to light extraocular movements were full, visual field were full on confrontational test. Facial sensation and strength were normal. hearing was intact to finger rubbing bilaterally. Uvula tongue midline. head turning and shoulder shrug were normal and symmetric.Tongue protrusion into cheek strength was normal. Motor: normal bulk and tone, full strength in the BUE, BLE,  No focal weakness Sensory: normal and symmetric to light touch, pinprick, and  vibration  Coordination: finger-nose-finger, heel-to-shin bilaterally, no dysmetria Reflexes: Brachioradialis 2/2, biceps 2/2, triceps 2/2, patellar 2/2, Achilles 1/1, plantar responses were flexor bilaterally. Gait and Station: Rising up from seated position without assistance, normal stance,  moderate stride, decreased arm swing on the left, smooth turning, able to perform tiptoe, and heel walking without difficulty. Tandem gait is mildly unsteady. No assistive device  DIAGNOSTIC DATA (LABS, IMAGING, TESTING) -  ASSESSMENT AND PLAN  78 y.o. year old female  has a past medical history of Hypertension;  Anxiety; Arthritis; Vertigo; Cerebrovascular accident, embolic (10/18/2010); Septic embolism (10/18/2010); Endocarditis (10/18/2010); Hypokalemia; Insomnia; Renal insufficiency (11/29/2010); CAD (coronary artery disease); HLD (hyperlipidemia); Back problem; and Depression. here to followup for her memory.MMSE 30/30.   Will change Aricept to 10mg  tab take daily in the am F/U in 6 months Nilda Riggs, [facility], [facility], APRN  [facility] [address], [address] [place], [place] [address] [phone]

## 2013-05-07 ENCOUNTER — Ambulatory Visit (INDEPENDENT_AMBULATORY_CARE_PROVIDER_SITE_OTHER): Payer: Medicare Other | Admitting: Cardiovascular Disease

## 2013-05-07 ENCOUNTER — Encounter: Payer: Self-pay | Admitting: Cardiovascular Disease

## 2013-05-07 VITALS — BP 112/64 | HR 83 | Ht 64.0 in | Wt 150.0 lb

## 2013-05-07 DIAGNOSIS — I2510 Atherosclerotic heart disease of native coronary artery without angina pectoris: Secondary | ICD-10-CM

## 2013-05-07 DIAGNOSIS — I340 Nonrheumatic mitral (valve) insufficiency: Secondary | ICD-10-CM

## 2013-05-07 DIAGNOSIS — I059 Rheumatic mitral valve disease, unspecified: Secondary | ICD-10-CM

## 2013-05-07 NOTE — Assessment & Plan Note (Signed)
She is doing well.  She presented with bacteremia and septic emboli due to endocarditis. At that time, she had positive cardiac enzymes but also had a very large stroke. I presume that she had septic emboli down her coronary arteries as well as up to her brain.  She was in the intensive care and for a very prolonged time and then  required a very long recovery in the rehab unit.  . She now is doing quite a bit better. She's had a subsequent  heart catheterization which revealed moderate irregularities.  We'll continue with her current medications.  She is not having any symptoms

## 2013-05-07 NOTE — Progress Notes (Signed)
Frances Mendoza Date of Birth  Apr 29, 1935 [facility]     [place] Office  [address]    [address]   [address] [place], [place]  [id number]    [place]  [id number] [phone]  Fax  [phone]  [phone]  Fax [phone]   Problem list 1. Endocarditis 2.  Septic emboli leading to stroke   History of Present Illness:  [name] is a 78 yo with a hx of bacterial endocarditis with subsequent septic emboli and CVA.  She was hospitalized in November for CP.  She ruled out for MI.  She complains of lots of pain in the back of both hips radiating down to her legs. She thinks may be related to one of her medications. He typically hurts worse when she stands up and walks around. It does not hurt at night when she is lying in bed.  The pain occurs whenever she stands or walks - not when she is lying down.  She denies any cardiac issues.  May 23, 2012  [name] awoke with chest pain and left pain.  The pain lasted for 1-2 hours.  Her son checked her BP and HR which were normal.    The severe pain has resolved but her chest is still not quite normal.    She has had some pains in her chest in the past but this was more severe.  The pain resolved with a valium.  The pains were no pleuretic.  She had dyspnea associated with the pain.  No diaphoresis.  No syncope.  No palpitations.   No fever/ chills/ blood in urine or stool.   She gets some exercise - she has some dyspnea but no angina.    She has occasional episodes of palptations.  She feels like her heart is racing for 5 - 10 minutes.    She takes the valium 2-3 times a day.    She has chronic back pain and has been told that she needs to have surgery.  She does not want to have surgery.    May 07, 2013;  She is doing OK.  She is here alone today.  Has lots of anxiety. She has multiple general complaints.      Current Outpatient Prescriptions on File Prior to Visit  Medication Sig Dispense  Refill  . albuterol (PROVENTIL HFA;VENTOLIN HFA) 108 (90 BASE) MCG/ACT inhaler Inhale 2 puffs into the lungs every 6 (six) hours as needed for wheezing or shortness of breath.      . Ascorbic Acid (VITAMIN C PO) Take 2,000 mg by mouth daily.      . clindamycin (CLEOCIN) 300 MG capsule as needed.       . clopidogrel (PLAVIX) 75 MG tablet Take 1 tablet (75 mg total) by mouth daily with breakfast.  30 tablet  0  . diazepam (VALIUM) 5 MG tablet Take 5 mg by mouth every 8 (eight) hours as needed for anxiety. 3 daily      . donepezil (ARICEPT) 10 MG tablet Take 1 tablet (10 mg total) by mouth daily.  30 tablet  6  . fluticasone (FLONASE) 50 MCG/ACT nasal spray Place 1 spray into the nose 2 (two) times daily.        . hydrochlorothiazide (HYDRODIURIL) 25 MG tablet TAKE 1 TABLET BY MOUTH EVERY DAY  30 tablet  0  . nitroGLYCERIN (NITROSTAT) 0.4 MG SL tablet Place 1 tablet (0.4 mg total) under the  tongue every 5 (five) minutes x 3 doses as needed for chest pain.  25 tablet  11  . potassium chloride (KLOR-CON 10) 10 MEQ tablet TAKE 2 TABLETS BY MOUTH DAILY  60 tablet  1  . Selenium 200 MCG CAPS Take 1 capsule by mouth every other day.      . sertraline (ZOLOFT) 100 MG tablet Take 50 mg by mouth daily.       . simvastatin (ZOCOR) 20 MG tablet Take 1 tablet (20 mg total) by mouth daily at 6 PM.  30 tablet  0  . zinc gluconate 50 MG tablet Take 50 mg by mouth daily.      . [DISCONTINUED] potassium chloride (K-DUR,KLOR-CON) 10 MEQ tablet TAKE 2 TABLETS BY MOUTH DAILY  60 tablet  0   No current facility-administered medications on file prior to visit.    Allergies  Allergen Reactions  . Amoxicillin Anaphylaxis  . Penicillins Anaphylaxis  . Tolectin [Tolmetin Sodium] Anaphylaxis    Took at same time as amoxicillin when she had anaphylactic reaction.  . Ciprofloxacin Rash    Entire body looked as if sunburned, then skin peeled off.  . Prolactin   . Bactrim [Sulfamethoxazole-Trimethoprim] Rash    ? Early  rash on palms  . Codeine Nausea And Vomiting  . Contrast Media [Iodinated Diagnostic Agents] Hives    Patient had severe hives after Myelogram.  No issues breathing.  Returned for  a nerve root injection, was given Benadryl, mentioned some minor itching to daughter on the way home but no further issues. Was given a Benadryl today for this injection.   . Procardia [Nifedipine] Other (See Comments)    Causes aches down legs    Past Medical History  Diagnosis Date  . Hypertension   . Diverticulosis   . Hemorrhoids, internal   . Anxiety   . Arthritis     osteoarthritis  . Vertigo   . 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 Endocarditis 10/18/2010    treated with vancomycin, gentamicin, and Cipro  . Hypokalemia   . Insomnia   . Renal insufficiency 11/29/2010  . CAD (coronary artery disease)     a. admx with CP c/w [place] 4/14 => LHC: pLAD 40%, RCA with lum irregs => med Rx  . HLD (hyperlipidemia)   . Back problem   . Depression     Past Surgical History  Procedure Laterality Date  . Cholecystectomy  09/29/1999  . Abdominal hysterectomy  1979    full  . Bladder tuck  2007?  Marland Kitchen Tee without cardioversion  10/10/2010     done with suggestion of small mitral vegetation.  ID was consulted for input and ID feels  patient with possible endocarditis with septic emboli    History  Smoking status  . Never Smoker   Smokeless tobacco  . Never Used    History  Alcohol Use No    Family History  Problem Relation Age of Onset  . Coronary artery disease      family history of  . Colon cancer      family history of  . Cerebral palsy Sister   . Colon cancer Brother   . Prostate cancer Brother     Reviw of Systems:  Reviewed in the HPI.  All other systems are negative.  Physical Exam: Blood pressure 112/64, pulse 83, height 5\' 4"  (1.626 m), weight 150 lb (68.04  kg). General: Well developed, well  nourished, in no acute distress.  Head: Normocephalic, atraumatic, sclera non-icteric, mucus membranes are moist,   Neck: Supple. Negative for carotid bruits. JVD not elevated.  Lungs: Clear bilaterally to auscultation without wheezes, rales, or rhonchi. Breathing is unlabored.  Heart: RRR with S1 S2. She has a  2/6 systolic murmur  Abdomen: Soft, non-tender, non-distended with normoactive bowel sounds. No hepatomegaly. No rebound/guarding. No obvious abdominal masses.  Msk:  Strength and tone appear normal for age.  Extremities: No clubbing or cyanosis. No edema.  Distal pedal pulses are 2+ and equal bilaterally.  Her leg pulses are especially good. She has some arthritis in her hands.  Neuro: Alert and oriented X 3. Moves all extremities spontaneously.  Psych:  Responds to questions appropriately with a normal affect.  ECG:  May 07, 2013:  NSR , LAE, previous ant. MI, old Inf. MI.   Assessment / Plan:

## 2013-05-07 NOTE — Assessment & Plan Note (Signed)
She has MR and was presumed to have endocarditis when she had multiple septic emboli to her brain and also possible to her heart.   She required a prolonged hospital stay but has made remarkable progress.    She is hemodynamically stable .

## 2013-05-07 NOTE — Patient Instructions (Signed)
Your physician wants you to follow-up in: 1 year  You will receive a reminder letter in the mail two months in advance. If you don't receive a letter, please call our office to schedule the follow-up appointment.  Your physician recommends that you continue on your current medications as directed. Please refer to the Current Medication list given to you today.  

## 2013-05-09 ENCOUNTER — Other Ambulatory Visit: Payer: Self-pay | Admitting: Cardiovascular Disease

## 2013-06-08 ENCOUNTER — Other Ambulatory Visit: Payer: Self-pay | Admitting: *Deleted

## 2013-06-08 MED ORDER — HYDROCHLOROTHIAZIDE 25 MG PO TABS: ORAL_TABLET | ORAL | Status: DC

## 2013-06-08 MED ORDER — POTASSIUM CHLORIDE ER 10 MEQ PO TBCR: EXTENDED_RELEASE_TABLET | ORAL | Status: DC

## 2013-08-12 ENCOUNTER — Other Ambulatory Visit: Payer: Self-pay

## 2013-08-12 MED ORDER — DONEPEZIL HCL 10 MG PO TABS: 10.0000 mg | ORAL_TABLET | Freq: Every day | ORAL | Status: DC

## 2013-11-03 ENCOUNTER — Ambulatory Visit: Payer: Medicare Other | Admitting: Neurology

## 2013-11-16 ENCOUNTER — Encounter: Payer: Self-pay | Admitting: Neurology

## 2013-11-16 ENCOUNTER — Encounter (INDEPENDENT_AMBULATORY_CARE_PROVIDER_SITE_OTHER): Payer: Self-pay

## 2013-11-16 ENCOUNTER — Ambulatory Visit (INDEPENDENT_AMBULATORY_CARE_PROVIDER_SITE_OTHER): Payer: Medicare Other | Admitting: Neurology

## 2013-11-16 VITALS — BP 134/74 | HR 86 | Ht 64.0 in | Wt 150.0 lb

## 2013-11-16 DIAGNOSIS — I63139 Cerebral infarction due to embolism of unspecified carotid artery: Secondary | ICD-10-CM

## 2013-11-16 DIAGNOSIS — R413 Other amnesia: Secondary | ICD-10-CM

## 2013-11-16 DIAGNOSIS — I63239 Cerebral infarction due to unspecified occlusion or stenosis of unspecified carotid arteries: Secondary | ICD-10-CM

## 2013-11-16 DIAGNOSIS — I2510 Atherosclerotic heart disease of native coronary artery without angina pectoris: Secondary | ICD-10-CM

## 2013-11-16 MED ORDER — DONEPEZIL HCL 10 MG PO TABS: 10.0000 mg | ORAL_TABLET | Freq: Every day | ORAL | Status: DC

## 2013-11-16 NOTE — Progress Notes (Signed)
History of present illness: 78  years old Caucasian female, is referred by her primary care physician for hospital followup, she is accompanied by her husband, and daughter,  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, presented with confusion, delirium, gait difficulty, TEE showed ejection fraction 65%, calcified mitral valve, no vegetation, was diagnosed with a septic endocarditis, was treated with prolonged antibiotics, antiplatelet agent, also with non-ST elevation MI, she also had past medical history of hypertension, anxiety, hyperlipidemia, she had a high school graduate, lives at home with her husband,  Ever since that event she has become much less active, only driving occasionally at short distance, also has short-term memory trouble, occasionally confusion, no longer cooking, she also complains of chronic low back pain, long-standing history of depression anxiety, she is taking Valium 5 mg 3 times a day, her husband has taken over her prescription medication management, but she tends to take a lot of supplements, as needed Tylenol, ibuprofen,  In May 20 fourth 2014, she woke up was noticed by her family to have confusion, unsteady gait, lasting for 24 hours, no seizure activity was noted, she was admitted to the hospital, May 24th through 27th, 2014.  MRI head again showed acute infarction: Two or three punctate acute infarctions in the left frontal region affecting the caudate head and left frontal lobe.  No swelling, hemorrhage or mass effect.  Moderate chronic small vessel changes elsewhere throughout the brain.    MRA HEAD: No major vessel occlusion or correctable proximal stenosis. Repeat echocardiogram showed ejection fraction 65%, calcified mitral valve, no vegetations, EEG was normal, she is not back to her normal self, very argumentary, especially to her daughter. Ultrasound of carotid artery showed no large vessel disease  Mother died age 59 from  stroke, father died age 53 from heart attack, no family history of dementia  UPDATE Sept 28th 2015: Her husband is helping with her medications, she is alone at visit, she is overall doing well, thinking she is improving. She works in her garden a lot, still active at home, cooking Sunday lunch for her extended family.  She has no trouble walking, she fell sometimes when turning quickly    Review of Systems  Out of a complete 14 system review, the patient complains of only the following symptoms, and all other reviewed systems are negative.  Memory loss, bruise easily, tired, excessive sweating.     PHYSICAL EXAMINATOINS:  Generalized: In no acute distress  Neck: Supple, no carotid bruits   Cardiac: Regular rate rhythm  Pulmonary: Clear to auscultation bilaterally  Musculoskeletal: No deformity  Neurological examination  Mentation: Alert oriented to time, place, history taking, and causual conversation Mini-Mental Status Examination 30 out of 30  Cranial nerve II-XII: Pupils were equal round reactive to light extraocular movements were full, visual field were full on confrontational test. facial sensation and strength were normal. hearing was intact to finger rubbing bilaterally. Uvula tongue midline.  head turning and shoulder shrug and were normal and symmetric.Tongue protrusion into cheek strength was normal.  Motor: mild weakness at the right hand grip  Sensory: Intact to fine touch, pinprick, preserved vibratory sensation, and proprioception at toes.  Coordination: Normal finger to nose, heel-to-shin bilaterally there was no truncal ataxia  Gait: Rising up from seated position without assistance, decreased left arm swing, dragging left leg mildly  Romberg signs: Negative  Deep tendon reflexes: Brachioradialis 2/2, biceps 2/2, triceps 2/2, patellar 2/2, Achilles 1/1, plantar responses were  flexor bilaterally.  Assessment and plan: 78 years old Caucasian female, with mild  short-term memory trouble, multiple embolic stroke in the past, overall doing very well, Mini-Mental Status Examination is 30 out of 30 today.  1. Continue Aricept 10 mg every day 2. She is on Plavix daily, 3. Continue moderate exercise, followup with her primary care physician. only return to clinic for new issues,

## 2014-01-06 ENCOUNTER — Encounter: Payer: Self-pay | Admitting: Neurology

## 2014-01-12 ENCOUNTER — Encounter: Payer: Self-pay | Admitting: Neurology

## 2014-01-28 ENCOUNTER — Encounter (HOSPITAL_COMMUNITY): Payer: Self-pay | Admitting: Cardiology

## 2014-02-26 ENCOUNTER — Encounter: Payer: Self-pay | Admitting: Cardiovascular Disease

## 2014-02-26 ENCOUNTER — Ambulatory Visit (INDEPENDENT_AMBULATORY_CARE_PROVIDER_SITE_OTHER): Payer: Medicare Other | Admitting: Cardiovascular Disease

## 2014-02-26 VITALS — BP 136/72 | HR 88 | Ht 64.0 in | Wt 150.8 lb

## 2014-02-26 DIAGNOSIS — I10 Essential (primary) hypertension: Secondary | ICD-10-CM

## 2014-02-26 DIAGNOSIS — I340 Nonrheumatic mitral (valve) insufficiency: Secondary | ICD-10-CM

## 2014-02-26 NOTE — Progress Notes (Signed)
Frances Mendoza July Date of Birth  June 01, 1935 [facility]     [place] Office  [address]    [address]   [address] [place], [place]  [address]    [place], [place]  [address] [phone]  Fax  [phone]  [phone]  Fax [phone]   Problem list 1. Endocarditis 2.  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  She was hospitalized in November for CP.  She ruled out for MI.  She complains of lots of pain in the back of both hips radiating down to her legs. She thinks may be related to one of her medications. He typically hurts worse when she stands up and walks around. It does not hurt at night when she is lying in bed.  The pain occurs whenever she stands or walks - not when she is lying down.  She denies any cardiac issues.  May 23, 2012  Frances Mendoza awoke with chest pain and left pain.  The pain lasted for 1-2 hours.  Her son checked her BP and HR which were normal.    The severe pain has resolved but her chest is still not quite normal.    She has had some pains in her chest in the past but this was more severe.  The pain resolved with a valium.  The pains were no pleuretic.  She had dyspnea associated with the pain.  No diaphoresis.  No syncope.  No palpitations.   No fever/ chills/ blood in urine or stool.   She gets some exercise - she has some dyspnea but no angina.    She has occasional episodes of palptations.  She feels like her heart is racing for 5 - 10 minutes.    She takes the valium 2-3 times a day.    She has chronic back pain and has been told that she needs to have surgery.  She does not want to have surgery.    May 07, 2013;  She is doing OK.  She is here alone today.  Has lots of anxiety. She has multiple general complaints.   Jan. 8, 2016:  Frances Mendoza is seen with her daughter today.  She has had continued shortness  of breath . She had an echo at [facility] which is basically unchanged from our echo She feels well When she went to her medical doctor's office, she had not taken her HCTZ and her BP was elevated.  She was told that she should probably take a different BP med.   Today, she is doing great from a cardiac standpoint.    Current Outpatient Prescriptions on File Prior to Visit  Medication Sig Dispense Refill  . albuterol (PROVENTIL HFA;VENTOLIN HFA) 108 (90 BASE) MCG/ACT inhaler Inhale 2 puffs into the lungs every 6 (six) hours as needed for wheezing or shortness of breath.    . Ascorbic Acid (VITAMIN C PO) Take 2,000 mg by mouth daily.    . clindamycin (CLEOCIN) 300 MG capsule as needed.     . clopidogrel (PLAVIX) 75 MG tablet Take 1 tablet (75 mg total) by mouth daily with breakfast. 30 tablet 0  . diazepam (VALIUM) 5 MG tablet Take 5 mg by mouth every 8 (eight) hours as needed for anxiety. 3 daily    . donepezil (ARICEPT) 10 MG tablet Take 1 tablet (  10 mg total) by mouth daily. 90 tablet 3  . fluticasone (FLONASE) 50 MCG/ACT nasal spray Place 1 spray into the nose 2 (two) times daily.      . hydrochlorothiazide (HYDRODIURIL) 25 MG tablet TAKE 1 TABLET BY MOUTH EVERY DAY 90 tablet 3  . meclizine (ANTIVERT) 25 MG tablet Take 25 mg by mouth 3 (three) times daily as needed for dizziness.    . montelukast (SINGULAIR) 10 MG tablet Take 10 mg by mouth daily.    . nitroGLYCERIN (NITROSTAT) 0.4 MG SL tablet Place 1 tablet (0.4 mg total) under the tongue every 5 (five) minutes x 3 doses as needed for chest pain. 25 tablet 11  . potassium chloride (KLOR-CON 10) 10 MEQ tablet TAKE 2 TABLETS BY MOUTH DAILY 180 tablet 3  . Selenium 200 MCG CAPS Take 1 capsule by mouth every other day.    . sertraline (ZOLOFT) 100 MG tablet Take 50 mg by mouth daily.     . simvastatin (ZOCOR) 20 MG tablet Take 1 tablet (20 mg total) by mouth daily at 6 PM. 30 tablet 0  . triamcinolone cream (KENALOG) 0.1 %     . zinc  gluconate 50 MG tablet Take 50 mg by mouth as needed.     . [DISCONTINUED] potassium chloride (K-DUR,KLOR-CON) 10 MEQ tablet TAKE 2 TABLETS BY MOUTH DAILY 60 tablet 0   No current facility-administered medications on file prior to visit.    Allergies  Allergen Reactions  . Amoxicillin Anaphylaxis  . Penicillins Anaphylaxis  . Tolectin [Tolmetin Sodium] Anaphylaxis    Took at same time as amoxicillin when she had anaphylactic reaction.  . Tolmetin Anaphylaxis  . Ciprofloxacin Rash    Entire body looked as if sunburned, then skin peeled off.  . Ioxaglate Hives    Patient had severe hives after Myelogram.  No issues breathing.  Returned for  a nerve root injection, was given Benadryl, mentioned some minor itching to daughter on the way home but no further issues. Was given a Benadryl today for this injection.   . Other Other (See Comments)  . Prolactin   . Bactrim [Sulfamethoxazole-Trimethoprim] Rash    ? Early rash on palms  . Codeine Nausea And Vomiting  . Contrast Media [Iodinated Diagnostic Agents] Hives    Patient had severe hives after Myelogram.  No issues breathing.  Returned for  a nerve root injection, was given Benadryl, mentioned some minor itching to daughter on the way home but no further issues. Was given a Benadryl today for this injection.   . Imipenem Rash  . Megestrol Rash  . Procardia [Nifedipine] Other (See Comments)    Causes aches down legs  . Vancomycin Rash    Past Medical History  Diagnosis Date  . Hypertension   . Diverticulosis   . Hemorrhoids, internal   . Anxiety   . Arthritis     osteoarthritis  . Vertigo   . 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 Endocarditis 10/18/2010    treated with vancomycin, gentamicin, and Cipro  . Hypokalemia   . Insomnia   . Renal insufficiency 11/29/2010  . CAD (coronary artery disease)     a. [place] 4[place] => LHC: pLAD  40%, RCA with lum irregs => med Rx  . HLD (hyperlipidemia)   . Back problem   . Depression     Past Surgical History  Procedure  Laterality Date  . Cholecystectomy  09/29/1999  . Abdominal hysterectomy  1979    full  . Bladder tuck  2007?  Marland Kitchen Tee without cardioversion  10/10/2010     done with suggestion of small mitral vegetation.  ID was consulted for input and ID feels  patient with possible endocarditis with septic emboli  . Left heart catheterization with coronary angiogram N/A 05/23/2012    Procedure: LEFT HEART CATHETERIZATION WITH CORONARY ANGIOGRAM;  Surgeon: Rollene Rotunda, MD;  Location: [facility] CATH LAB;  Service: Cardiovascular;  Laterality: N/A;    History  Smoking status  . Never Smoker   Smokeless tobacco  . Never Used    History  Alcohol Use No    Family History  Problem Relation Age of Onset  . Coronary artery disease      family history of  . Colon cancer      family history of  . Cerebral palsy Sister   . Colon cancer Brother   . Prostate cancer Brother     Reviw of Systems:  Reviewed in the HPI.  All other systems are negative.  Physical Exam: Blood pressure 136/72, pulse 88, height  (1.626 m), weight 150 lb 12.8 oz (68.402 kg). General: Well developed, well nourished, in no acute distress.  Head: Normocephalic, atraumatic, sclera non-icteric, mucus membranes are moist,   Neck: Supple. Negative for carotid bruits. JVD not elevated.  Lungs: Clear bilaterally to auscultation without wheezes, rales, or rhonchi. Breathing is unlabored.  Heart: RRR with S1 S2. She has a  2/6 systolic murmur  Abdomen: Soft, non-tender, non-distended with normoactive bowel sounds. No hepatomegaly. No rebound/guarding. No obvious abdominal masses.  Msk:  Strength and tone appear normal for age.  Extremities: No clubbing or cyanosis. No edema.  Distal pedal pulses are 2+ and equal bilaterally.  Her leg pulses are especially good. She has some arthritis in her  hands.  Neuro: Alert and oriented X 3. Moves all extremities spontaneously.  Psych:  Responds to questions appropriately with a normal affect.  ECG:  Jan. 8, 2016:  NSR  At 89, biatrial enlargement, inf. MI   Assessment / Plan:

## 2014-02-26 NOTE — Patient Instructions (Signed)
Your physician recommends that you continue on your current medications as directed. Please refer to the Current Medication list given to you today. You may have the other doctor call our office to discuss medication changes with Dr. Elease HashimotoNahser - ask for Eligha BridegroomMichelle Fleurette Woolbright, RN  Your physician wants you to follow-up in: 6 months with Dr. Elease HashimotoNahser. You will receive a reminder letter in the mail two months in advance. If you don't receive a letter, please call our office to schedule the follow-up appointment.

## 2014-02-26 NOTE — Assessment & Plan Note (Signed)
Frances Mendoza is doing well.   She is not having any cardiac problems and I' not sure why she was told that she needed to take a different BP med. i have given Frances Mendoza my card and have asked her to give it to her doctor if he wants to discuss changing her BP meds. I'm not opposed to the idea - just unsure of what he is trying to accomplish. I'm not aware of any side effects .

## 2014-02-27 NOTE — Assessment & Plan Note (Signed)
Her BP is well controlled. Continue same meds

## 2014-05-07 ENCOUNTER — Ambulatory Visit: Payer: Medicare Other | Admitting: Cardiovascular Disease

## 2014-06-10 ENCOUNTER — Other Ambulatory Visit: Payer: Self-pay | Admitting: *Deleted

## 2014-06-10 MED ORDER — HYDROCHLOROTHIAZIDE 25 MG PO TABS: ORAL_TABLET | ORAL | Status: DC

## 2014-06-10 MED ORDER — POTASSIUM CHLORIDE ER 10 MEQ PO TBCR: EXTENDED_RELEASE_TABLET | ORAL | Status: DC

## 2014-10-18 ENCOUNTER — Encounter: Payer: Self-pay | Admitting: Cardiovascular Disease

## 2014-10-18 ENCOUNTER — Ambulatory Visit (INDEPENDENT_AMBULATORY_CARE_PROVIDER_SITE_OTHER): Payer: Medicare Other | Admitting: Cardiovascular Disease

## 2014-10-18 VITALS — BP 116/74 | HR 75 | Ht 62.0 in | Wt 154.0 lb

## 2014-10-18 DIAGNOSIS — I330 Acute and subacute infective endocarditis: Secondary | ICD-10-CM

## 2014-10-18 NOTE — Progress Notes (Signed)
Frances Mendoza Date of Birth  [birth year] [facility]     [place] Office  [address]    [address]   [address] [place], [place]  [address]    [place], [place]  [address] [phone]  Fax  [phone]  [phone]  Fax [phone]   Problem list 1. Endocarditis 2.  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  She was hospitalized in November for CP.  She ruled out for MI.  She complains of lots of pain in the back of both hips radiating down to her legs. She thinks may be related to one of her medications. He typically hurts worse when she stands up and walks around. It does not hurt at night when she is lying in bed.  The pain occurs whenever she stands or walks - not when she is lying down.  She denies any cardiac issues.  May 23, 2012  Frances Mendoza awoke with chest pain and left pain.  The pain lasted for 1-2 hours.  Her son checked her BP and HR which were normal.    The severe pain has resolved but her chest is still not quite normal.    She has had some pains in her chest in the past but this was more severe.  The pain resolved with a valium.  The pains were no pleuretic.  She had dyspnea associated with the pain.  No diaphoresis.  No syncope.  No palpitations.   No fever/ chills/ blood in urine or stool.   She gets some exercise - she has some dyspnea but no angina.    She has occasional episodes of palptations.  She feels like her heart is racing for 5 - 10 minutes.    She takes the valium 2-3 times a day.    She has chronic back pain and has been told that she needs to have surgery.  She does not want to have surgery.    May 07, 2013;  She is doing OK.  She is here alone today.  Has lots of anxiety. She has multiple general complaints.   Jan. 8, 2016:  Frances Mendoza is seen with her daughter today.  She has had continued shortness  of breath . She had an echo at [facility] which is basically unchanged from our echo She feels well When she went to her medical doctor's office, she had not taken her HCTZ and her BP was elevated.  She was told that she should probably take a different BP med.   Today, she is doing great from a cardiac standpoint.    Aug. 29,. 2016:  She is doing well   Current Outpatient Prescriptions on File Prior to Visit  Medication Sig Dispense Refill  . albuterol (PROVENTIL HFA;VENTOLIN HFA) 108 (90 BASE) MCG/ACT inhaler Inhale 2 puffs into the lungs every 6 (six) hours as needed for wheezing or shortness of breath.    . clopidogrel (PLAVIX) 75 MG tablet Take 1 tablet (75 mg total) by mouth daily with breakfast. 30 tablet 0  . diazepam (VALIUM) 5 MG tablet Take 5 mg by mouth every 8 (eight) hours as needed for anxiety. 3 daily    . donepezil (ARICEPT) 10 MG tablet Take 1 tablet (10 mg total) by mouth daily. 90 tablet 3  . fluticasone (FLONASE) 50 MCG/ACT nasal spray  Place 1 spray into the nose 2 (two) times daily.      . hydrochlorothiazide (HYDRODIURIL) 25 MG tablet TAKE 1 TABLET BY MOUTH EVERY DAY 90 tablet 3  . meclizine (ANTIVERT) 25 MG tablet Take 25 mg by mouth 3 (three) times daily as needed for dizziness.    . montelukast (SINGULAIR) 10 MG tablet Take 10 mg by mouth daily.    . nitroGLYCERIN (NITROSTAT) 0.4 MG SL tablet Place 1 tablet (0.4 mg total) under the tongue every 5 (five) minutes x 3 doses as needed for chest pain. 25 tablet 11  . potassium chloride (KLOR-CON 10) 10 MEQ tablet TAKE 2 TABLETS BY MOUTH DAILY 180 tablet 3  . Selenium 200 MCG CAPS Take 1 capsule by mouth every other day.    . sertraline (ZOLOFT) 100 MG tablet Take 50 mg by mouth daily.     . simvastatin (ZOCOR) 20 MG tablet Take 1 tablet (20 mg total) by mouth daily at 6 PM. 30 tablet 0  . clindamycin (CLEOCIN) 300 MG capsule as needed.     Marland Kitchen QUEtiapine (SEROQUEL) 25 MG tablet as needed.    . triamcinolone cream  (KENALOG) 0.1 %     . zinc gluconate 50 MG tablet Take 50 mg by mouth as needed.     . [DISCONTINUED] potassium chloride (K-DUR,KLOR-CON) 10 MEQ tablet TAKE 2 TABLETS BY MOUTH DAILY 60 tablet 0   No current facility-administered medications on file prior to visit.    Allergies  Allergen Reactions  . Amoxicillin Anaphylaxis  . Penicillins Anaphylaxis  . Tolectin [Tolmetin Sodium] Anaphylaxis    Took at same time as amoxicillin when she had anaphylactic reaction.  . Tolmetin Anaphylaxis  . Ciprofloxacin Rash    Entire body looked as if sunburned, then skin peeled off.  . Ioxaglate Hives    Patient had severe hives after Myelogram.  No issues breathing.  Returned for  a nerve root injection, was given Benadryl, mentioned some minor itching to daughter on the way home but no further issues. Was given a Benadryl today for this injection.   . Other Other (See Comments)  . Prolactin   . Bactrim [Sulfamethoxazole-Trimethoprim] Rash    ? Early rash on palms  . Codeine Nausea And Vomiting  . Contrast Media [Iodinated Diagnostic Agents] Hives    Patient had severe hives after Myelogram.  No issues breathing.  Returned for  a nerve root injection, was given Benadryl, mentioned some minor itching to daughter on the way home but no further issues. Was given a Benadryl today for this injection.   . Imipenem Rash  . Megestrol Rash  . Procardia [Nifedipine] Other (See Comments)    Causes aches down legs  . Vancomycin Rash    Past Medical History  Diagnosis Date  . Hypertension   . Diverticulosis   . Hemorrhoids, internal   . Anxiety   . Arthritis     osteoarthritis  . Vertigo   . 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 Endocarditis 10/18/2010    treated with vancomycin, gentamicin, and Cipro  . Hypokalemia   . Insomnia   . Renal insufficiency 11/29/2010  . CAD (coronary artery disease)     a. admx with CP  c/w [place] 4/14 => LHC: pLAD 40%, RCA with lum irregs => med Rx  . HLD (hyperlipidemia)   . Back problem   . Depression  Past Surgical History  Procedure Laterality Date  . Cholecystectomy  09/29/1999  . Abdominal hysterectomy  1979    full  . Bladder tuck  2007?  Marland Kitchen Tee without cardioversion  10/10/2010     done with suggestion of small mitral vegetation.  ID was consulted for input and ID feels  patient with possible endocarditis with septic emboli  . Left heart catheterization with coronary angiogram N/A 05/23/2012    Procedure: LEFT HEART CATHETERIZATION WITH CORONARY ANGIOGRAM;  Surgeon: Rollene Rotunda, MD;  Location: [facility] CATH LAB;  Service: Cardiovascular;  Laterality: N/A;    History  Smoking status  . Never Smoker   Smokeless tobacco  . Never Used    History  Alcohol Use No    Family History  Problem Relation Age of Onset  . Coronary artery disease      family history of  . Colon cancer      family history of  . Cerebral palsy Sister   . Colon cancer Brother   . Prostate cancer Brother     Reviw of Systems:  Reviewed in the HPI.  All other systems are negative.  Physical Exam: Blood pressure 116/74, pulse 75, height 5\' 2"  (1.575 m), weight 69.854 kg (154 lb), SpO2 95 %. General: Well developed, well nourished, in no acute distress.  Head: Normocephalic, atraumatic, sclera non-icteric, mucus membranes are moist,   Neck: Supple. Negative for carotid bruits. JVD not elevated.  Lungs: Clear bilaterally to auscultation without wheezes, rales, or rhonchi. Breathing is unlabored.  Heart: RRR with S1 S2. She has a  2/6 systolic murmur  Abdomen: Soft, non-tender, non-distended with normoactive bowel sounds. No hepatomegaly. No rebound/guarding. No obvious abdominal masses.  Msk:  Strength and tone appear normal for age.  Extremities: No clubbing or cyanosis. No edema.  Distal pedal pulses are 2+ and equal bilaterally.  Her leg pulses are especially good. She has  some arthritis in her hands.  Neuro: Alert and oriented X 3. Moves all extremities spontaneously.  Psych:  Responds to questions appropriately with a normal affect.  ECG:    Assessment / Plan:   1. Endocarditis - doing well.  No symptoms .  Continue to follow .   2.  Septic emboli leading to stroke  3.   TIA -   She is on Plavix for a TIA .   She will likely need to hold the plavix for a week prior to her eyelid surgery .   She should get the official opinion on the plavix from her neurologist.   4. Droopy eye lid.  She wants to have surgery on her left eyelid. She has normal LV function and is at low risk for her cosmetic surgery    Nahser, Deloris Ping, MD  10/18/2014 4:31 PM    [facility] [address] [place],  [address] [place], [place]  [address] Pager [phone] Phone: [phone]; Fax: [phone]   [facility]  [address] [place], [place]  [address] [phone]   Fax [phone]

## 2014-10-18 NOTE — Patient Instructions (Signed)
Medication Instructions:  Your physician recommends that you continue on your current medications as directed. Please refer to the Current Medication list given to you today.  Labwork: No new orders.  Testing/Procedures: No new orders.  Follow-Up: Your physician wants you to follow-up in: 1 YEAR with Dr Nahser.  You will receive a reminder letter in the mail two months in advance. If you don't receive a letter, please call our office to schedule the follow-up appointment.   Any Other Special Instructions Will Be Listed Below (If Applicable).   

## 2014-12-15 NOTE — Telephone Encounter (Signed)
Error

## 2015-01-24 ENCOUNTER — Other Ambulatory Visit: Payer: Self-pay | Admitting: Neurology

## 2015-01-25 ENCOUNTER — Other Ambulatory Visit: Payer: Self-pay

## 2015-01-31 ENCOUNTER — Telehealth: Payer: Self-pay | Admitting: Neurology

## 2015-01-31 MED ORDER — DONEPEZIL HCL 10 MG PO TABS: 10.0000 mg | ORAL_TABLET | Freq: Every day | ORAL | Status: DC

## 2015-01-31 NOTE — Telephone Encounter (Signed)
Spouse called to request refill of donepezil (ARICEPT) 10 MG tablet, states they wouldn't fill the medication until wife makes appointment, wife scheduled for next available 03/21/15, spouse states "she gets a 90 day supply of this medication and has been out of this medication for the past couple or 3 days".

## 2015-01-31 NOTE — Telephone Encounter (Signed)
90 day rx sent to the pharmacy. 

## 2015-03-21 ENCOUNTER — Encounter: Payer: Self-pay | Admitting: Neurology

## 2015-03-21 ENCOUNTER — Ambulatory Visit (INDEPENDENT_AMBULATORY_CARE_PROVIDER_SITE_OTHER): Payer: Medicare Other | Admitting: Neurology

## 2015-03-21 VITALS — BP 132/74 | HR 68 | Ht 62.0 in | Wt 158.0 lb

## 2015-03-21 DIAGNOSIS — R413 Other amnesia: Secondary | ICD-10-CM | POA: Diagnosis not present

## 2015-03-21 MED ORDER — MEMANTINE HCL 10 MG PO TABS: 10.0000 mg | ORAL_TABLET | Freq: Two times a day (BID) | ORAL | Status: DC

## 2015-03-21 MED ORDER — DONEPEZIL HCL 10 MG PO TABS: 10.0000 mg | ORAL_TABLET | Freq: Every day | ORAL | Status: DC

## 2015-03-21 NOTE — Progress Notes (Signed)
PATIENT: Frances Mendoza DOB: 1935/11/12  Chief Complaint  Patient presents with  . Memory Loss    MMSE - animals.  She is here with her daughter, Frances Mendoza.  Feels memory has declined over the last year.     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, presented with confusion, delirium, gait difficulty, TEE showed ejection fraction 65%, calcified mitral valve, no vegetation, was diagnosed with a septic endocarditis, was treated with prolonged antibiotics, antiplatelet agent, also with non-ST elevation MI, she also had past medical history of hypertension, anxiety, hyperlipidemia, she had a high school graduate, lives at home with her husband,  Ever since that event she has become much less active, only driving occasionally at short distance, also has short-term memory trouble, occasionally confusion, no longer cooking, she also complains of chronic low back pain, long-standing history of depression anxiety, she is taking Valium 5 mg 3 times a day, her husband has taken over her prescription medication management, but she tends to take a lot of supplements, as needed Tylenol, ibuprofen,  In May 20 fourth 2014, she woke up was noticed by her family to have confusion, unsteady gait, lasting for 24 hours, no seizure activity was noted, she was admitted to the hospital, May 24th through 27th, 2014.  MRI head again showed acute infarction: Two or three punctate acute infarctions in the left frontal region affecting the caudate head and left frontal lobe.  No swelling, hemorrhage or mass effect.  Moderate chronic small vessel changes elsewhere throughout the brain.    MRA HEAD: No major vessel occlusion or correctable proximal stenosis. Repeat echocardiogram showed ejection fraction 65%,  calcified mitral valve, no vegetations, EEG was normal, she is not back to her normal self, very argumentary, especially to her daughter. Ultrasound of carotid artery showed no large vessel disease  Mother died age 3 from stroke, father died age 52 from heart attack, no family history of dementia  UPDATE Sept 28th 2015: Her husband is helping with her medications, she is alone at visit, she is overall doing well, thinking she is improving. She works in her garden a lot, still active at home, cooking Sunday lunch for her extended family.  She has no trouble walking, she fell sometimes when turning quickly  UPDATE Mar 21 2015: She is with her daughter at visit, she had bad cold in Dec 2016, take few weeks to recover, she complains of gradual worsening memory loss, but she still very active, she still drives, no gait difficulties, she has chronic insomnia, taking Valium 5 mg half to one tablets every night as needed  REVIEW OF SYSTEMS: Full 14 system review of systems performed and notable only for excessive sweaty, ear pain, ringing ears, insomnia, achy muscles, walking difficulty, muscle cramps, neck pain, depression, confusion, anxiety  ALLERGIES: Allergies  Allergen Reactions  . Amoxicillin Anaphylaxis  . Penicillins Anaphylaxis  . Tolectin [Tolmetin Sodium] Anaphylaxis    Took at same time as amoxicillin when she had anaphylactic reaction.  . Tolmetin Anaphylaxis  . Ciprofloxacin Rash    Entire body looked as if sunburned, then skin peeled off.  . Ioxaglate Hives    Patient had severe hives after Myelogram.  No issues breathing.  Returned for  a nerve root injection, was given Benadryl, mentioned  some minor itching to daughter on the way home but no further issues. Was given a Benadryl today for this injection.   . Other Other (See Comments)  . Prolactin   . Bactrim [Sulfamethoxazole-Trimethoprim] Rash    ? Early rash on palms  . Codeine Nausea And Vomiting  . Contrast Media  [Iodinated Diagnostic Agents] Hives    Patient had severe hives after Myelogram.  No issues breathing.  Returned for  a nerve root injection, was given Benadryl, mentioned some minor itching to daughter on the way home but no further issues. Was given a Benadryl today for this injection.   . Imipenem Rash  . Megestrol Rash  . Procardia [Nifedipine] Other (See Comments)    Causes aches down legs  . Vancomycin Rash    HOME MEDICATIONS: Current Outpatient Prescriptions  Medication Sig Dispense Refill  . albuterol (PROVENTIL HFA;VENTOLIN HFA) 108 (90 BASE) MCG/ACT inhaler Inhale 2 puffs into the lungs every 6 (six) hours as needed for wheezing or shortness of breath.    . clopidogrel (PLAVIX) 75 MG tablet Take 1 tablet (75 mg total) by mouth daily with breakfast. 30 tablet 0  . diazepam (VALIUM) 5 MG tablet Take 5 mg by mouth every 8 (eight) hours as needed for anxiety. 3 daily    . donepezil (ARICEPT) 10 MG tablet Take 1 tablet (10 mg total) by mouth daily. 90 tablet 0  . fluticasone (FLONASE) 50 MCG/ACT nasal spray Place 1 spray into the nose 2 (two) times daily.      . hydrochlorothiazide (HYDRODIURIL) 25 MG tablet TAKE 1 TABLET BY MOUTH EVERY DAY 90 tablet 3  . meclizine (ANTIVERT) 25 MG tablet Take 25 mg by mouth 3 (three) times daily as needed for dizziness.    . nitroGLYCERIN (NITROSTAT) 0.4 MG SL tablet Place 1 tablet (0.4 mg total) under the tongue every 5 (five) minutes x 3 doses as needed for chest pain. 25 tablet 11  . potassium chloride (KLOR-CON 10) 10 MEQ tablet TAKE 2 TABLETS BY MOUTH DAILY 180 tablet 3  . QUEtiapine (SEROQUEL) 25 MG tablet as needed.    . sertraline (ZOLOFT) 100 MG tablet Take 50 mg by mouth daily.     . [DISCONTINUED] potassium chloride (K-DUR,KLOR-CON) 10 MEQ tablet TAKE 2 TABLETS BY MOUTH DAILY 60 tablet 0   No current facility-administered medications for this visit.    PAST MEDICAL HISTORY: Past Medical History  Diagnosis Date  . Hypertension   .  Diverticulosis   . Hemorrhoids, internal   . Anxiety   . Arthritis     osteoarthritis  . Vertigo   . Cerebrovascular accident, embolic (HCC) 10/18/2010    Bilateral  . Septic embolism (HCC) 10/18/2010    endocarditis -- per TEE  . Endocarditis 10/18/2010    treated with vancomycin, gentamicin, and Cipro  . Hypokalemia   . Insomnia   . Renal insufficiency 11/29/2010  . CAD (coronary artery disease)     a. admx with CP c/w [place] 4/14 => LHC: pLAD 40%, RCA with lum irregs => med Rx  . HLD (hyperlipidemia)   . Back problem   . Depression     PAST SURGICAL HISTORY: Past Surgical History  Procedure Laterality Date  . Cholecystectomy  09/29/1999  . Abdominal hysterectomy  1979    full  . Bladder tuck  2007?  Marland Kitchen Tee without cardioversion  10/10/2010     done with suggestion of small mitral vegetation.  ID was consulted for input and ID  feels  patient with possible endocarditis with septic emboli  . Left heart catheterization with coronary angiogram N/A 05/23/2012    Procedure: LEFT HEART CATHETERIZATION WITH CORONARY ANGIOGRAM;  Surgeon: Rollene Rotunda, MD;  Location: [facility] CATH LAB;  Service: Cardiovascular;  Laterality: N/A;    FAMILY HISTORY: Family History  Problem Relation Age of Onset  . Coronary artery disease      family history of  . Colon cancer      family history of  . Cerebral palsy Sister   . Colon cancer Brother   . Prostate cancer Brother     SOCIAL HISTORY:  Social History   Social History  . Marital Status: Married    Spouse Name: Dimas Aguas  . Number of Children: 3  . Years of Education: 12   Occupational History  .      patient not employed  . Retired    Social History Main Topics  . Smoking status: Never Smoker   . Smokeless tobacco: Never Used  . Alcohol Use: No  . Drug Use: No  . Sexual Activity: Not Currently   Other Topics Concern  . Not on file   Social History Narrative   Patient lives at home with spouse. Howard   Caffeine Use: 2 cups  daily   Patient has 3 children.    Patient is retired.            PHYSICAL EXAM   Filed Vitals:   03/21/15 1405  Height:  (1.575 m)  Weight: 158 lb (71.668 kg)    Not recorded      Body mass index is 28.89 kg/(m^2).  PHYSICAL EXAMNIATION:  Gen: NAD, conversant, well nourised, obese, well groomed                     Cardiovascular: Regular rate rhythm, no peripheral edema, warm, nontender. Eyes: Conjunctivae clear without exudates or hemorrhage Neck: Supple, no carotid bruise. Pulmonary: Clear to auscultation bilaterally   NEUROLOGICAL EXAM:  MENTAL STATUS: Speech:    Speech is normal; fluent and spontaneous with normal comprehension.  Cognition: Mini-Mental Status Examination is 29 out of 30     Orientation to time, place and person     Recent and remote memory: She missed one out of 3 recalls     Normal Attention span and concentration     Normal Language, naming, repeating,spontaneous speech     Fund of knowledge   CRANIAL NERVES: CN II: Visual fields are full to confrontation. Fundoscopic exam is normal with sharp discs and no vascular changes. Pupils are round equal and briskly reactive to light. CN III, IV, VI: extraocular movement are normal. No ptosis. CN V: Facial sensation is intact to pinprick in all 3 divisions bilaterally. Corneal responses are intact.  CN VII: Face is symmetric with normal eye closure and smile. CN VIII: Hearing is normal to rubbing fingers CN IX, X: Palate elevates symmetrically. Phonation is normal. CN XI: Head turning and shoulder shrug are intact CN XII: Tongue is midline with normal movements and no atrophy.  MOTOR: There is no pronator drift of out-stretched arms. Muscle bulk and tone are normal. Muscle strength is normal.  REFLEXES: Reflexes are 2+ and symmetric at the biceps, triceps, knees, and ankles. Plantar responses are flexor.  SENSORY: Intact to light touch, pinprick, position sense, and vibration sense are  intact in fingers and toes.  COORDINATION: Rapid alternating movements and fine finger movements are intact. There is no dysmetria  on finger-to-nose and heel-knee-shin.    GAIT/STANCE: Posture is normal. Gait is steady with normal steps, base, arm swing, and turning.    DIAGNOSTIC DATA (LABS, IMAGING, TESTING) - I reviewed patient records, labs, notes, testing and imaging myself where available.   ASSESSMENT AND PLAN  DOLORAS TELLADO is a 80 y.o. female   Mild cognitive impairment, slow worsening  Mini-Mental Status Examination is 26 out of 30  Continue Aricept 10 mg daily  History of embolic stroke  Keep Plavix daily  Continue moderate exercise, address vascular risk factors   Levert Feinstein, M.D. Ph.D.  [facility] [address] [place], [place] [address] Ph: [phone] Fax: [phone]  CC: Referring Provider

## 2015-06-13 ENCOUNTER — Other Ambulatory Visit: Payer: Self-pay

## 2015-06-13 MED ORDER — HYDROCHLOROTHIAZIDE 25 MG PO TABS: ORAL_TABLET | ORAL | Status: DC

## 2015-07-01 ENCOUNTER — Other Ambulatory Visit: Payer: Self-pay | Admitting: Otolaryngology

## 2015-07-01 DIAGNOSIS — H903 Sensorineural hearing loss, bilateral: Secondary | ICD-10-CM

## 2015-07-11 ENCOUNTER — Ambulatory Visit
Admission: RE | Admit: 2015-07-11 | Discharge: 2015-07-11 | Disposition: A | Discharge status: Home / Self Care | Payer: Medicare Other | Source: Ambulatory Visit | Attending: Otolaryngology | Admitting: Otolaryngology

## 2015-07-11 ENCOUNTER — Other Ambulatory Visit: Payer: Medicare Other

## 2015-07-11 DIAGNOSIS — H903 Sensorineural hearing loss, bilateral: Secondary | ICD-10-CM

## 2015-07-11 MED ORDER — GADOBENATE DIMEGLUMINE 529 MG/ML IV SOLN
15.0000 mL | Freq: Once | INTRAVENOUS | Status: AC | PRN
Start: 2015-07-11 — End: 2015-07-11
  Administered 2015-07-11: 15 mL via INTRAVENOUS

## 2015-08-31 ENCOUNTER — Ambulatory Visit: Payer: Medicare Other | Attending: Otolaryngology | Admitting: Physical Therapy

## 2015-08-31 ENCOUNTER — Encounter: Payer: Self-pay | Admitting: Physical Therapy

## 2015-08-31 DIAGNOSIS — R2689 Other abnormalities of gait and mobility: Secondary | ICD-10-CM

## 2015-08-31 DIAGNOSIS — R42 Dizziness and giddiness: Secondary | ICD-10-CM | POA: Diagnosis not present

## 2015-08-31 DIAGNOSIS — R2681 Unsteadiness on feet: Secondary | ICD-10-CM

## 2015-08-31 DIAGNOSIS — H8111 Benign paroxysmal vertigo, right ear: Secondary | ICD-10-CM | POA: Diagnosis present

## 2015-08-31 NOTE — Therapy (Signed)
[facility] [address] [place], [place], [address] Phone: [phone]   Fax:  [phone]  Physical Therapy Evaluation  Patient Details  Name: Frances Mendoza MRN: [id number] Date of Birth: [birth year] Referring Provider: Ermalinda Barrios, MD  Encounter Date: 08/31/2015      PT End of Session - 08/31/15 1207    Visit Number 1   Number of Visits 1   Date for PT Re-Evaluation --   Authorization Type MCR Traditional primary; AARP secondary   Authorization Time Period G Codes required   PT Start Time 1105   PT Stop Time 1159   PT Time Calculation (min) 54 min   Equipment Utilized During Treatment Gait belt   Activity Tolerance Patient tolerated treatment well   Behavior During Therapy WFL for tasks assessed/performed      Past Medical History  Diagnosis Date  . Hypertension   . Diverticulosis   . Hemorrhoids, internal   . Anxiety   . Arthritis     osteoarthritis  . Vertigo   . Cerebrovascular accident, embolic (HCC) 10/18/2010    Bilateral  . Septic embolism (HCC) 10/18/2010    endocarditis -- per TEE  . Endocarditis 10/18/2010    treated with vancomycin, gentamicin, and Cipro  . Hypokalemia   . Insomnia   . Renal insufficiency 11/29/2010  . CAD (coronary artery disease)     a. admx with CP c/w [place] 4/14 => LHC: pLAD 40%, RCA with lum irregs => med Rx  . HLD (hyperlipidemia)   . Back problem   . Depression   . Allergy     Past Surgical History  Procedure Laterality Date  . Cholecystectomy  09/29/1999  . Abdominal hysterectomy  1979    full  . Bladder tuck  2007?  Marland Kitchen Tee without cardioversion  10/10/2010     done with suggestion of small mitral vegetation.  ID was consulted for input and ID feels  patient with possible endocarditis with septic emboli  . Left heart catheterization with coronary angiogram N/A 05/23/2012    Procedure: LEFT HEART CATHETERIZATION WITH CORONARY ANGIOGRAM;  Surgeon: Rollene Rotunda,  MD;  Location: [facility] CATH LAB;  Service: Cardiovascular;  Laterality: N/A;    There were no vitals filed for this visit.       Subjective Assessment - 08/31/15 1111    Subjective "Well, I just get dizzy and my ears are ringing, and I fall a lot but I've been falling since I had the stroke." Pt reports L tinnitis; unsure when this started, but "it might have been when I came home after the stroke (2012)." Daughter (present) feels as though pt falls due to "doing things she shouldn't be doing, like dragging pieces of wood across her yard."     Patient is accompained by: Family member  daughter, [name]   Pertinent History PMH significant for: CVA (2012) with residual dysphagia and R hemiparesis, bilateral hearing loss, endocarditis, HTN, HLD, CAD, lower back pain (being address by other provider), depression   Diagnostic tests Recent MRI of brain: Remote L thalamic infarct; small infarcts in B simoovale/corona radiata (unchanged since 2012); chornic white matter changes.   Patient Stated Goals "He thought maybe I could my legs stronger."   Currently in Pain? No/denies  Pt reports occasional pain in hips with bending over            [facility] PT Assessment - 08/31/15 0001    Assessment   Medical Diagnosis dizziness and giddiness  Referring Provider Ermalinda Barrios, MD   Onset Date/Surgical Date 02/19/10   Precautions   Precautions Fall   Restrictions   Weight Bearing Restrictions No   Balance Screen   Has the patient fallen in the past 6 months Yes   How many times? > 10   Has the patient had a decrease in activity level because of a fear of falling?  No   Is the patient reluctant to leave their home because of a fear of falling?  No   Home Environment   Living Environment Private residence   Living Arrangements Spouse/significant other   Type of Home House   Home Access Stairs to enter   Entrance Stairs-Number of Steps 4   Entrance Stairs-Rails Right   Home Layout One level  one step  up into other bedrooms   Home Equipment [place] - single point   Additional Comments Pt has fallen negotiating single step to access bedrooms.   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   Coordination   Gross Motor Movements are Fluid and Coordinated Yes   ROM / Strength   AROM / PROM / Strength Strength   Strength   Overall Strength Deficits   Overall Strength Comments R hip flexion 4-/5, hip extension 4-/5 on R, 4/5 on L; hip ABD 4-/5 on L, 3-/5 on R.   Transfers   Transfers Sit to Stand;Stand to Sit   Sit to Stand 6: Modified independent (Device/Increase time)   Stand to Sit 6: Modified independent (Device/Increase time)   Comments Able to stand from standard chair without use of UE's.   Ambulation/Gait   Ambulation/Gait Yes   Ambulation/Gait Assistance 4: Min guard;4: Min assist   Ambulation/Gait Assistance Details Min A required to recover from significant anterior LOB due to R toe catch during gait.   Ambulation Distance (Feet) 120 Feet   Assistive device None   Gait Pattern Step-through pattern;Decreased dorsiflexion - right;Decreased hip/knee flexion - right;Right foot flat   Ambulation Surface Level;Indoor   Gait velocity 3.07 ft/sec            Vestibular Assessment - 08/31/15 0001    Vestibular Assessment   General Observation "Most of the time I feel dizzy right in the  middle of the day when I'm doing too much".   Symptom Behavior   Type of Dizziness "Funny feeling in head"  and imbalance   Frequency of Dizziness daily   Aggravating Factors Turning head quickly;Rolling to left;Turning body quickly;Rolling to right  symptoms with turning to L > R   Relieving Factors No known relieving factors   Occulomotor Exam   Occulomotor Alignment Normal  Decreased eye opening bilaterally   Spontaneous Absent   Gaze-induced Absent  difficult to see due to limited eye opening   Smooth Pursuits Saccades   Comment (+) and symptomatic R Head Thrust Test   Vestibulo-Occular Reflex   Comment --   Positional Testing  Sidelying Test Sidelying Right;Sidelying Left   Horizontal Canal Testing Horizontal Canal Right;Horizontal Canal Left;Horizontal Canal Right Intensity;Horizontal Canal Left Intensity   Sidelying Right   Sidelying Right Duration NA   Sidelying Right Symptoms No nystagmus   Sidelying Left   Sidelying Left Duration NA   Sidelying Left Symptoms No nystagmus   Horizontal Canal Right   Horizontal Canal Right Duration Pt reports "feeling funny", but no nystagmus noted   Horizontal Canal Right Symptoms Normal   Horizontal Canal Left   Horizontal Canal Left Duration With fast rolling, noted ageotropic nystagmus x10 seconds accompanied by disequilibrium   Horizontal Canal Left Symptoms Ageotrophic;Nystagmus;Other (comment)                Vestibular Treatment/Exercise - 2015-09-24 0001    Vestibular Treatment/Exercise   Vestibular Treatment Provided Habituation   Habituation Exercises Horizontal Roll   Horizontal Roll   Number of Reps  3   Symptom Description  fast rolling x3 with concurrent PT cueing for technique, duration of each position               PT Education - Sep 24, 2015 1220    Education provided Yes   Education Details Pt eval findings, goals, and POC. Discussed transitioning care to local health system to decrease burden of  travel to this clinic. Initiated HEP for habituation; see Pt Instructions.  Recomending pt use cane for all mobility to decrease fall risk.    Person(s) Educated Patient   Methods Explanation;Demonstration;Handout;Verbal cues   Comprehension Verbalized understanding;Returned demonstration                    Plan - 09-24-2015 1230    Clinical Impression Statement Pt is a 80 y/o F referred to vestibular rehab to address functional impairments associated with dizziness and chronic unsteadiness. PMH significant for: CVA (2012) with residual dysphagia and R hemiparesis, bilateral hearing loss, endocarditis, HTN, HLD, CAD, lower back pain, depression. PT evaluation reveals the following: gait instability, B hip weakness (R > L) increased dizziness/disequilibrium with horizontal head turns and rolling, saccadic smooth pursuits, (+) and symptomatic R Head Thrust Test, and ageotropic nystagmus supine rolling ot L side. Based on findings, unable to rule out motion sensitivity,  impaired visual-vestibular integration, R-sided vestibular hypofunction, and R horizontal canal cupulolithiasis. Educated pt on fast rolling for habituation.  Pt/daughter requesting to receive PT services closer to pt's home. Therefore, made pt/daughter aware of vestibular PT in patient's home town. Pt/daughter in full agreement.    PT Frequency One time visit   Consulted and Agree with Plan of Care Patient;Family member/caregiver   Family Member Consulted daughter, [name]      Patient will benefit from skilled therapeutic intervention in order to improve the following deficits and impairments:     Visit Diagnosis: Dizziness and giddiness - Plan: PT plan of care cert/re-cert  BPPV (benign paroxysmal positional vertigo), right - Plan: PT plan of care cert/re-cert  Other abnormalities of gait and mobility - Plan: PT plan of care cert/re-cert  Unsteadiness on feet - Plan: PT plan of care cert/re-cert      G-Codes -  24-Sep-2015 1654    Functional Assessment Tool Used DHI = 32   Functional Limitation Self care   Self Care Current Status (G9562) At least 20 percent but less than 40 percent impaired, limited or restricted   Self Care Goal Status (Z3086) At least 20 percent but less than 40 percent impaired, limited or restricted   Self Care  Discharge Status [phone](G8989) At least 20 percent but less than 40 percent impaired, limited or restricted       Problem List Patient Active Problem List   Diagnosis Date Noted  . Bacterial endocarditis 10/18/2014  . Memory loss 07/31/2012  . Other and unspecified hyperlipidemia 07/15/2012  . Acute encephalopathy 07/12/2012  . Hypertension 07/12/2012  . Cellulitis 11/12/2011  . Oral lesion 11/12/2011  . Drug rash 11/29/2010  . Itching 11/29/2010  . Yeast infection involving the vagina and surrounding area 11/29/2010  . Drug rash 11/29/2010  . Hypokalemia 11/29/2010  . Renal insufficiency 11/29/2010  . CVA (cerebral infarction) 11/21/2010  . Mitral regurgitation 10/18/2010  . Coronary artery disease 10/18/2010  . SIRS (systemic inflammatory response syndrome) (HCC) 10/18/2010  . Dysphagia III 10/18/2010    Jorje GuildBlair Hilja Kintzel, PT, DPT [facility] [address] [place], [place], [address] Phone: [phone]   Fax:  [phone] 08/31/2015, 5:02 PM  Name: [name] JulyBetty C Hiltunen MRN: [id number] Date of Birth: 10/31/1935

## 2015-08-31 NOTE — Patient Instructions (Signed)
Tip Card 1.The goal of habituation training is to assist in decreasing symptoms of vertigo, dizziness, or nausea provoked by specific head and body motions. 2.These exercises may initially increase symptoms; however, be persistent and work through symptoms. With repetition and time, the exercises will assist in reducing or eliminating symptoms. 3.Exercises should be stopped and discussed with the therapist if you experience any of the following: - Sudden change or fluctuation in hearing - New onset of ringing in the ears, or increase in current intensity - Any fluid discharge from the ear - Severe pain in neck or back - Extreme nausea  Copyright  VHI. All rights reserved.  Rolling   With pillow under head, start on back. Roll quickly to your right side.  Hold until dizziness stops, plus 20 seconds and then roll quickly to the left side.  Hold until dizziness stops, plus 20 seconds.  Repeat sequence 5 times per session. Do 2 sessions per day.     

## 2015-09-09 ENCOUNTER — Ambulatory Visit: Payer: Medicare Other | Admitting: Physical Therapy

## 2015-10-06 ENCOUNTER — Encounter: Payer: Self-pay | Admitting: Cardiovascular Disease

## 2015-11-03 ENCOUNTER — Ambulatory Visit: Payer: Medicare Other | Admitting: Cardiovascular Disease

## 2015-11-10 ENCOUNTER — Encounter: Payer: Self-pay | Admitting: Cardiovascular Disease

## 2015-11-28 ENCOUNTER — Ambulatory Visit (INDEPENDENT_AMBULATORY_CARE_PROVIDER_SITE_OTHER): Payer: Medicare Other | Admitting: Cardiovascular Disease

## 2015-11-28 ENCOUNTER — Encounter (INDEPENDENT_AMBULATORY_CARE_PROVIDER_SITE_OTHER): Payer: Self-pay

## 2015-11-28 ENCOUNTER — Encounter: Payer: Self-pay | Admitting: Cardiovascular Disease

## 2015-11-28 VITALS — BP 114/60 | HR 76 | Ht 62.0 in | Wt 158.0 lb

## 2015-11-28 DIAGNOSIS — I38 Endocarditis, valve unspecified: Secondary | ICD-10-CM

## 2015-11-28 DIAGNOSIS — Z23 Encounter for immunization: Secondary | ICD-10-CM | POA: Diagnosis not present

## 2015-11-28 NOTE — Patient Instructions (Addendum)
Medication Instructions:  Your physician recommends that you continue on your current medications as directed. Please refer to the Current Medication list given to you today.   Labwork: None Ordered   Testing/Procedures: Your physician has requested that you have an echocardiogram in 1 year before your appointment with Dr. Elease HashimotoNahser. Echocardiography is a painless test that uses sound waves to create images of your heart. It provides your doctor with information about the size and shape of your heart and how well your heart's chambers and valves are working. This procedure takes approximately one hour. There are no restrictions for this procedure.   Follow-Up: Your physician wants you to follow-up in: 1 year with Dr. Elease HashimotoNahser.  You will receive a reminder letter in the mail two months in advance. If you don't receive a letter, please call our office to schedule the follow-up appointment.   If you need a refill on your cardiac medications before your next appointment, please call your pharmacy.   Thank you for choosing [facility]! Eligha BridegroomMichelle Swinyer, RN [phone]

## 2015-11-28 NOTE — Progress Notes (Signed)
[name] July Date of Birth  1935/06/13 [facility]     [place] Office  [address]    [address]   [address] [place], [place]  [address]    [place], [place]  [address] [phone]  Fax  [phone]  [phone]  Fax [phone]   Problem list 1. Endocarditis 2.  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  She was hospitalized in November for CP.  She ruled out for MI.  She complains of lots of pain in the back of both hips radiating down to her legs. She thinks may be related to one of her medications. He typically hurts worse when she stands up and walks around. It does not hurt at night when she is lying in bed.  The pain occurs whenever she stands or walks - not when she is lying down.  She denies any cardiac issues.  May 23, 2012  Frances Mendoza awoke with chest pain and left pain.  The pain lasted for 1-2 hours.  Her son checked her BP and HR which were normal.    The severe pain has resolved but her chest is still not quite normal.    She has had some pains in her chest in the past but this was more severe.  The pain resolved with a valium.  The pains were no pleuretic.  She had dyspnea associated with the pain.  No diaphoresis.  No syncope.  No palpitations.   No fever/ chills/ blood in urine or stool.   She gets some exercise - she has some dyspnea but no angina.    She has occasional episodes of palptations.  She feels like her heart is racing for 5 - 10 minutes.    She takes the valium 2-3 times a day.    She has chronic back pain and has been told that she needs to have surgery.  She does not want to have surgery.    May 07, 2013;  She is doing OK.  She is here alone today.  Has lots of anxiety. She has multiple general complaints.   Jan. 8, 2016:  Frances Mendoza is seen with her daughter today.  She has had continued shortness  of breath . She had an echo at [facility] which is basically unchanged from our echo She feels well When she went to her medical doctor's office, she had not taken her HCTZ and her BP was elevated.  She was told that she should probably take a different BP med.   Today, she is doing great from a cardiac standpoint.    Aug. 29,. 2016:  She is doing well   Oct. 9, 2017  Frances Mendoza is seen today for follow up of her endocarditis     Current Outpatient Prescriptions on File Prior to Visit  Medication Sig Dispense Refill  . albuterol (PROVENTIL HFA;VENTOLIN HFA) 108 (90 BASE) MCG/ACT inhaler Inhale 2 puffs into the lungs every 6 (six) hours as needed for wheezing or shortness of breath.    . clopidogrel (PLAVIX) 75 MG tablet Take 1 tablet (75 mg total) by mouth daily with breakfast. 30 tablet 0  . diazepam (VALIUM) 5 MG tablet Take 5 mg by mouth every 8 (eight) hours as needed for anxiety. 3 daily    . donepezil (ARICEPT) 10 MG tablet Take 1  tablet (10 mg total) by mouth daily. 90 tablet 3  . fluticasone (FLONASE) 50 MCG/ACT nasal spray Place 1 spray into the nose 2 (two) times daily.      . hydrochlorothiazide (HYDRODIURIL) 25 MG tablet TAKE 1 TABLET BY MOUTH EVERY DAY 90 tablet 1  . meclizine (ANTIVERT) 25 MG tablet Take 25 mg by mouth 3 (three) times daily as needed for dizziness.    . memantine (NAMENDA) 10 MG tablet Take 1 tablet (10 mg total) by mouth 2 (two) times daily. 60 tablet 11  . nitroGLYCERIN (NITROSTAT) 0.4 MG SL tablet Place 1 tablet (0.4 mg total) under the tongue every 5 (five) minutes x 3 doses as needed for chest pain. 25 tablet 11  . potassium chloride (KLOR-CON 10) 10 MEQ tablet TAKE 2 TABLETS BY MOUTH DAILY 180 tablet 3  . sertraline (ZOLOFT) 100 MG tablet Take 50 mg by mouth daily.     . [DISCONTINUED] potassium chloride (K-DUR,KLOR-CON) 10 MEQ tablet TAKE 2 TABLETS BY MOUTH DAILY 60 tablet 0   No current facility-administered medications on file prior to visit.      Allergies  Allergen Reactions  . Amoxicillin Anaphylaxis  . Penicillins Anaphylaxis  . Tolectin [Tolmetin Sodium] Anaphylaxis    Took at same time as amoxicillin when she had anaphylactic reaction.  . Tolmetin Anaphylaxis  . Ciprofloxacin Rash    Entire body looked as if sunburned, then skin peeled off.  . Statins Other (See Comments)    Leg pain  . Ioxaglate Hives    Patient had severe hives after Myelogram.  No issues breathing.  Returned for  a nerve root injection, was given Benadryl, mentioned some minor itching to daughter on the way home but no further issues. Was given a Benadryl today for this injection.   . Other Other (See Comments)  . Prolactin   . Bactrim [Sulfamethoxazole-Trimethoprim] Rash    ? Early rash on palms  . Codeine Nausea And Vomiting  . Contrast Media [Iodinated Diagnostic Agents] Hives    Patient had severe hives after Myelogram.  No issues breathing.  Returned for  a nerve root injection, was given Benadryl, mentioned some minor itching to daughter on the way home but no further issues. Was given a Benadryl today for this injection.   . Imipenem Rash  . Megestrol Rash  . Procardia [Nifedipine] Other (See Comments)    Causes aches down legs  . Vancomycin Rash    Past Medical History:  Diagnosis Date  . Allergy   . Anxiety   . Arthritis    osteoarthritis  . Back problem   . CAD (coronary artery disease)    a. admx with CP c/w [place] 4/14 => LHC: pLAD 40%, RCA with lum irregs => med Rx  . 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 Depression   . Diverticulosis   . Endocarditis 10/18/2010   treated with vancomycin, gentamicin, and Cipro  . Hemorrhoids, internal   . HLD (hyperlipidemia)   . Hypertension   . Hypokalemia   . Insomnia   . Renal insufficiency 11/29/2010  . Septic embolism (HCC) 10/18/2010   endocarditis -- per TEE  . Vertigo     Past Surgical History:  Procedure Laterality Date  . ABDOMINAL  HYSTERECTOMY  1979   full  . bladder tuck  2007?  . CHOLECYSTECTOMY  09/29/1999  . LEFT HEART CATHETERIZATION WITH CORONARY ANGIOGRAM N/A 05/23/2012   Procedure: LEFT HEART CATHETERIZATION WITH CORONARY ANGIOGRAM;  Surgeon: Fayrene Fearing  Hochrein, MD;  Location: [facility] CATH LAB;  Service: Cardiovascular;  Laterality: N/A;  . TEE WITHOUT CARDIOVERSION  10/10/2010    done with suggestion of small mitral vegetation.  ID was consulted for input and ID feels  patient with possible endocarditis with septic emboli    History  Smoking Status  . Never Smoker  Smokeless Tobacco  . Never Used    History  Alcohol Use No    Family History  Problem Relation Age of Onset  . Coronary artery disease      family history of  . Colon cancer      family history of  . Cerebral palsy Sister   . Colon cancer Brother   . Prostate cancer Brother     Reviw of Systems:  Reviewed in the HPI.  All other systems are negative.  Physical Exam: Blood pressure 114/60, pulse 76, height 5\' 2"  (1.575 m), weight 158 lb (71.7 kg). General: Well developed, well nourished, in no acute distress.  Head: Normocephalic, atraumatic, sclera non-icteric, mucus membranes are moist,   Neck: Supple. Negative for carotid bruits. JVD not elevated.  Lungs: Clear bilaterally to auscultation without wheezes, rales, or rhonchi. Breathing is unlabored.  Heart: RRR with S1 S2. She has a  2/6 systolic murmur  Abdomen: Soft, non-tender, non-distended with normoactive bowel sounds. No hepatomegaly. No rebound/guarding. No obvious abdominal masses.  Msk:  Strength and tone appear normal for age.  Extremities: No clubbing or cyanosis. No edema.  Distal pedal pulses are 2+ and equal bilaterally.  Her leg pulses are especially good. She has some arthritis in her hands.  Neuro: Alert and oriented X 3. Moves all extremities spontaneously.  Psych:  Responds to questions appropriately with a normal affect.  ECG: NSR at 76.   LAE.  INf. MI -  old Anterior MI old    Assessment / Plan:   1. Endocarditis - doing well.  No symptoms .  Continue to follow .  Will get an echo in 1 year prior to her next visit   2.  Septic emboli leading to stroke  3.   TIA -   She was on Plavix for a TIA .     She cut her Plavix in half  - I have recommended that she increase the plavix back up to 75 mg a day  - hx of TIAs   4. Essential HTN:   BP is well controlled.   5. Hyperlipidemia :    she cut her simvastatin in half.  She was having muscle aches.  She does not know the dose and it is not listed on her med list  She has tried other statins but did not tolerate it   Kristeen MissPhilip Karlin Binion, MD  11/28/2015 4:13 PM    [facility] [address],  [address] [place], [place]  [address] Pager [phone] Phone: [phone](336) [phone]; Fax: [phone](336) [phone]   [facility]  [address] [place], [place]  [address] [phone]   Fax [phone]) [phone]

## 2015-11-29 ENCOUNTER — Telehealth: Payer: Self-pay | Admitting: Cardiovascular Disease

## 2015-11-29 NOTE — Telephone Encounter (Signed)
Pt's dtr calling per Dr. Elease HashimotoNahser to confirm dosage for Simvastatin 20 mg 1 qd.

## 2015-11-29 NOTE — Telephone Encounter (Signed)
Spoke with patient who states she is currently taking 1/2 of a simvastatin 20 mg tab.  She states her PCP will recheck her cholesterol in about 2 months.  She states she will try to tolerate a whole 20 mg tab because she knows Dr. Elease HashimotoNahser wants her to take more than 10 mg.  She states she will notify us of her cholesterol results when they are checked and Dr. Elease HashimotoNahser can advise about statin.  She thanked me for the call.

## 2015-12-11 ENCOUNTER — Other Ambulatory Visit: Payer: Self-pay | Admitting: Cardiovascular Disease

## 2016-02-24 ENCOUNTER — Encounter: Payer: Self-pay | Admitting: *Deleted

## 2016-03-05 NOTE — Discharge Instructions (Signed)
INSTRUCTIONS FOLLOWING OCULOPLASTIC SURGERY °AMY M. FOWLER, MD ° °AFTER YOUR EYE SURGERY, THER ARE MANY THINGS THWIHC YOU, THE PATIENT, CAN DO TO ASSURE THE BEST POSSIBLE RESULT FROM YOUR OPERATION.  THIS SHEET SHOULD BE REFERRED TO WHENEVER QUESTIONS ARISE.  IF THERE ARE ANY QUESTIONS NOT ANSWERED HERE, DO NOT HESITATE TO CALL OUR OFFICE AT [phone] OR [phone].  THERE IS ALWAYS [facility] AVAILABLE TO CALL IF QUESTIONS OR PROBLEMS ARISE. ° °VISION: Your vision may be blurred and out of focus after surgery until you are able to stop using your ointment, swelling resolves and your eye(s) heal. This may take 1 to 2 weeks at the least.  If your vision becomes gradually more dim or dark, this is not normal and you need to call our office immediately. ° °EYE CARE: For the first 48 hours after surgery, use ice packs frequently - “20 minutes on, 20 minutes off” - to help reduce swelling and bruising.  Small bags of frozen peas or corn make good ice packs along with cloths soaked in ice water.  If you are wearing a patch or other type of dressing following surgery, keep this on for the amount of time specified by your doctor.  For the first week following surgery, you will need to treat your stitches with great care.  If is OK to shower, but take care to not allow soapy water to run into your eye(s) to help reduce changes of infection.  You may gently clean the eyelashes and around the eye(s) with cotton balls and sterile water, BUT DO NOT RUB THE STITCHES VIGOROUSLY.  Keeping your stitches moist with ointment will help promote healing with minimal scar formation. ° °ACTIVITY: When you leave the surgery center, you should go home, rest and be inactive.  The eye(s) may feel scratchy and keeping the eyes closed will allow for faster healing.  The first week following surgery, avoid straining (anything making the face turn red) or lifting over 20 pounds.  Additionally, avoid bending which causes your head to go below  your waist.  Using your eyes will NOT harm them, so feel free to read, watch television, use the computer, etc as desired.  Driving depends on each individual, so check with your doctor if you have questions about driving. ° °MEDICATIONS:  You will be given a prescription for an ointment to use 4 times a day on your stitches.  You can use the ointment in your eyes if they feel scratchy or irritated.  If you eyelid(s) don’t close completely when you sleep, put some ointment in your eyes before bedtime. ° °EMERGENCY: If you experience SEVERE EYE PAIN OR HEADACHE UNRELIEVED BY TYLENOL OR PERCOCET, NAUSEA OR VOMITING, WORSENING REDNESS, OR WORSENING VISION (ESPECIALLY VISION THAT WA INITIALLY BETTER) CALL [phone] OR [phone] DURING BUSINESS HOURS OR AFTER HOURS. ° °General Anesthesia, Adult, Care After °These instructions provide you with information about caring for yourself after your procedure. Your health care provider may also give you more specific instructions. Your treatment has been planned according to current medical practices, but problems sometimes occur. Call your health care provider if you have any problems or questions after your procedure. °What can I expect after the procedure? °After the procedure, it is common to have: °· Vomiting. °· A sore throat. °· Mental slowness. °It is common to feel: °· Nauseous. °· Cold or shivery. °· Sleepy. °· Tired. °· Sore or achy, even in parts of your body where you did not have surgery. °Follow   these instructions at home: °For at least 24 hours after the procedure:  °· Do not: °¨ Participate in activities where you could fall or become injured. °¨ Drive. °¨ Use heavy machinery. °¨ Drink alcohol. °¨ Take sleeping pills or medicines that cause drowsiness. °¨ Make important decisions or sign legal documents. °¨ Take care of children on your own. °· Rest. °Eating and drinking  °· If you vomit, drink water, juice, or soup when you can drink without  vomiting. °· Drink enough fluid to keep your urine clear or pale yellow. °· Make sure you have little or no nausea before eating solid foods. °· Follow the diet recommended by your health care provider. °General instructions  °· Have a responsible adult stay with you until you are awake and alert. °· Return to your normal activities as told by your health care provider. Ask your health care provider what activities are safe for you. °· Take over-the-counter and prescription medicines only as told by your health care provider. °· If you smoke, do not smoke without supervision. °· Keep all follow-up visits as told by your health care provider. This is important. °Contact a health care provider if: °· You continue to have nausea or vomiting at home, and medicines are not helpful. °· You cannot drink fluids or start eating again. °· You cannot urinate after 8-12 hours. °· You develop a skin rash. °· You have fever. °· You have increasing redness at the site of your procedure. °Get help right away if: °· You have difficulty breathing. °· You have chest pain. °· You have unexpected bleeding. °· You feel that you are having a life-threatening or urgent problem. °This information is not intended to replace advice given to you by your health care provider. Make sure you discuss any questions you have with your health care provider. °Document Released: 05/14/2000 Document Revised: 07/11/2015 Document Reviewed: 01/20/2015 °Elsevier Interactive Patient Education © 2017 Elsevier Inc. ° °

## 2016-03-06 ENCOUNTER — Encounter
Admission: RE | Disposition: A | Discharge status: Home / Self Care | Payer: Self-pay | Source: Ambulatory Visit | Attending: Ophthalmology

## 2016-03-06 ENCOUNTER — Ambulatory Visit: Payer: Medicare Other | Admitting: Anesthesiology

## 2016-03-06 ENCOUNTER — Ambulatory Visit
Admission: RE | Admit: 2016-03-06 | Discharge: 2016-03-06 | Disposition: A | Discharge status: Home / Self Care | Payer: Medicare Other | Source: Ambulatory Visit | Attending: Ophthalmology | Admitting: Ophthalmology

## 2016-03-06 DIAGNOSIS — I252 Old myocardial infarction: Secondary | ICD-10-CM | POA: Insufficient documentation

## 2016-03-06 DIAGNOSIS — I2510 Atherosclerotic heart disease of native coronary artery without angina pectoris: Secondary | ICD-10-CM | POA: Diagnosis not present

## 2016-03-06 DIAGNOSIS — F419 Anxiety disorder, unspecified: Secondary | ICD-10-CM | POA: Insufficient documentation

## 2016-03-06 DIAGNOSIS — H02403 Unspecified ptosis of bilateral eyelids: Secondary | ICD-10-CM | POA: Insufficient documentation

## 2016-03-06 DIAGNOSIS — I10 Essential (primary) hypertension: Secondary | ICD-10-CM | POA: Insufficient documentation

## 2016-03-06 DIAGNOSIS — F329 Major depressive disorder, single episode, unspecified: Secondary | ICD-10-CM | POA: Insufficient documentation

## 2016-03-06 DIAGNOSIS — Z8673 Personal history of transient ischemic attack (TIA), and cerebral infarction without residual deficits: Secondary | ICD-10-CM | POA: Diagnosis not present

## 2016-03-06 HISTORY — PX: BROW LIFT: SHX178

## 2016-03-06 HISTORY — DX: Presence of dental prosthetic device (complete) (partial): Z97.2

## 2016-03-06 SURGERY — BLEPHAROPLASTY
Anesthesia: Monitor Anesthesia Care | Site: Eye | Laterality: Bilateral | Wound class: Clean

## 2016-03-06 MED ORDER — TRAMADOL HCL 50 MG PO TABS: ORAL_TABLET | ORAL | 0 refills | Status: DC

## 2016-03-06 MED ORDER — ERYTHROMYCIN 5 MG/GM OP OINT
TOPICAL_OINTMENT | OPHTHALMIC | Status: DC | PRN
  Administered 2016-03-06: 1 via OPHTHALMIC

## 2016-03-06 MED ORDER — BSS IO SOLN
INTRAOCULAR | Status: DC | PRN
  Administered 2016-03-06: 5 mL

## 2016-03-06 MED ORDER — ERYTHROMYCIN 5 MG/GM OP OINT: TOPICAL_OINTMENT | OPHTHALMIC | 3 refills | Status: DC

## 2016-03-06 MED ORDER — LIDOCAINE-EPINEPHRINE 2 %-1:100000 IJ SOLN
INTRAMUSCULAR | Status: DC | PRN
  Administered 2016-03-06: 1 mL via OPHTHALMIC

## 2016-03-06 MED ORDER — ACETAMINOPHEN 325 MG PO TABS: 325.0000 mg | ORAL_TABLET | ORAL | Status: DC | PRN

## 2016-03-06 MED ORDER — ACETAMINOPHEN 160 MG/5ML PO SOLN: 325.0000 mg | ORAL | Status: DC | PRN

## 2016-03-06 MED ORDER — MIDAZOLAM HCL 2 MG/2ML IJ SOLN
INTRAMUSCULAR | Status: DC | PRN
Start: 2016-03-06 — End: 2016-03-06
  Administered 2016-03-06 (×2): 1 mg via INTRAVENOUS

## 2016-03-06 MED ORDER — TETRACAINE HCL 0.5 % OP SOLN
OPHTHALMIC | Status: DC | PRN
  Administered 2016-03-06: 1 [drp] via OPHTHALMIC

## 2016-03-06 MED ORDER — ALFENTANIL 500 MCG/ML IJ INJ
INJECTION | INTRAMUSCULAR | Status: DC | PRN
  Administered 2016-03-06 (×2): 250 ug via INTRAVENOUS
  Administered 2016-03-06: 500 ug via INTRAVENOUS

## 2016-03-06 MED ORDER — LACTATED RINGERS IV SOLN
INTRAVENOUS | Status: DC
  Administered 2016-03-06: 11:00:00 via INTRAVENOUS

## 2016-03-06 SURGICAL SUPPLY — 37 items
APPLICATOR COTTON TIP WD 3 STR (MISCELLANEOUS) ×3 IMPLANT
BLADE SURG 15 STRL LF DISP TIS (BLADE) ×1 IMPLANT
BLADE SURG 15 STRL SS (BLADE) ×3
CORD BIP STRL DISP 12FT (MISCELLANEOUS) ×3 IMPLANT
DRAPE HEAD BAR (DRAPES) ×3 IMPLANT
GAUZE SPONGE 4X4 12PLY STRL (GAUZE/BANDAGES/DRESSINGS) ×3 IMPLANT
GAUZE SPONGE NON-WVN 2X2 STRL (MISCELLANEOUS) ×10 IMPLANT
GLOVE SURG LX 7.0 MICRO (GLOVE) ×4
GLOVE SURG LX STRL 7.0 MICRO (GLOVE) ×2 IMPLANT
MARKER SKIN XFINE TIP W/RULER (MISCELLANEOUS) ×3 IMPLANT
NDL FILTER BLUNT 18X1 1/2 (NEEDLE) ×1 IMPLANT
NDL HYPO 30X.5 LL (NEEDLE) ×2 IMPLANT
NEEDLE FILTER BLUNT 18X 1/2SAF (NEEDLE) ×2
NEEDLE FILTER BLUNT 18X1 1/2 (NEEDLE) ×1 IMPLANT
NEEDLE HYPO 30X.5 LL (NEEDLE) ×6 IMPLANT
PACK DRAPE NASAL/ENT (PACKS) ×3 IMPLANT
SOL PREP PVP 2OZ (MISCELLANEOUS) ×3
SOLUTION PREP PVP 2OZ (MISCELLANEOUS) ×1 IMPLANT
SPONGE VERSALON 2X2 STRL (MISCELLANEOUS) ×30
SUT CHROMIC 4-0 (SUTURE)
SUT CHROMIC 4-0 M2 12X2 ARM (SUTURE)
SUT CHROMIC 5 0 P 3 (SUTURE) IMPLANT
SUT ETHILON 4 0 CL P 3 (SUTURE) IMPLANT
SUT MERSILENE 4-0 S-2 (SUTURE) IMPLANT
SUT PLAIN GUT (SUTURE) ×3 IMPLANT
SUT PROLENE 5 0 P 3 (SUTURE) IMPLANT
SUT PROLENE 6 0 P 1 18 (SUTURE) ×4 IMPLANT
SUT SILK 4 0 G 3 (SUTURE) IMPLANT
SUT VIC AB 5-0 P-3 18X BRD (SUTURE) IMPLANT
SUT VIC AB 5-0 P3 18 (SUTURE)
SUT VICRYL 6-0  S14 CTD (SUTURE)
SUT VICRYL 6-0 S14 CTD (SUTURE) IMPLANT
SUT VICRYL 7 0 TG140 8 (SUTURE) IMPLANT
SUTURE CHRMC 4-0 M2 12X2 ARM (SUTURE) IMPLANT
SYR 3ML LL SCALE MARK (SYRINGE) ×3 IMPLANT
SYRINGE 10CC LL (SYRINGE) ×3 IMPLANT
WATER STERILE IRR 500ML POUR (IV SOLUTION) ×3 IMPLANT

## 2016-03-06 NOTE — H&P (Signed)
See the history and physical completed at [facility] on 02/17/16 and scanned into the chart.

## 2016-03-06 NOTE — Op Note (Signed)
Preoperative Diagnosis:  Visually significant blepharoptosis both Upper Eyelid(s)  Postoperative Diagnosis:  Same.  Procedure(s) Performed:   Blepharoptosis repair with levator aponeurosis advancement bilateral Upper Eyelid(s)  Teaching Surgeon: Philis Pique. Vickki Muff, M.D.  Assistants: none  Anesthesia: MAC  Specimens: None.  Estimated Blood Loss: Minimal.  Complications: None.  Operative Findings: None Dictated  PROCEDURE:  Allergies were reviewed and the patient is allergic to amoxicillin; penicillins; tolectin [tolmetin sodium]; tolmetin; ciprofloxacin; statins; ioxaglate; other; prolactin; bactrim [sulfamethoxazole-trimethoprim]; codeine; contrast media [iodinated diagnostic agents]; imipenem; megestrol; procardia [nifedipine]; and vancomycin..   After the risks, benefits, complications and alternatives were discussed with the patient, appropriate informed consent was obtained. While seated in an upright position and looking in primary gaze, the mid pupillary line was marked on the upper eyelid margins bilaterally. The patient was then brought to the operating suite and reclined supine.  Timeout was conducted and the patient was sedated. Local anesthetic consisting of a 50-50 mixture of 2% lidocaine with epinephrine and 0.75% bupivacaine with added Hylenex was injected subcutaneously to the bilateral upper eyelid(s). After adequate local was instilled, the patient was prepped and draped in the usual sterile fashion for eyelid surgery.   Attention was turned to the upper eyelids. A 32m upper eyelid crease incision line was marked with calipers on both upper eyelid(s).  Attention was turned to the  right upper eyelid. A #15 blade was used to open the premarked incision line and hemostasis was obtained with bipolar cautery. Westcott scissors were then used to transect through orbicularis for the length of the incision down to the tarsal plate. Epitarsus was dissected to create a smooth  surface to suture to. Dissection was then carried superiorly in the plane between orbicularis and orbital septum. Once the preaponeurotic fat pocket was identified, the orbital septum was opened. This revealed the levator and its aponeurosis.    Attention was then turned to the opposite eyelid where the same procedure was performed in the same manner.   3 interrupted 6-0 Prolene sutures were then passed partial thickness through the tarsal plates of both upper eyelid(s). These sutures were placed in line with the mid pupillary, medial limbal, and lateral limbal lines. The sutures were fixed to the levator aponeurosis and adjusted until a nice lid height and contour were achieved. Once nice symmetry was achieved, the skin incisions were closed with a running 6-0 fast absorbing plain suture. The patient tolerated the procedure well. Erythromycin ophthalmic ointment was applied to the incision site(s) followed by ice packs. The patient was taken to the recovery area where she recovered without difficulty.  Post-Op Plan/Instructions:  Ms. JBetterwas instructed to use ice packs frequently for the next 48 hours. Her was instructed to use erythromycin ophthalmic ointment on her incisions 4 times a day for the next 12 to 14 days. She was given a prescription for Percocet for pain control should Tylenol not be effective. [facility].    Farron Lafond M. FVickki Muff M.D. Attending,Ophthalmology

## 2016-03-06 NOTE — Anesthesia Preprocedure Evaluation (Signed)
Anesthesia Evaluation  Patient identified by MRN, date of birth, ID band Patient awake    Reviewed: Allergy & Precautions, H&P , NPO status , Patient's Chart, lab work & pertinent test results  Airway Mallampati: II  TM Distance: >3 FB Neck ROM: full    Dental no notable dental hx. (+) Partial Upper   Pulmonary    Pulmonary exam normal        Cardiovascular hypertension, + CAD and + Peripheral Vascular Disease  + Valvular Problems/Murmurs  Rhythm:regular Rate:Normal + Systolic murmurs    Neuro/Psych PSYCHIATRIC DISORDERS CVA    GI/Hepatic   Endo/Other    Renal/GU Renal disease     Musculoskeletal   Abdominal   Peds  Hematology   Anesthesia Other Findings   Reproductive/Obstetrics                             Anesthesia Physical Anesthesia Plan  ASA: III  Anesthesia Plan: MAC   Post-op Pain Management:    Induction:   Airway Management Planned:   Additional Equipment:   Intra-op Plan:   Post-operative Plan:   Informed Consent: I have reviewed the patients History and Physical, chart, labs and discussed the procedure including the risks, benefits and alternatives for the proposed anesthesia with the patient or authorized representative who has indicated his/her understanding and acceptance.     Plan Discussed with:   Anesthesia Plan Comments:         Anesthesia Quick Evaluation

## 2016-03-06 NOTE — Transfer of Care (Signed)
Immediate Anesthesia Transfer of Care Note  Patient: Frances Mendoza  Procedure(s) Performed: Procedure(s): BLEPHAROPLASTY REPAIR, RESECT EX (Bilateral)  Patient Location: PACU  Anesthesia Type: MAC  Level of Consciousness: awake, alert  and patient cooperative  Airway and Oxygen Therapy: Patient Spontanous Breathing and Patient connected to supplemental oxygen  Post-op Assessment: Post-op Vital signs reviewed, Patient's Cardiovascular Status Stable, Respiratory Function Stable, Patent Airway and No signs of Nausea or vomiting  Post-op Vital Signs: Reviewed and stable  Complications: No apparent anesthesia complications

## 2016-03-06 NOTE — Anesthesia Postprocedure Evaluation (Signed)
Anesthesia Post Note  Patient: Frances Mendoza  Procedure(s) Performed: Procedure(s) (LRB): BLEPHAROPLASTY REPAIR, RESECT EX (Bilateral)  Patient location during evaluation: PACU Anesthesia Type: MAC Level of consciousness: awake and alert and oriented Pain management: satisfactory to patient Vital Signs Assessment: post-procedure vital signs reviewed and stable Respiratory status: spontaneous breathing, nonlabored ventilation and respiratory function stable Cardiovascular status: blood pressure returned to baseline and stable Postop Assessment: Adequate PO intake and No signs of nausea or vomiting Anesthetic complications: no    Raliegh Ip

## 2016-03-06 NOTE — Anesthesia Procedure Notes (Signed)
Procedure Name: MAC Date/Time: 03/06/2016 11:45 AM Performed by: Cameron Ali Pre-anesthesia Checklist: Patient identified, Emergency Drugs available, Suction available, Timeout performed and Patient being monitored Patient Re-evaluated:Patient Re-evaluated prior to inductionOxygen Delivery Method: Nasal cannula Placement Confirmation: positive ETCO2

## 2016-03-07 ENCOUNTER — Encounter: Payer: Self-pay | Admitting: Ophthalmology

## 2016-03-20 ENCOUNTER — Encounter: Payer: Self-pay | Admitting: Nurse Practitioner

## 2016-03-20 ENCOUNTER — Ambulatory Visit (INDEPENDENT_AMBULATORY_CARE_PROVIDER_SITE_OTHER): Payer: Medicare Other | Admitting: Nurse Practitioner

## 2016-03-20 VITALS — BP 143/83 | HR 72 | Ht 62.0 in | Wt 153.0 lb

## 2016-03-20 DIAGNOSIS — R413 Other amnesia: Secondary | ICD-10-CM | POA: Diagnosis not present

## 2016-03-20 DIAGNOSIS — Z8673 Personal history of transient ischemic attack (TIA), and cerebral infarction without residual deficits: Secondary | ICD-10-CM | POA: Diagnosis not present

## 2016-03-20 MED ORDER — DONEPEZIL HCL 10 MG PO TABS: 10.0000 mg | ORAL_TABLET | Freq: Every day | ORAL | 3 refills | Status: DC

## 2016-03-20 MED ORDER — MEMANTINE HCL 10 MG PO TABS: 10.0000 mg | ORAL_TABLET | Freq: Two times a day (BID) | ORAL | 3 refills | Status: DC

## 2016-03-20 NOTE — Progress Notes (Signed)
I agree with the above plan 

## 2016-03-20 NOTE — Patient Instructions (Signed)
Memory score is stable Continue Aricept 10 mg daily Continue Namenda 10 mg twice daily Continue Plavix secondary stroke prevention Follow-up yearly and prn

## 2016-03-20 NOTE — Progress Notes (Signed)
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, presented with confusion, delirium, gait difficulty, TEE showed ejection fraction 65%, calcified mitral valve, no vegetation, was diagnosed with a septic endocarditis, was treated with prolonged antibiotics, antiplatelet agent, also with non-ST elevation MI, she also had past medical Frances Mendoza of hypertension, anxiety, hyperlipidemia, she had a high school graduate, lives at home with her husband,  Ever since that event she has become much less active, only driving occasionally at short distance, also has short-term memory trouble, occasionally confusion, no longer cooking, she also complains of chronic low back pain, long-standing Frances Mendoza of depression anxiety, she is taking Valium 5 mg 3 times a day, her husband has taken over her prescription medication management, but she tends to take a lot of supplements, as needed Tylenol, ibuprofen,  In May 20 fourth 2014, she woke up was noticed by her family to have confusion, unsteady gait, lasting for 24 hours, no seizure activity was noted, she was admitted to the hospital, May 24th through 27th, 2014.  MRI head again showed acute infarction: Two or three punctate acute infarctions in the left frontal region affecting the caudate head and left frontal lobe.  No swelling, hemorrhage or mass effect.  Moderate chronic small vessel changes elsewhere throughout the brain.    MRA HEAD: No major vessel occlusion or correctable proximal stenosis. Repeat echocardiogram showed ejection fraction  65%, calcified mitral valve, no vegetations, EEG was normal, she is not back to her normal self, very argumentary, especially to her daughter. Ultrasound of carotid artery showed no large vessel disease  Mother died age 64 from stroke, father died age 67 from heart attack, no family Frances Mendoza of dementia  UPDATE Sept 28th 2015:YY Her husband is helping with her medications, she is alone at visit, she is overall doing well, thinking she is improving. She works in her garden a lot, still active at home, cooking Sunday lunch for her extended family.  She has no trouble walking, she fell sometimes when turning quickly  UPDATE Mar 21 2015: Frances Mendoza Mendoza is with her daughter at visit, she had bad cold in Dec 2016, take few weeks to recover, she complains of gradual worsening memory loss, but she still very active, she still drives, no gait difficulties, she has chronic insomnia, taking Valium 5 mg half to one tablets every night as needed UPDATE 01/30/2018[name] Mendoza, 81 year old female returns for follow-up with her daughter. She has a Frances Mendoza of memory loss and stroke event in 2014. She is currently on Plavix for secondary stroke prevention with minimal bruising and bleeding. Her primary care follows her risk factors. She also has a Frances Mendoza of memory loss and is currently on Aricept and Namenda without side effects to the medication. She had her eyelids lifted 03/06/2016 because they were interfering with her vision. She claims she fell a couple weeks ago in the snow. No apparent injury. She continues to live in her own home, does some cooking but not much. No safety issues identified. She continues to be independent in her activities of daily living she returns for reevaluation  REVIEW OF SYSTEMS: Full 14 system review of systems performed and notable only for those listed, all others are neg:  Constitutional: neg  Cardiovascular: neg Ear/Nose/Throat: neg  Skin: neg Eyes: neg Respiratory:  neg Gastroitestinal: neg  Hematology/Lymphatic: neg  Endocrine: neg Muscoskeletal Gait abnormality Allergy/Immunology: neg NeuroMemory loss, Frances Mendoza of stroke Psychiatric: neg Sleep : neg   ALLERGIES: Allergies  Allergen Reactions  . Amoxicillin Anaphylaxis  . Penicillins Anaphylaxis  . Tolectin [Tolmetin Sodium] Anaphylaxis    Took at same time as amoxicillin when she had anaphylactic reaction.  . Tolmetin Anaphylaxis  . Ciprofloxacin Rash    Entire body looked as if sunburned, then skin peeled off.  . Statins Other (See Comments)    Leg pain  . Ioxaglate Hives    Patient had severe hives after Myelogram.  No issues breathing.  Returned for  a nerve root injection, was given Benadryl, mentioned some minor itching to daughter on the way home but no further issues. Was given a Benadryl today for this injection.   . Other Other (See Comments)  . Prolactin   . Bactrim [Sulfamethoxazole-Trimethoprim] Rash    ? Early rash on palms  . Codeine Nausea And Vomiting  . Contrast Media [Iodinated Diagnostic Agents] Hives    Patient had severe hives after Myelogram.  No issues breathing.  Returned for  a nerve root injection, was given Benadryl, mentioned some minor itching to daughter on the way home but no further issues. Was given a Benadryl today for this injection.   . Imipenem Rash  . Megestrol Rash  . Procardia [Nifedipine] Other (See Comments)    Causes aches down legs  . Vancomycin Rash    HOME MEDICATIONS: Outpatient Medications Prior to Visit  Medication Sig Dispense Refill  . albuterol (PROVENTIL HFA;VENTOLIN HFA) 108 (90 BASE) MCG/ACT inhaler Inhale 2 puffs into the lungs every 6 (six) hours as needed for wheezing or shortness of breath.    . Ascorbic Acid (VITAMIN C PO) Take by mouth daily.    Marland Kitchen. azelastine (ASTELIN) 0.1 % nasal spray Place into both nostrils daily as needed for rhinitis. Use in each nostril as directed    . Cholecalciferol (VITAMIN D PO) Take by mouth  daily.    Marland Kitchen. CINNAMON PO Take by mouth daily.    . clopidogrel (PLAVIX) 75 MG tablet Take 1 tablet (75 mg total) by mouth daily with breakfast. 30 tablet 0  . Cyanocobalamin (VITAMIN B12 PO) Take by mouth daily.    . diazepam (VALIUM) 5 MG tablet Take 5 mg by mouth every 8 (eight) hours as needed for anxiety. 3 daily    . donepezil (ARICEPT) 10 MG tablet Take 1 tablet (10 mg total) by mouth daily. 90 tablet 3  . erythromycin (ROMYCIN) ophthalmic ointment Use a small amount on your sutures 4 times a day for the next 2 weeks. Switch to Aquaphor ointment should allergy develop. 3.5 g 3  . fluticasone (FLONASE) 50 MCG/ACT nasal spray Place 1 spray into the nose 2 (two) times daily.      . Ginger, Zingiber officinalis, (GINGER ROOT PO) Take by mouth daily.    . hydrochlorothiazide (HYDRODIURIL) 25 MG tablet TAKE 1 TABLET BY MOUTH EVERY DAY 90 tablet 3  . loratadine (CLARITIN) 10 MG tablet Take 10 mg by mouth daily as needed for allergies.    . Magnesium 250 MG TABS Take by mouth daily.    . meclizine (ANTIVERT) 25 MG tablet Take 25 mg by mouth 3 (three)  times daily as needed for dizziness.    . memantine (NAMENDA) 10 MG tablet Take 1 tablet (10 mg total) by mouth 2 (two) times daily. 60 tablet 11  . Misc Natural Products (TART CHERRY ADVANCED PO) Take by mouth daily.    . nitroGLYCERIN (NITROSTAT) 0.4 MG SL tablet Place 1 tablet (0.4 mg total) under the tongue every 5 (five) minutes x 3 doses as needed for chest pain. 25 tablet 11  . potassium chloride (KLOR-CON 10) 10 MEQ tablet TAKE 2 TABLETS BY MOUTH DAILY 180 tablet 3  . sertraline (ZOLOFT) 100 MG tablet Take 50 mg by mouth daily.     . traMADol (ULTRAM) 50 MG tablet Take 1 every 4-6 hours as needed for pain not controlled by Tylenol 6 tablet 0   No facility-administered medications prior to visit.     PAST MEDICAL Frances Mendoza: Past Medical Frances Mendoza:  Diagnosis Date  . Allergy   . Anxiety   . Arthritis    osteoarthritis  . Back problem   .  CAD (coronary artery disease)    a. admx with CP c/w [place] 4/14 => LHC: pLAD 40%, RCA with lum irregs => med Rx  . 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 Depression   . Diverticulosis   . Endocarditis 10/18/2010   treated with vancomycin, gentamicin, and Cipro  . Hemorrhoids, internal   . HLD (hyperlipidemia)   . Hypertension   . Hypokalemia   . Insomnia   . Renal insufficiency 11/29/2010  . Septic embolism (HCC) 10/18/2010   endocarditis -- per TEE  . Vertigo   . Wears dentures    partial lower. upper doesn't fit    PAST SURGICAL Frances Mendoza: Past Surgical Frances Mendoza:  Procedure Laterality Date  . ABDOMINAL HYSTERECTOMY  1979   full  . bladder tuck  2007?  Marland Kitchen BROW LIFT Bilateral 03/06/2016   Procedure: BLEPHAROPLASTY REPAIR, RESECT EX;  Surgeon: Imagene Riches, MD;  Location: [facility];  Service: Ophthalmology;  Laterality: Bilateral;  . CHOLECYSTECTOMY  09/29/1999  . LEFT HEART CATHETERIZATION WITH CORONARY ANGIOGRAM N/A 05/23/2012   Procedure: LEFT HEART CATHETERIZATION WITH CORONARY ANGIOGRAM;  Surgeon: Rollene Rotunda, MD;  Location: [facility] C[facility];  Service: Cardiovascular;  Laterality: N/A;  . TEE WITHOUT CARDIOVERSION  10/10/2010    done with suggestion of small mitral vegetation.  ID was consulted for input and ID feels  patient with possible endocarditis with septic emboli    FAMILY Frances Mendoza: Family Frances Mendoza  Problem Relation Age of Onset  . Coronary artery disease      family Frances Mendoza of  . Colon cancer      family Frances Mendoza of  . Cerebral palsy Sister   . Colon cancer Brother   . Prostate cancer Brother     SOCIAL Frances Mendoza: Social Frances Mendoza   Social Frances Mendoza  . Marital status: Married    Spouse name: Dimas Aguas  . Number of children: 3  . Years of education: 12   Occupational Frances Mendoza  .      patient not employed  . Retired    Social Frances Mendoza Main Topics  . Smoking status: Never Smoker  . Smokeless tobacco: Never Used  . Alcohol use No   . Drug use: No  . Sexual activity: Not Currently   Other Topics Concern  . Not on file   Social Frances Mendoza Narrative   Patient lives at home with spouse. Howard   Caffeine Use: 2 cups daily   Patient has 3 children.  Patient is retired.            PHYSICAL EXAM  Vitals:   03/20/16 0945  BP: (!) 143/83  Pulse: 72  Weight: 153 lb (69.4 kg)  Height: 5\' 2"  (1.575 m)   Body mass index is 27.98 kg/m.  Generalized: Well developed, in no acute distress  Head: normocephalic and atraumatic,. Oropharynx benign  Neck: Supple, no carotid bruits  Cardiac: Regular rate rhythm, no murmur  Muscloskeletal Arthritic changes in fingers Neurological examination   Mentation: Alert oriented to time, place, Frances Mendoza taking. Attention span and concentration appropriate.   Follows all commands speech and language fluent.  MMSE - Mini Mental State Exam 03/20/2016 03/21/2015  Orientation to time 5 5  Orientation to Place 5 5  Registration 3 3  Attention/ Calculation 5 5  Recall 2 2  Language- name 2 objects 2 2  Language- repeat 1 1  Language- follow 3 step command 3 3  Language- read & follow direction 1 1  Write a sentence 1 1  Copy design 1 1  Total score 29 29  AFT 6. Clock drawing 4/4  Cranial nerve II-XII: Pupils were equal round reactive to light extraocular movements were full, visual field were full on confrontational test. Facial sensation and strength were normal. hearing was intact to finger rubbing bilaterally. Uvula tongue midline. head turning and shoulder shrug were normal and symmetric.Tongue protrusion into cheek strength was normal. Motor: normal bulk and tone, full strength in the BUE, BLE, fine finger movements normal, no pronator drift. Sensory: normal and symmetric to light touch, pinprick, and  Vibrain the upper and lower extremities Coordination: finger-nose-finger, heel-to-shin bilaterally, no dysmetria Reflexes: Brachioradialis 2/2, biceps 2/2, triceps 2/2, patellar  2/2, Achilles 2/2, plantar responses were flexor bilaterally. Gait and Station: Rising up from seated position without assistance, normal stance,  moderate stride, good arm swing, smooth turning, able to perform tiptoe, and heel walking without difficulty. Tandem gait is unsteady. No assistive device   DIAGNOSTIC DATA (LABS, IMAGING, TESTING) - Reviewed Lipid profile from 01/16/16 in care everywhere Trig 166 Cholestrol 271 LDL 169     ASSESSMENT AND PLAN  81 y.o. year old female  has a past medical Frances Mendoza of  Cerebrovascular accident, embolic (HCC) (10/18/2010); Depression; (hyperlipidemia); Hypertension;  Renal insufficiency (11/29/2010); and memory loss here to follow-up. The patient is a current patient of Dr. Terrace Arabia  who is out of the office today . This note is sent to the work in doctor.      PLAN: Memory score is stable Continue Aricept 10 mg daily will refill Continue Namenda 10 mg twice daily Will refill Continue Plavix secondary stroke prevention Discussed safety measures as it relates to memory loss  LDL 01/16/16  follow-up by primary care is 169, cholesterol 271. She has failed statin drugs in the past due to leg cramps and muscle weakness Importance of low-fat diet with lean meats fresh fruits and vegetables and fiber was explained Also fish oil Can be taken Encouraged chair exercises or Silver sneakers, importance of exercise to decrease risk of falling. Her tandem gait is unsteady and she does not use an assistive device. A cane would be advisable Greater than 50% of time during this 25 minute visit was spent on counseling,explanation of diagnosis, planning of further management, discussion with patient and daughter  and coordination of care Follow-up yearly and prn Nilda Riggs, [facility], [facility], APRN  [facility] [address] [place], [place] [address] [phone]

## 2016-03-26 ENCOUNTER — Encounter (HOSPITAL_COMMUNITY): Payer: Self-pay | Admitting: Radiology

## 2016-03-26 ENCOUNTER — Telehealth (HOSPITAL_COMMUNITY): Payer: Self-pay | Admitting: Radiology

## 2016-03-26 NOTE — Telephone Encounter (Signed)
Called to schedule echocardiogram, spoke with patient. She doesn't want to make a decision right now. There had been a death in the family.

## 2016-10-11 IMAGING — MR MR HEAD WO/W CM
10 of 11 series · 41 of 48 positions shown · IV contrast (Multihance 15ml)
Comparison: 01/06/2015 CT and 07/14/2012 MR.

CLINICAL DATA: 80-year-old hypertensive female with tinnitus and
dizziness for several years. Left-sided hearing loss.

Creatinine was obtained on site at [HOSPITAL] at [HOSPITAL].
Results: Creatinine 0.7 mg/dL.
EXAM:
MRI HEAD WITHOUT AND WITH CONTRAST
TECHNIQUE: Multiplanar, multiecho pulse sequences of the brain and surrounding
structures were obtained without and with intravenous contrast.
CONTRAST:  15mL MULTIHANCE GADOBENATE DIMEGLUMINE 529 MG/ML IV SOLN

[Series 5: T1 · sagittal · 4.0mm · 0.72mm/px · 4 of 26 slices shown (1 of 3)]
[im 1/26]
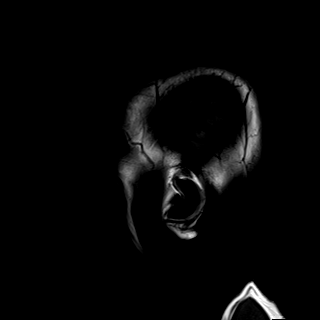
[im 9/26]
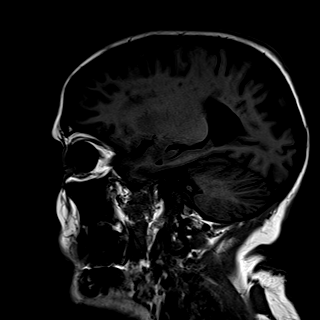
[im 17/26]
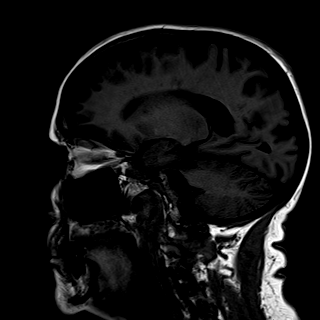
[im 26/26]
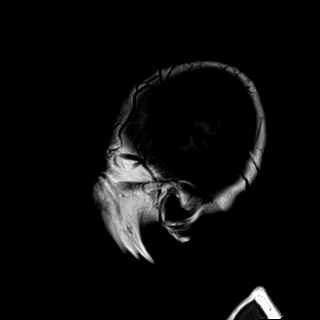

[Series 6: DWI · axial · 3.0mm · 1.44mm/px · z∈[-66,+86]mm · 8 of 80 slices shown (1 of 2)]
[im 1/80]
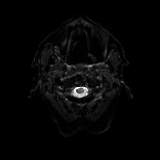
[im 12/80]
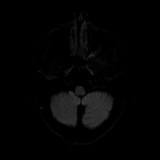
[im 23/80]
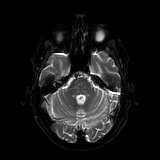
[im 34/80]
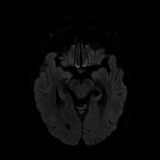
[im 46/80]
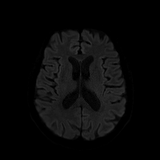
[im 57/80]
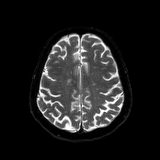
[im 68/80]
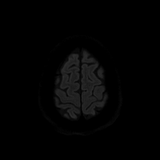
[im 80/80]
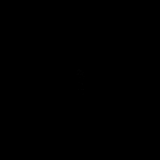

[Series 7: DWI · axial · 3.0mm · 1.44mm/px · z∈[-66,+86]mm · 4 of 40 slices shown (2 of 2)]
[im 1/40]
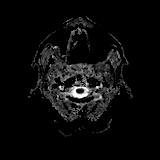
[im 14/40]
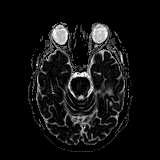
[im 27/40]
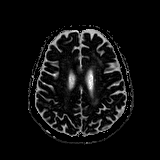
[im 40/40]
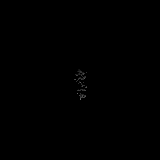

[Series 8: T2 · axial · 4.0mm · 0.36mm/px · z∈[-52,+88]mm · 3 of 28 slices shown]
[im 1/28]
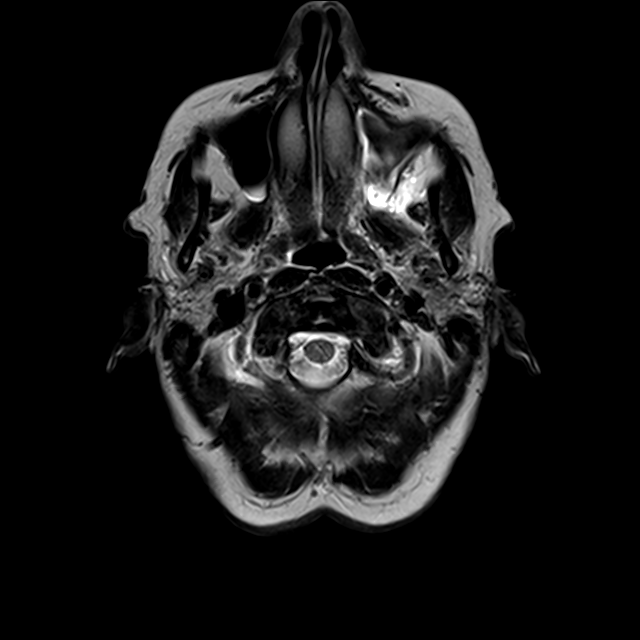
[im 14/28]
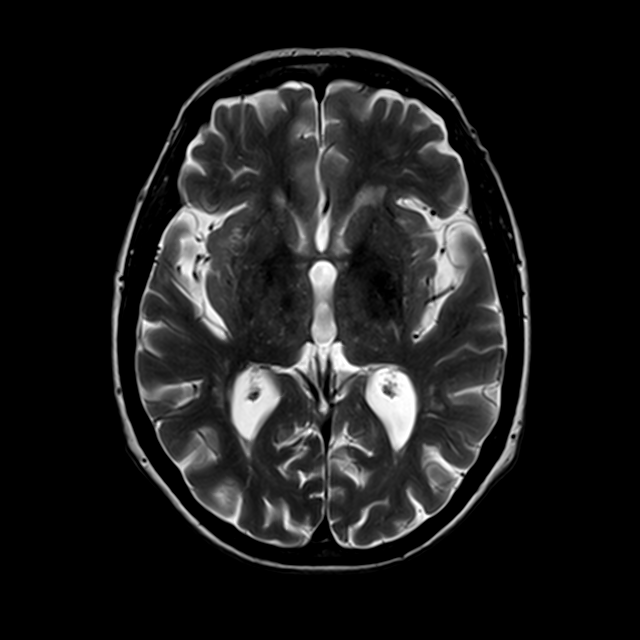
[im 28/28]
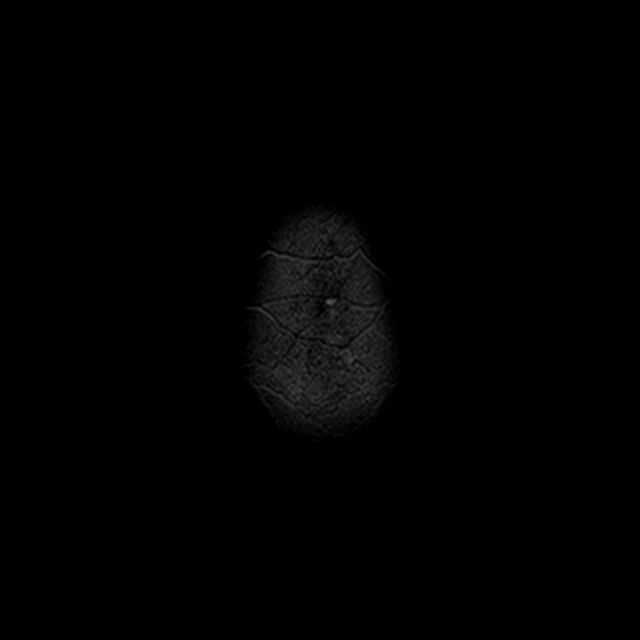

[Series 9: FLAIR · axial · 4.0mm · 0.72mm/px · z∈[-52,+88]mm · 3 of 28 slices shown]
[im 1/28]
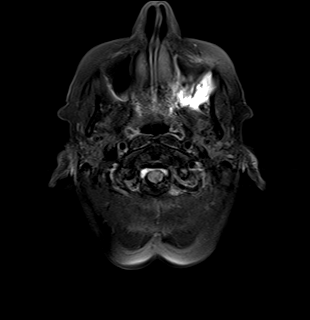
[im 14/28]
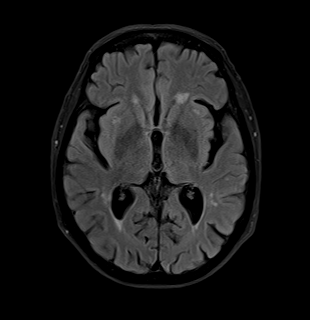
[im 28/28]
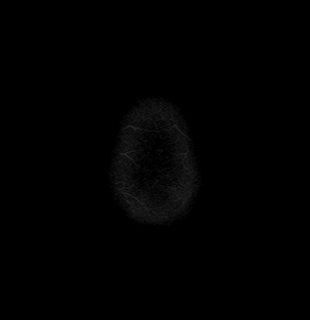

[Series 10: T1 · coronal · 2.5mm · 0.56mm/px · 1 of 11 slices shown (2 of 3)]
[im 1/11]
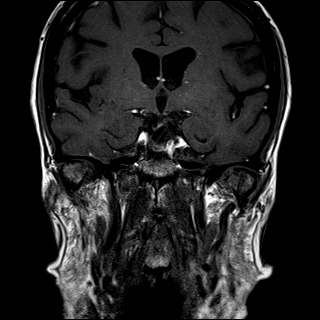

[Series 11: T1 · axial · 2.5mm · 0.59mm/px · 1 of 11 slices shown (3 of 3)]
[im 1/11]
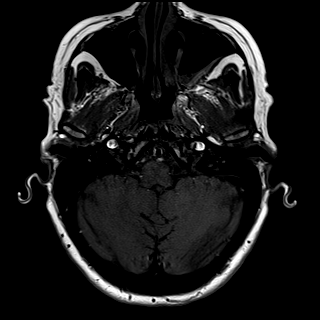

[Series 13: T1 post-contrast · coronal · 2.5mm · 0.56mm/px · 1 of 11 slices shown (1 of 3)]
[im 1/11]
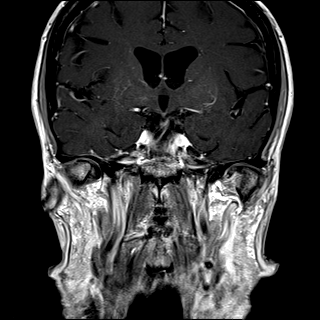

[Series 14: T1 post-contrast · axial · 2.5mm · 0.59mm/px · 1 of 11 slices shown (2 of 3)]
[im 1/11]
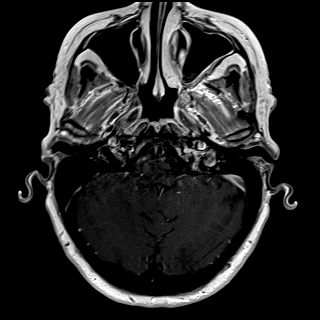

[Series 15: T1 post-contrast · axial · 1.0mm · 0.90mm/px · z∈[-55,+87]mm · 15 of 144 slices shown (3 of 3)]
[im 1/144]
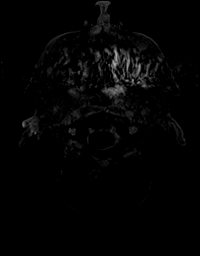
[im 11/144]
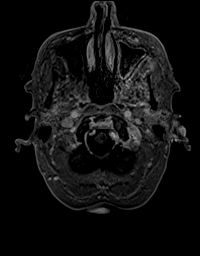
[im 21/144]
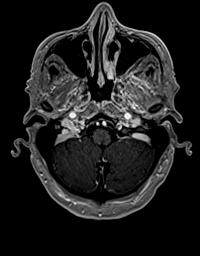
[im 31/144]
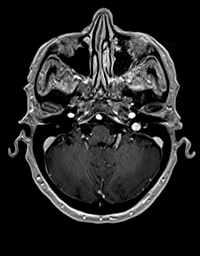
[im 41/144]
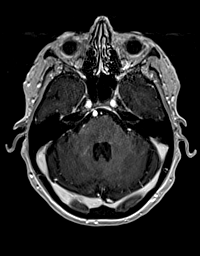
[im 52/144]
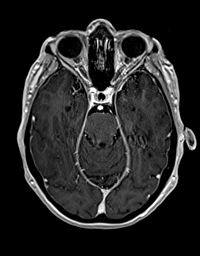
[im 62/144]
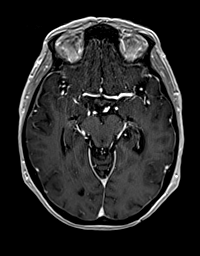
[im 72/144]
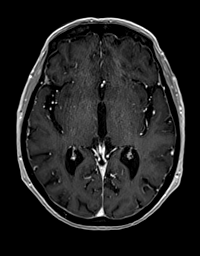
[im 82/144]
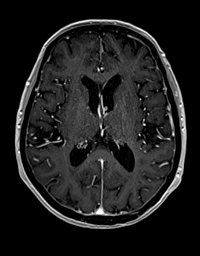
[im 92/144]
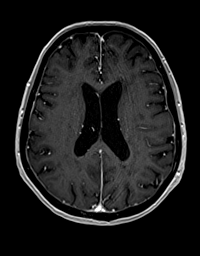
[im 103/144]
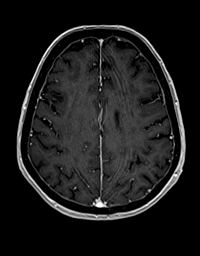
[im 113/144]
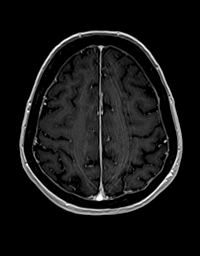
[im 123/144]
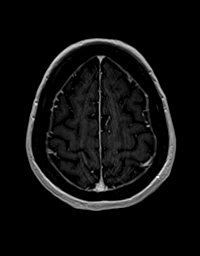
[im 133/144]
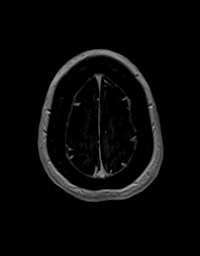
[im 144/144]
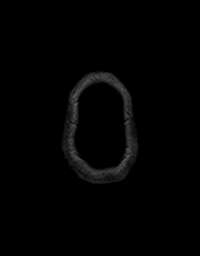

[41 of 48 positions shown; findings below may reference images not displayed]

FINDINGS: Labyrinthine structures are symmetric and normal bilaterally without
evidence of internal auditory canal enhancing lesion.

No acute infarct or intracranial hemorrhage.

Remote left temporal lobe infarct. Remote bilateral centrum
semiovale/corona radiata small infarcts. Remote small left thalamic
infarct.

Progressive prominent white matter changes most consistent with
result of chronic microvascular changes.

Major intracranial vascular structures are patent.

Transverse ligament hypertrophy. Cervical spondylotic changes with
spinal stenosis and various degrees of cord flattening C3-4 thru
C5-6.

Post lens replacement otherwise orbital structures unremarkable.

Maxillary sinus mucosal thickening greater on left with there is a
small air-fluid level raising possibility of acute sinusitis.
IMPRESSION: Labyrinthine structures are symmetric and normal bilaterally without
evidence of internal auditory canal enhancing lesion.

No acute infarct or intracranial hemorrhage.

Remote left temporal lobe infarct. Remote bilateral centrum
semiovale/corona radiata small infarcts. Remote small left thalamic
infarct.

Progressive prominent white matter changes most consistent with
result of chronic microvascular changes.

Transverse ligament hypertrophy. Cervical spondylotic changes with
spinal stenosis and various degrees of cord flattening C3-4 thru
C5-6.

Maxillary sinus mucosal thickening greater on left with there is a
small air-fluid level raising possibility of acute sinusitis.

## 2016-11-28 ENCOUNTER — Ambulatory Visit (HOSPITAL_COMMUNITY): Payer: Medicare Other | Attending: Cardiology

## 2016-11-28 ENCOUNTER — Other Ambulatory Visit: Payer: Self-pay

## 2016-11-28 DIAGNOSIS — I10 Essential (primary) hypertension: Secondary | ICD-10-CM | POA: Insufficient documentation

## 2016-11-28 DIAGNOSIS — I38 Endocarditis, valve unspecified: Secondary | ICD-10-CM

## 2016-11-28 DIAGNOSIS — E785 Hyperlipidemia, unspecified: Secondary | ICD-10-CM | POA: Insufficient documentation

## 2016-12-05 ENCOUNTER — Telehealth: Payer: Self-pay | Admitting: Nurse Practitioner

## 2016-12-05 NOTE — Telephone Encounter (Signed)
Patient wants to know if it is alright to take Gabapentin with the medications she is currently taking.

## 2016-12-06 NOTE — Telephone Encounter (Signed)
I spoke to pt and relayed that her pharmacist should be able to assist her in the question, but will be glad to ask CM/NP in relation to what we are giving her.  She actually had taken 3 caps since calling (these are her husbands) and the prescription she received is from her pcp. (who told her to check with us).

## 2016-12-06 NOTE — Telephone Encounter (Addendum)
I spoke to pt and relayed that this medication can cause confusion and CM did not recommend  as we see pt for memory loss and is also on valium.  Pt states that she takes the valium prn.  She verbalized understanding of this but will probably take it anyway.  I reiterated CM recommendation.

## 2016-12-06 NOTE — Telephone Encounter (Signed)
No I would not recommend that. We are seeing you for memory loss it can increase confusion and you are already on Valium which is sedating as well

## 2017-02-01 ENCOUNTER — Other Ambulatory Visit: Payer: Self-pay | Admitting: Cardiovascular Disease

## 2017-02-25 ENCOUNTER — Encounter: Payer: Self-pay | Admitting: Cardiovascular Disease

## 2017-02-25 ENCOUNTER — Ambulatory Visit (INDEPENDENT_AMBULATORY_CARE_PROVIDER_SITE_OTHER): Payer: Medicare Other | Admitting: Cardiovascular Disease

## 2017-02-25 VITALS — BP 114/64 | HR 87 | Ht 63.0 in | Wt 153.0 lb

## 2017-02-25 DIAGNOSIS — I058 Other rheumatic mitral valve diseases: Secondary | ICD-10-CM

## 2017-02-25 DIAGNOSIS — I059 Rheumatic mitral valve disease, unspecified: Secondary | ICD-10-CM

## 2017-02-25 NOTE — Progress Notes (Signed)
Frances Mendoza Date of Birth  September 19, 1935 [facility]     [place] Office  [address]    [address]   [address] [place], [place]  [address]    [place], [place]  [address] [phone]  Fax  [phone]  [phone]  Fax [phone]   Problem list 1. Mitral Valve Endocarditis - 2012 2.  Septic emboli leading to stroke   History of Present Illness:  Frances Mendoza is a 82 yo with a hx of bacterial endocarditis with subsequent septic emboli and CVA.  She was hospitalized in November for CP.  She ruled out for MI.  She complains of lots of pain in the back of both hips radiating down to her legs. She thinks may be related to one of her medications. He typically hurts worse when she stands up and walks around. It does not hurt at night when she is lying in bed.  The pain occurs whenever she stands or walks - not when she is lying down.  She denies any cardiac issues.  May 23, 2012  Frances Mendoza awoke with chest pain and left pain.  The pain lasted for 1-2 hours.  Her son checked her BP and HR which were normal.    The severe pain has resolved but her chest is still not quite normal.    She has had some pains in her chest in the past but this was more severe.  The pain resolved with a valium.  The pains were no pleuretic.  She had dyspnea associated with the pain.  No diaphoresis.  No syncope.  No palpitations.   No fever/ chills/ blood in urine or stool.   She gets some exercise - she has some dyspnea but no angina.    She has occasional episodes of palptations.  She feels like her heart is racing for 5 - 10 minutes.    She takes the valium 2-3 times a day.    She has chronic back pain and has been told that she needs to have surgery.  She does not want to have surgery.    May 07, 2013;  She is doing OK.  She is here alone today.  Has lots of anxiety. She has multiple general complaints.   Jan. 8, 2016:  Frances Mendoza is seen with her daughter today.  She has had  continued shortness of breath . She had an echo at [facility] which is basically unchanged from our echo She feels well When she went to her medical doctor's office, she had not taken her HCTZ and her BP was elevated.  She was told that she should probably take a different BP med.   Today, she is doing great from a cardiac standpoint.    Aug. 29,. 2016:  She is doing well   Oct. 9, 2017  Frances Mendoza is seen today for follow up of her endocarditis   February 25, 2017: Doing well Planning her garden for this coming spring  Watcher her salt intake   Current Outpatient Medications on File Prior to Visit  Medication Sig Dispense Refill  . albuterol (PROVENTIL HFA;VENTOLIN HFA) 108 (90 BASE) MCG/ACT inhaler Inhale 2 puffs into the lungs every 6 (six) hours as needed for wheezing or shortness of breath.    . Ascorbic Acid (VITAMIN C PO) Take by mouth daily.    Marland Kitchen azelastine (ASTELIN) 0.1 % nasal spray Place into both nostrils daily as needed for rhinitis. Use in each  nostril as directed    . benzonatate (TESSALON) 200 MG capsule TK 1 C PO TID prn  0  . Cholecalciferol (VITAMIN D PO) Take by mouth daily.    Marland Kitchen. CINNAMON PO Take by mouth daily.    . clarithromycin (BIAXIN) 500 MG tablet     . clopidogrel (PLAVIX) 75 MG tablet Take 1 tablet (75 mg total) by mouth daily with breakfast. 30 tablet 0  . Cyanocobalamin (VITAMIN B12 PO) Take by mouth daily.    . diazepam (VALIUM) 5 MG tablet Take 5 mg by mouth every 8 (eight) hours as needed for anxiety. 3 daily    . donepezil (ARICEPT) 10 MG tablet Take 1 tablet (10 mg total) by mouth daily. 90 tablet 3  . erythromycin (ROMYCIN) ophthalmic ointment Use a small amount on your sutures 4 times a day for the next 2 weeks. Switch to Aquaphor ointment should allergy develop. 3.5 g 3  . fluticasone (FLONASE) 50 MCG/ACT nasal spray Place 1 spray into the nose 2 (two) times daily.      . Ginger, Zingiber officinalis, (GINGER ROOT PO) Take by mouth daily.    .  hydrochlorothiazide (HYDRODIURIL) 25 MG tablet TAKE 1 TABLET BY MOUTH EVERY DAY 90 tablet 0  . loratadine (CLARITIN) 10 MG tablet Take 10 mg by mouth daily as needed for allergies.    . Magnesium 250 MG TABS Take by mouth daily.    . meclizine (ANTIVERT) 25 MG tablet Take 25 mg by mouth 3 (three) times daily as needed for dizziness.    . memantine (NAMENDA) 10 MG tablet Take 1 tablet (10 mg total) by mouth 2 (two) times daily. 180 tablet 3  . Misc Natural Products (TART CHERRY ADVANCED PO) Take by mouth daily.    . nitroGLYCERIN (NITROSTAT) 0.4 MG SL tablet Place 1 tablet (0.4 mg total) under the tongue every 5 (five) minutes x 3 doses as needed for chest pain. 25 tablet 11  . potassium chloride (KLOR-CON 10) 10 MEQ tablet TAKE 2 TABLETS BY MOUTH DAILY 180 tablet 3  . sertraline (ZOLOFT) 100 MG tablet Take 50 mg by mouth daily.     . traMADol (ULTRAM) 50 MG tablet Take 1 every 4-6 hours as needed for pain not controlled by Tylenol 6 tablet 0  . [DISCONTINUED] potassium chloride (K-DUR,KLOR-CON) 10 MEQ tablet TAKE 2 TABLETS BY MOUTH DAILY 60 tablet 0   No current facility-administered medications on file prior to visit.     Allergies  Allergen Reactions  . Amoxicillin Anaphylaxis  . Penicillins Anaphylaxis  . Tolectin [Tolmetin Sodium] Anaphylaxis    Took at same time as amoxicillin when she had anaphylactic reaction.  . Tolmetin Anaphylaxis  . Ciprofloxacin Rash    Entire body looked as if sunburned, then skin peeled off.  . Statins Other (See Comments)    Leg pain  . Ioxaglate Hives    Patient had severe hives after Myelogram.  No issues breathing.  Returned for  a nerve root injection, was given Benadryl, mentioned some minor itching to daughter on the way home but no further issues. Was given a Benadryl today for this injection.   . Other Other (See Comments)  . Prolactin   . Bactrim [Sulfamethoxazole-Trimethoprim] Rash    ? Early rash on palms  . Codeine Nausea And Vomiting  .  Contrast Media [Iodinated Diagnostic Agents] Hives    Patient had severe hives after Myelogram.  No issues breathing.  Returned for  a nerve root injection,  was given Benadryl, mentioned some minor itching to daughter on the way home but no further issues. Was given a Benadryl today for this injection.   . Imipenem Rash  . Megestrol Rash  . Procardia [Nifedipine] Other (See Comments)    Causes aches down legs  . Vancomycin Rash    Past Medical History:  Diagnosis Date  . Allergy   . Anxiety   . Arthritis    osteoarthritis  . Back problem   . CAD (coronary artery disease)    a. admx with CP c/w [place] 4/14 => LHC: pLAD 40%, RCA with lum irregs => med Rx  . 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 Depression   . Diverticulosis   . Endocarditis 10/18/2010   treated with vancomycin, gentamicin, and Cipro  . Hemorrhoids, internal   . HLD (hyperlipidemia)   . Hypertension   . Hypokalemia   . Insomnia   . Renal insufficiency 11/29/2010  . Septic embolism (HCC) 10/18/2010   endocarditis -- per TEE  . Vertigo   . Wears dentures    partial lower. upper doesn't fit    Past Surgical History:  Procedure Laterality Date  . ABDOMINAL HYSTERECTOMY  1979   full  . bladder tuck  2007?  Marland Kitchen BROW LIFT Bilateral 03/06/2016   Procedure: BLEPHAROPLASTY REPAIR, RESECT EX;  Surgeon: Imagene Riches, MD;  Location: [facility];  Service: Ophthalmology;  Laterality: Bilateral;  . CHOLECYSTECTOMY  09/29/1999  . LEFT HEART CATHETERIZATION WITH CORONARY ANGIOGRAM N/A 05/23/2012   Procedure: LEFT HEART CATHETERIZATION WITH CORONARY ANGIOGRAM;  Surgeon: Rollene Rotunda, MD;  Location: [facility];  Service: Cardiovascular;  Laterality: N/A;  . TEE WITHOUT CARDIOVERSION  10/10/2010    done with suggestion of small mitral vegetation.  ID was consulted for input and ID feels  patient with possible endocarditis with septic emboli    Social History   Tobacco Use  Smoking Status  Never Smoker  Smokeless Tobacco Never Used    Social History   Substance and Sexual Activity  Alcohol Use No    Family History  Problem Relation Age of Onset  . Coronary artery disease Unknown        family history of  . Colon cancer Unknown        family history of  . Cerebral palsy Sister   . Colon cancer Brother   . Prostate cancer Brother     Reviw of Systems:  Reviewed in the HPI.  All other systems are negative.  Physical Exam: There were no vitals taken for this visit.  GEN:  Well nourished, well developed in no acute distress HEENT: Normal NECK: No JVD; No carotid bruits LYMPHATICS: No lymphadenopathy CARDIAC: RRR  2/6 systolic murmur  RESPIRATORY:  Clear to auscultation without rales, wheezing or rhonchi  ABDOMEN: Soft, non-tender, non-distended MUSCULOSKELETAL:  No edema; No deformity  SKIN: Warm and dry NEUROLOGIC:  Alert and oriented x 3  ECG: Jan. 7, 2019 Normal sinus rhythm at 87.  Previous inferior wall myocardial infarction.  Previous anterior wall myocardial infarction with associated ST and T wave changes.  Assessment / Plan:   1. Endocarditis -   S/p MV endocarditis , there is no evidence of recurrent vegetation.  She seems to be asymptomatic.  Given her age and the fact that she is doing so well, I do not think that we need to do any further investigations or consider replacing the valve.  2.  Septic emboli leading  to stroke  3.   TIA -     4. Essential HTN:    BP is well controlled.   5. Hyperlipidemia :    6.  Question of  MI :   Has Q waves in Inf. And ant leads. This likely occurred in 2012 when she had a showed of emboli from her MV vegetation - which also caused a stroke.    Kristeen Miss, MD  02/25/2017 10:22 AM    [facility] [address] [place],  [address] [place], [place]  [address] Pager [phone] Phone: [phone]; Fax: [phone]

## 2017-02-25 NOTE — Patient Instructions (Signed)
Medication Instructions:  Your physician recommends that you continue on your current medications as directed. Please refer to the Current Medication list given to you today.   Labwork: None Ordered   Testing/Procedures: None Ordered   Follow-Up: Your physician wants you to follow-up in: 1 year with Dr. Nahser.  You will receive a reminder letter in the mail two months in advance. If you don't receive a letter, please call our office to schedule the follow-up appointment.   If you need a refill on your cardiac medications before your next appointment, please call your pharmacy.   Thank you for choosing [facility]! Naiomi Musto, RN [phone]    

## 2017-03-20 NOTE — Progress Notes (Signed)
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, presented with confusion, delirium, gait difficulty, TEE showed ejection fraction 65%, calcified mitral valve, no vegetation, was diagnosed with a septic endocarditis, was treated with prolonged antibiotics, antiplatelet agent, also with non-ST elevation MI, she also had past medical history of hypertension, anxiety, hyperlipidemia, she had a high school graduate, lives at home with her husband,  Ever since that event she has become much less active, only driving occasionally at short distance, also has short-term memory trouble, occasionally confusion, no longer cooking, she also complains of chronic low back pain, long-standing history of depression anxiety, she is taking Valium 5 mg 3 times a day, her husband has taken over her prescription medication management, but she tends to take a lot of supplements, as needed Tylenol, ibuprofen,  In May 20 fourth 2014, she woke up was noticed by her family to have confusion, unsteady gait, lasting for 24 hours, no seizure activity was noted, she was admitted to the hospital, May 24th through 27th, 2014.  MRI head again showed acute infarction: Two or three punctate acute infarctions in the left frontal region affecting the caudate head and left frontal lobe.  No swelling, hemorrhage or mass effect.  Moderate chronic small vessel changes elsewhere throughout the brain.    MRA HEAD: No major vessel occlusion or correctable proximal stenosis. Repeat echocardiogram showed ejection fraction  65%, calcified mitral valve, no vegetations, EEG was normal, she is not back to her normal self, very argumentary, especially to her daughter. Ultrasound of carotid artery showed no large vessel disease  Mother died age 107 from stroke, father died age 33 from heart attack, no family history of dementia  UPDATE Sept 28th 2015:YY Her husband is helping with her medications, she is alone at visit, she is overall doing well, thinking she is improving. She works in her garden a lot, still active at home, cooking Sunday lunch for her extended family.  She has no trouble walking, she fell sometimes when turning quickly  UPDATE Mar 21 2015: Frances Mendoza is with her daughter at visit, she had bad cold in Dec 2016, take few weeks to recover, she complains of gradual worsening memory loss, but she still very active, she still drives, no gait difficulties, she has chronic insomnia, taking Valium 5 mg half to one tablets every night as needed UPDATE 01/30/2018[name], 82 year old female returns for follow-up with her daughter. She has a history of memory loss and stroke event in 2014. She is currently on Plavix for secondary stroke prevention with minimal bruising and bleeding. Her primary care follows her risk factors. She also has a history of memory loss and is currently on Aricept and Namenda without side effects to the medication. She had her eyelids lifted 03/06/2016 because they were interfering with her vision. She claims she fell a couple weeks ago in the snow. No apparent injury. She continues to live in her own home, does some cooking but not much. No safety issues identified. She continues to be independent in her activities of daily living she returns for reevaluation  UPDATE UPDATE 1/31/2019[name], 82 year old female returns for follow-up with her daughter with history of stroke event in 2014 and memory loss.  She was on Plavix when last seen however due to excessive bruising she stopped the  medication and is only taking aspirin.  She has minimal bruising and no bleeding and no further stroke or TIA symptoms.  She is currently on Aricept and Namenda for her memory tolerating both without side effects.  She continues to live in her own home with her husband occasionally cooks but most of the time the daughters bring in food or she and her husband go out she continues to be independent in activities of daily living.  She no longer drives no safety issues identified.  She has a long history of depression and takes Valium as needed.  She was encouraged to socialize at the senior center and her area however she has no interest in that.  She denies any recent falls she does not use an assistive device.  She has history of chronic low back pain and has been told she needs surgery however she refuses.  She returns for reevaluation REVIEW OF SYSTEMS: Full 14 system review of systems performed and notable only for those listed, all others are neg:  Constitutional: Fatigue Cardiovascular: neg Ear/Nose/Throat: neg  Skin: neg Eyes: neg Respiratory: neg Gastroitestinal: neg  Hematology/Lymphatic: neg  Endocrine: neg Muscoskeletal Gait abnormality Allergy/Immunology: neg NeuroMemory loss, history of stroke Psychiatric: Depression anxiety Sleep : neg   ALLERGIES: Allergies  Allergen Reactions  . Amoxicillin Anaphylaxis  . Penicillins Anaphylaxis  . Tolectin [Tolmetin Sodium] Anaphylaxis    Took at same time as amoxicillin when she had anaphylactic reaction.  . Tolmetin Anaphylaxis  . Ciprofloxacin Rash    Entire body looked as if sunburned, then skin peeled off.  . Statins Other (See Comments)    Leg pain  . Ioxaglate Hives    Patient had severe hives after Myelogram.  No issues breathing.  Returned for  a nerve root injection, was given Benadryl, mentioned some minor itching to daughter on the way home but no further issues. Was given a Benadryl today for this injection.   . Other Other  (See Comments)  . Prolactin   . Bactrim [Sulfamethoxazole-Trimethoprim] Rash    ? Early rash on palms  . Codeine Nausea And Vomiting  . Contrast Media [Iodinated Diagnostic Agents] Hives    Patient had severe hives after Myelogram.  No issues breathing.  Returned for  a nerve root injection, was given Benadryl, mentioned some minor itching to daughter on the way home but no further issues. Was given a Benadryl today for this injection.   . Imipenem Rash  . Megestrol Rash  . Procardia [Nifedipine] Other (See Comments)    Causes aches down legs  . Vancomycin Rash    HOME MEDICATIONS: Outpatient Medications Prior to Visit  Medication Sig Dispense Refill  . albuterol (PROVENTIL HFA;VENTOLIN HFA) 108 (90 BASE) MCG/ACT inhaler Inhale 2 puffs into the lungs every 6 (six) hours as needed for wheezing or shortness of breath.    . Ascorbic Acid (VITAMIN C PO) Take by mouth daily.    Marland Kitchen azelastine (ASTELIN) 0.1 % nasal spray Place into both nostrils daily as needed for rhinitis. Use in each nostril as directed    . benzonatate (TESSALON) 200 MG capsule TK 1 C PO TID prn  0  . CINNAMON PO Take by mouth daily.    . Cyanocobalamin (VITAMIN B12 PO) Take  by mouth daily.    . diazepam (VALIUM) 5 MG tablet Take 5 mg by mouth every 8 (eight) hours as needed for anxiety. 3 daily    . donepezil (ARICEPT) 10 MG tablet Take 1 tablet (10 mg total) by mouth daily. 90 tablet 3  . fluticasone (FLONASE) 50 MCG/ACT nasal spray Place 1 spray into the nose 2 (two) times daily.      . hydrochlorothiazide (HYDRODIURIL) 25 MG tablet TAKE 1 TABLET BY MOUTH EVERY DAY 90 tablet 0  . Magnesium 250 MG TABS Take by mouth daily.    . meclizine (ANTIVERT) 25 MG tablet Take 25 mg by mouth 3 (three) times daily as needed for dizziness.    . memantine (NAMENDA) 10 MG tablet Take 1 tablet (10 mg total) by mouth 2 (two) times daily. 180 tablet 3  . Misc Natural Products (TART CHERRY ADVANCED PO) Take by mouth daily.    .  nitroGLYCERIN (NITROSTAT) 0.4 MG SL tablet Place 1 tablet (0.4 mg total) under the tongue every 5 (five) minutes x 3 doses as needed for chest pain. 25 tablet 11  . potassium chloride (KLOR-CON 10) 10 MEQ tablet TAKE 2 TABLETS BY MOUTH DAILY 180 tablet 3  . Vitamin D, Ergocalciferol, (DRISDOL) 50000 units CAPS capsule Take 1 capsule by mouth every 30 (thirty) days.      No facility-administered medications prior to visit.     PAST MEDICAL HISTORY: Past Medical History:  Diagnosis Date  . Allergy   . Anxiety   . Arthritis    osteoarthritis  . Back problem   . CAD (coronary artery disease)    a. [place] => LHC: pLAD 40%, RCA with lum irregs => med Rx  . 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 Depression   . Diverticulosis   . Endocarditis 10/18/2010   treated with vancomycin, gentamicin, and Cipro  . Hemorrhoids, internal   . HLD (hyperlipidemia)   . Hypertension   . Hypokalemia   . Insomnia   . Renal insufficiency 11/29/2010  . Septic embolism (HCC) 10/18/2010   endocarditis -- per TEE  . Vertigo   . Wears dentures    partial lower. upper doesn't fit    PAST SURGICAL HISTORY: Past Surgical History:  Procedure Laterality Date  . ABDOMINAL HYSTERECTOMY  1979   full  . bladder tuck  2007?  Marland Kitchen. BROW LIFT Bilateral 03/06/2016   Procedure: BLEPHAROPLASTY REPAIR, RESECT EX;  Surgeon: Imagene RichesAmy M Fowler, MD;  [facility];  Service: Ophthalmology;  Laterality: Bilateral;  . CHOLECYSTECTOMY  09/29/1999  . LEFT HEART CATHETERIZATION WITH CORONARY ANGIOGRAM N/A 05/23/2012   Procedure: LEFT HEART CATHETERIZATION WITH CORONARY ANGIOGRAM;  Surgeon: Rollene RotundaJames Hochrein, MD;  [facility];  Service: Cardiovascular;  Laterality: N/A;  . TEE WITHOUT CARDIOVERSION  10/10/2010    done with suggestion of small mitral vegetation.  ID was consulted for input and ID feels  patient with possible endocarditis with septic emboli    FAMILY  HISTORY: Family History  Problem Relation Age of Onset  . Coronary artery disease Unknown        family history of  . Colon cancer Unknown        family history of  . Cerebral palsy Sister   . Colon cancer Brother   . Prostate cancer Brother     SOCIAL HISTORY: Social History   Socioeconomic History  . Marital status: Married    Spouse name:  Frances Mendoza  . Number of children: 3  . Years of education: 1  . Highest education level: Not on file  Social Needs  . Financial resource strain: Not on file  . Food insecurity - worry: Not on file  . Food insecurity - inability: Not on file  . Transportation needs - medical: Not on file  . Transportation needs - non-medical: Not on file  Occupational History    Comment: patient not employed  . Occupation: Retired  Tobacco Use  . Smoking status: Never Smoker  . Smokeless tobacco: Never Used  Substance and Sexual Activity  . Alcohol use: No  . Drug use: No  . Sexual activity: Not Currently  Other Topics Concern  . Not on file  Social History Narrative   Patient lives at home with spouse. Frances Mendoza   Caffeine Use: 2 cups daily   Patient has 3 children.    Patient is retired.              PHYSICAL EXAM  Vitals:   03/21/17 1500  BP: 134/72  Pulse: 84  Weight: 149 lb 12.8 oz (67.9 kg)  Height: 5\' 3"  (1.6 m)   Body mass index is 26.54 kg/m.  Generalized: Well developed, in no acute distress  Head: normocephalic and atraumatic,. Oropharynx benign  Neck: Supple, no carotid bruits  Cardiac: Regular rate rhythm, no murmur  Muscloskeletal Arthritic changes in fingers Neurological examination   Mentation: Alert AFT 11. Clock drawing 4/4 MMSE - Mini Mental State Exam 03/21/2017 03/20/2016 03/21/2015  Orientation to time 5 5 5   Orientation to Place 5 5 5   Registration 3 3 3   Attention/ Calculation 4 5 5   Recall 1 2 2   Language- name 2 objects 2 2 2   Language- repeat 1 1 1   Language- follow 3 step command 2 3 3   Language-  read & follow direction 1 1 1   Write a sentence 1 1 1   Copy design 1 1 1   Total score 26 29 29    Follows all commands speech and language fluent.    Cranial nerve II-XII: Pupils were equal round reactive to light extraocular movements were full, visual field were full on confrontational test. Facial sensation and strength were normal. hearing was intact to finger rubbing bilaterally. Uvula tongue midline. head turning and shoulder shrug were normal and symmetric.Tongue protrusion into cheek strength was normal. Motor: normal bulk and tone, full strength in the BUE, BLE, fine finger movements normal, no pronator drift.  Sensory: normal and symmetric to light touch,  Coordination: finger-nose-finger, heel-to-shin bilaterally, no dysmetria Reflexes: Brachioradialis 2/2, biceps 2/2, triceps 2/2, patellar 2/2, Achilles 2/2, plantar responses were flexor bilaterally. Gait and Station: Rising up from seated position without assistance, normal stance,  moderate stride, good arm swing, smooth turning, able to perform tiptoe, and heel walking without difficulty. Tandem gait is unsteady. No assistive device   DIAGNOSTIC DATA (LABS, IMAGING, TESTING) - Reviewed Lipid profile from 08/16/16 in care everywhere Trig 99 Cholestrol 231 LDL 150 CBC WNL on 12/11/16 CMP WNL on 12/11/16     ASSESSMENT AND PLAN  82 y.o. year old female  has a past medical history of  Cerebrovascular accident, embolic (HCC) (10/18/2010); Depression; (hyperlipidemia); Hypertension;  Renal insufficiency (11/29/2010); and memory loss here to follow-up.       PLAN:  Continue Aricept 10 mg daily will refill Continue Namenda 10 mg twice daily Will refill Follow up 6 to 8 months Recent LDL 150 she has failed statin drugs in  the past due to leg cramps and muscle weakness She has stopped her Plavix due to excessive bruising but is taking aspirin for secondary stroke prevention Continue moderate exercise and low-fat diet No  safety measures identified  Encouraged chair exercises or Silver sneakers, importance of exercise to decrease risk of falling.  This would also help her depression Greater than 50% of time during this 25 minute visit was spent on counseling,explanation of diagnosis, planning of further management, discussion with patient and daughter  and coordination of care Nilda Riggs, [facility], [facility], APRN  [facility] [address], [address] [place], [place] [address] [phone]

## 2017-03-21 ENCOUNTER — Encounter: Payer: Self-pay | Admitting: Nurse Practitioner

## 2017-03-21 ENCOUNTER — Ambulatory Visit (INDEPENDENT_AMBULATORY_CARE_PROVIDER_SITE_OTHER): Payer: Medicare Other | Admitting: Nurse Practitioner

## 2017-03-21 VITALS — BP 134/72 | HR 84 | Ht 63.0 in | Wt 149.8 lb

## 2017-03-21 DIAGNOSIS — R413 Other amnesia: Secondary | ICD-10-CM | POA: Diagnosis not present

## 2017-03-21 DIAGNOSIS — Z8673 Personal history of transient ischemic attack (TIA), and cerebral infarction without residual deficits: Secondary | ICD-10-CM

## 2017-03-21 MED ORDER — MEMANTINE HCL 10 MG PO TABS
10.0000 mg | ORAL_TABLET | Freq: Two times a day (BID) | ORAL | 3 refills | Status: DC
Start: 2017-03-21 — End: 2017-09-23

## 2017-03-21 MED ORDER — DONEPEZIL HCL 10 MG PO TABS: 10.0000 mg | ORAL_TABLET | Freq: Every day | ORAL | 3 refills | Status: DC

## 2017-03-21 NOTE — Patient Instructions (Signed)
Continue Aricept 10 mg daily will refill Continue Namenda 10 mg twice daily Will refill Follow up 6 to 8 months

## 2017-03-22 NOTE — Progress Notes (Signed)
I have reviewed and agreed above plan. 

## 2017-05-29 ENCOUNTER — Other Ambulatory Visit: Payer: Self-pay | Admitting: Cardiovascular Disease

## 2017-05-29 MED ORDER — HYDROCHLOROTHIAZIDE 25 MG PO TABS: 25.0000 mg | ORAL_TABLET | Freq: Every day | ORAL | 2 refills | Status: DC

## 2017-06-12 ENCOUNTER — Telehealth: Payer: Self-pay | Admitting: Nurse Practitioner

## 2017-06-12 MED ORDER — DONEPEZIL HCL 10 MG PO TABS: 10.0000 mg | ORAL_TABLET | Freq: Every day | ORAL | 2 refills | Status: DC

## 2017-06-12 NOTE — Telephone Encounter (Signed)
Frances CornfieldStephanie with [facility] [place] requesting refill for donepezil (ARICEPT) 10 MG tablet. She does not see Rx sent on 03-21-17 with 3 refills.

## 2017-06-12 NOTE — Addendum Note (Signed)
Addended by: Hermenia FiscalYOUNG, SANDRA S on: 06/12/2017 11:20 AM   Modules accepted: Orders

## 2017-06-12 NOTE — Telephone Encounter (Signed)
Done

## 2017-06-12 NOTE — Telephone Encounter (Signed)
Patient called to check status of this medication. I informed her we had received the refill request and our nurse had taken care of it. No need to return call.

## 2017-09-20 NOTE — Progress Notes (Signed)
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, presented with confusion, delirium, gait difficulty, TEE showed ejection fraction 65%, calcified mitral valve, no vegetation, was diagnosed with a septic endocarditis, was treated with prolonged antibiotics, antiplatelet agent, also with non-ST elevation MI, she also had past medical history of hypertension, anxiety, hyperlipidemia, she had a high school graduate, lives at home with her husband,  Ever since that event she has become much less active, only driving occasionally at short distance, also has short-term memory trouble, occasionally confusion, no longer cooking, she also complains of chronic low back pain, long-standing history of depression anxiety, she is taking Valium 5 mg 3 times a day, her husband has taken over her prescription medication management, but she tends to take a lot of supplements, as needed Tylenol, ibuprofen,  In May 20 fourth 2014, she woke up was noticed by her family to have confusion, unsteady gait, lasting for 24 hours, no seizure activity was noted, she was admitted to the hospital, May 24th through 27th, 2014.  MRI head again showed acute infarction: Two or three punctate acute infarctions in the left frontal region affecting the caudate head and left frontal lobe.  No swelling, hemorrhage or mass effect.  Moderate chronic small vessel changes elsewhere throughout the brain.    MRA HEAD: No major vessel occlusion or correctable proximal stenosis. Repeat echocardiogram showed ejection fraction  65%, calcified mitral valve, no vegetations, EEG was normal, she is not back to her normal self, very argumentary, especially to her daughter. Ultrasound of carotid artery showed no large vessel disease  Mother died age 107 from stroke, father died age 33 from heart attack, no family history of dementia  UPDATE Sept 28th 2015:YY Her husband is helping with her medications, she is alone at visit, she is overall doing well, thinking she is improving. She works in her garden a lot, still active at home, cooking Sunday lunch for her extended family.  She has no trouble walking, she fell sometimes when turning quickly  UPDATE Mar 21 2015: [name] is with her daughter at visit, she had bad cold in Dec 2016, take few weeks to recover, she complains of gradual worsening memory loss, but she still very active, she still drives, no gait difficulties, she has chronic insomnia, taking Valium 5 mg half to one tablets every night as needed UPDATE 01/30/2018CM [name], 82 year old female returns for follow-up with her daughter. She has a history of memory loss and stroke event in 2014. She is currently on Plavix for secondary stroke prevention with minimal bruising and bleeding. Her primary care follows her risk factors. She also has a history of memory loss and is currently on Aricept and Namenda without side effects to the medication. She had her eyelids lifted 03/06/2016 because they were interfering with her vision. She claims she fell a couple weeks ago in the snow. No apparent injury. She continues to live in her own home, does some cooking but not much. No safety issues identified. She continues to be independent in her activities of daily living she returns for reevaluation  UPDATE UPDATE 1/31/2019CM [name], 82 year old female returns for follow-up with her daughter with history of stroke event in 2014 and memory loss.  She was on Plavix when last seen however due to excessive bruising she stopped the  medication and is only taking aspirin.  She has minimal bruising and no bleeding and no further stroke or TIA symptoms.  She is currently on Aricept and Namenda for her memory tolerating both without side effects.  She continues to live in her own home with her husband occasionally cooks but most of the time the daughters bring in food or she and her husband go out she continues to be independent in activities of daily living.  She no longer drives no safety issues identified.  She has a long history of depression and takes Valium as needed.  She was encouraged to socialize at the senior center and her area however she has no interest in that.  She denies any recent falls she does not use an assistive device.  She has history of chronic low back pain and has been told she needs surgery however she refuses.  She returns for reevaluation UPDATE 8/5/2019CM [name], 82 year old female returns for follow-up with her daughter history of stroke in 2014 and memory loss.  She stopped her Plavix and is on aspirin for secondary stroke prevention.  She has minimal bruising and no bleeding.  She has not had further stroke or TIA symptoms.  Her memory is stable.  She is currently on Aricept and Namenda without side effects.  She continues to be active and independent in activities of daily living she no longer drives.  She has a long history of depression and continues to take Valium daily no recent fall she does not use an assistive device.  She returns for reevaluation REVIEW OF SYSTEMS: Full 14 system review of systems performed and notable only for those listed, all others are neg:  Constitutional: Fatigue Cardiovascular: neg Ear/Nose/Throat: neg  Skin: neg Eyes: neg Respiratory: neg Gastroitestinal: neg  Hematology/Lymphatic: neg  Endocrine: neg Muscoskeletal Gait abnormality, back pain has refused surgery Allergy/Immunology: Environmental allergies NeuroMemory loss, history of stroke Psychiatric: Depression  anxiety Sleep : neg   ALLERGIES: Allergies  Allergen Reactions  . Amoxicillin Anaphylaxis  . Penicillins Anaphylaxis  . Tolectin [Tolmetin Sodium] Anaphylaxis    Took at same time as amoxicillin when she had anaphylactic reaction.  . Tolmetin Anaphylaxis  . Ciprofloxacin Rash    Entire body looked as if sunburned, then skin peeled off.  . Statins Other (See Comments)    Leg pain  . Ioxaglate Hives    Patient had severe hives after Myelogram.  No issues breathing.  Returned for  a nerve root injection, was given Benadryl, mentioned some minor itching to daughter on the way home but no further issues. Was given a Benadryl today for this injection.   . Other Other (See Comments)  . Prolactin   . Bactrim [Sulfamethoxazole-Trimethoprim] Rash    ? Early rash on palms  . Codeine Nausea And Vomiting  . Contrast Media [Iodinated Diagnostic Agents] Hives    Patient had severe hives after Myelogram.  No issues breathing.  Returned for  a nerve root injection, was given Benadryl, mentioned some minor itching to daughter on the way home but no further issues. Was given a Benadryl today for this injection.   . Imipenem Rash  . Megestrol Rash  . Procardia [Nifedipine] Other (See Comments)    Causes aches down legs  .  Vancomycin Rash    HOME MEDICATIONS: Outpatient Medications Prior to Visit  Medication Sig Dispense Refill  . albuterol (PROVENTIL HFA;VENTOLIN HFA) 108 (90 BASE) MCG/ACT inhaler Inhale 2 puffs into the lungs every 6 (six) hours as needed for wheezing or shortness of breath.    . Ascorbic Acid (VITAMIN C PO) Take by mouth daily.    Marland Kitchen azelastine (ASTELIN) 0.1 % nasal spray Place into both nostrils daily as needed for rhinitis. Use in each nostril as directed    . benzonatate (TESSALON) 200 MG capsule TK 1 C PO TID prn  0  . CINNAMON PO Take by mouth daily.    . Cyanocobalamin (VITAMIN B12 PO) Take by mouth daily.    . diazepam (VALIUM) 5 MG tablet Take 5 mg by mouth every 8  (eight) hours as needed for anxiety. 3 daily    . donepezil (ARICEPT) 10 MG tablet Take 1 tablet (10 mg total) by mouth daily. 90 tablet 2  . fluticasone (FLONASE) 50 MCG/ACT nasal spray Place 1 spray into the nose 2 (two) times daily.      . hydrochlorothiazide (HYDRODIURIL) 25 MG tablet Take 1 tablet (25 mg total) by mouth daily. 90 tablet 2  . Magnesium 250 MG TABS Take by mouth daily.    . meclizine (ANTIVERT) 25 MG tablet Take 25 mg by mouth 3 (three) times daily as needed for dizziness.    . memantine (NAMENDA) 10 MG tablet Take 1 tablet (10 mg total) by mouth 2 (two) times daily. 180 tablet 3  . Misc Natural Products (TART CHERRY ADVANCED PO) Take by mouth daily.    . nitroGLYCERIN (NITROSTAT) 0.4 MG SL tablet Place 1 tablet (0.4 mg total) under the tongue every 5 (five) minutes x 3 doses as needed for chest pain. 25 tablet 11  . potassium chloride (KLOR-CON 10) 10 MEQ tablet TAKE 2 TABLETS BY MOUTH DAILY 180 tablet 3  . Vitamin D, Ergocalciferol, (DRISDOL) 50000 units CAPS capsule Take 1 capsule by mouth every 30 (thirty) days.      No facility-administered medications prior to visit.     PAST MEDICAL HISTORY: Past Medical History:  Diagnosis Date  . Allergy   . Anxiety   . Arthritis    osteoarthritis  . Back problem   . CAD (coronary artery disease)    a. admx with CP c/w [place] 4/14 => LHC: pLAD 40%, RCA with lum irregs => med Rx  . 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 Depression   . Diverticulosis   . Endocarditis 10/18/2010   treated with vancomycin, gentamicin, and Cipro  . Hemorrhoids, internal   . HLD (hyperlipidemia)   . Hypertension   . Hypokalemia   . Insomnia   . Renal insufficiency 11/29/2010  . Septic embolism (HCC) 10/18/2010   endocarditis -- per TEE  . Vertigo   . Wears dentures    partial lower. upper doesn't fit    PAST SURGICAL HISTORY: Past Surgical History:  Procedure Laterality Date  . ABDOMINAL HYSTERECTOMY  1979     full  . bladder tuck  2007?  Marland Kitchen BROW LIFT Bilateral 03/06/2016   Procedure: BLEPHAROPLASTY REPAIR, RESECT EX;  Surgeon: Imagene Riches, MD;  Location: [facility];  Service: Ophthalmology;  Laterality: Bilateral;  . CHOLECYSTECTOMY  09/29/1999  . LEFT HEART CATHETERIZATION WITH CORONARY ANGIOGRAM N/A 05/23/2012   Procedure: LEFT HEART CATHETERIZATION WITH CORONARY ANGIOGRAM;  Surgeon: Rollene Rotunda, MD;  Location: [facility];  Service:  Cardiovascular;  Laterality: N/A;  . TEE WITHOUT CARDIOVERSION  10/10/2010    done with suggestion of small mitral vegetation.  ID was consulted for input and ID feels  patient with possible endocarditis with septic emboli    FAMILY HISTORY: Family History  Problem Relation Age of Onset  . Coronary artery disease Unknown        family history of  . Colon cancer Unknown        family history of  . Cerebral palsy Sister   . Colon cancer Brother   . Prostate cancer Brother     SOCIAL HISTORY: Social History   Socioeconomic History  . Marital status: Married    Spouse name: Dimas AguasHoward  . Number of children: 3  . Years of education: 1612  . Highest education level: Not on file  Occupational History    Comment: patient not employed  . Occupation: Retired  Engineer, productionocial Needs  . Financial resource strain: Not on file  . Food insecurity:    Worry: Not on file    Inability: Not on file  . Transportation needs:    Medical: Not on file    Non-medical: Not on file  Tobacco Use  . Smoking status: Never Smoker  . Smokeless tobacco: Never Used  Substance and Sexual Activity  . Alcohol use: No  . Drug use: No  . Sexual activity: Not Currently  Lifestyle  . Physical activity:    Days per week: Not on file    Minutes per session: Not on file  . Stress: Not on file  Relationships  . Social connections:    Talks on phone: Not on file    Gets together: Not on file    Attends religious service: Not on file    Active member of club or organization: Not on  file    Attends meetings of clubs or organizations: Not on file    Relationship status: Not on file  . Intimate partner violence:    Fear of current or ex partner: Not on file    Emotionally abused: Not on file    Physically abused: Not on file    Forced sexual activity: Not on file  Other Topics Concern  . Not on file  Social History Narrative   Patient lives at home with spouse. Howard   Caffeine Use: 2 cups daily   Patient has 3 children.    Patient is retired.              PHYSICAL EXAM  Vitals:   09/23/17 1003  BP: 123/66  Pulse: 72  Weight: 139 lb (63 kg)  Height: 5\' 3"  (1.6 m)   Body mass index is 24.62 kg/m.  Generalized: Well developed, in no acute distress  Head: normocephalic and atraumatic,. Oropharynx benign  Neck: Supple, no carotid bruits  Cardiac: Regular rate rhythm, no murmur  Muscloskeletal Arthritic changes in fingers Neurological examination   Mentation: Alert AFT 11. Clock drawing 4/4 MMSE - Mini Mental State Exam 09/23/2017 03/21/2017 03/20/2016  Orientation to time 4 5 5   Orientation to Place 5 5 5   Registration 3 3 3   Attention/ Calculation 5 4 5   Recall 1 1 2   Language- name 2 objects 2 2 2   Language- repeat 1 1 1   Language- follow 3 step command 3 2 3   Language- read & follow direction 1 1 1   Write a sentence 1 1 1   Copy design 1 1 1   Total score 27 26 29  Follows all commands speech and language fluent.    Cranial nerve II-XII: Pupils were equal round reactive to light extraocular movements were full, visual field were full on confrontational test. Facial sensation and strength were normal. hearing was intact to finger rubbing bilaterally. Uvula tongue midline. head turning and shoulder shrug were normal and symmetric.Tongue protrusion into cheek strength was normal. Motor: normal bulk and tone, full strength in the BUE, BLE, Sensory: normal and symmetric to light touch,  Coordination: finger-nose-finger, heel-to-shin bilaterally,  no dysmetria Reflexes: Brachioradialis 2/2, biceps 2/2, triceps 2/2, patellar 2/2, Achilles 2/2, plantar responses were flexor bilaterally. Gait and Station: Rising up from seated position without assistance, normal stance,  moderate stride, good arm swing, smooth turning, able to perform tiptoe, and heel walking without difficulty. Tandem gait is unsteady. No assistive device   DIAGNOSTIC DATA (LABS, IMAGING, TESTING) - Reviewed Lipid profile from 05/14/17 in care everywhere [phone] Cholestrol 250 LDL 152 HDL 68 CBC WNL  CMP WNL      ASSESSMENT AND PLAN  82 y.o. year old female  has a past medical history of  Cerebrovascular accident, embolic (HCC) (10/18/2010); Depression; (hyperlipidemia); Hypertension;  Renal insufficiency (11/29/2010); and memory loss here to follow-up.       PLAN:  Continue Aricept 10 mg daily will refill Continue Namenda 10 mg twice daily Will refill Continue aspirin for secondary stroke prevention Continue to be active Follow up 1 year Recent LDL 152 she has failed statin drugs in the past due to leg cramps and muscle weakness She has stopped her Plavix due to excessive bruising but is taking aspirin for secondary stroke prevention Continue moderate exercise and low-fat diet No safety measures identified  Greater than 50% of time during this 25 minute visit was spent on counseling,explanation of diagnosis, planning of further management, reviewing lab values ,discussion with patient and daughter  and coordination of care Nilda Riggs, [facility], [facility], APRN  [facility] [address], [address] [place], [place] [address] [phone]

## 2017-09-23 ENCOUNTER — Ambulatory Visit (INDEPENDENT_AMBULATORY_CARE_PROVIDER_SITE_OTHER): Payer: Medicare Other | Admitting: Nurse Practitioner

## 2017-09-23 ENCOUNTER — Encounter: Payer: Self-pay | Admitting: Nurse Practitioner

## 2017-09-23 VITALS — BP 123/66 | HR 72 | Ht 63.0 in | Wt 139.0 lb

## 2017-09-23 DIAGNOSIS — E785 Hyperlipidemia, unspecified: Secondary | ICD-10-CM | POA: Diagnosis not present

## 2017-09-23 DIAGNOSIS — Z8673 Personal history of transient ischemic attack (TIA), and cerebral infarction without residual deficits: Secondary | ICD-10-CM | POA: Diagnosis not present

## 2017-09-23 DIAGNOSIS — R413 Other amnesia: Secondary | ICD-10-CM | POA: Diagnosis not present

## 2017-09-23 MED ORDER — DONEPEZIL HCL 10 MG PO TABS: 10.0000 mg | ORAL_TABLET | Freq: Every day | ORAL | 3 refills | Status: DC

## 2017-09-23 MED ORDER — MEMANTINE HCL 10 MG PO TABS: 10.0000 mg | ORAL_TABLET | Freq: Two times a day (BID) | ORAL | 3 refills | Status: DC

## 2017-09-23 NOTE — Progress Notes (Signed)
I have reviewed and agreed above plan. 

## 2017-09-23 NOTE — Patient Instructions (Signed)
Continue Aricept 10 mg daily will refill Continue Namenda 10 mg twice daily Will refill Continue aspirin for secondary stroke prevention Continue to be active Follow up yearly

## 2018-02-27 ENCOUNTER — Other Ambulatory Visit: Payer: Self-pay | Admitting: Cardiovascular Disease

## 2018-03-26 ENCOUNTER — Other Ambulatory Visit: Payer: Self-pay | Admitting: Cardiovascular Disease

## 2018-05-15 ENCOUNTER — Other Ambulatory Visit: Payer: Self-pay | Admitting: Cardiovascular Disease

## 2018-05-21 ENCOUNTER — Encounter

## 2018-05-21 ENCOUNTER — Ambulatory Visit: Payer: Medicare Other | Admitting: Cardiovascular Disease

## 2018-09-29 ENCOUNTER — Ambulatory Visit: Payer: Medicare Other | Admitting: Neurology

## 2018-09-29 ENCOUNTER — Ambulatory Visit: Payer: Medicare Other | Admitting: Nurse Practitioner

## 2018-12-16 ENCOUNTER — Other Ambulatory Visit: Payer: Self-pay | Admitting: *Deleted

## 2018-12-16 MED ORDER — MEMANTINE HCL 10 MG PO TABS: 10.0000 mg | ORAL_TABLET | Freq: Two times a day (BID) | ORAL | 0 refills | Status: DC

## 2018-12-22 ENCOUNTER — Other Ambulatory Visit: Payer: Self-pay | Admitting: *Deleted

## 2018-12-22 MED ORDER — DONEPEZIL HCL 10 MG PO TABS: 10.0000 mg | ORAL_TABLET | Freq: Every day | ORAL | 3 refills | Status: DC

## 2019-02-17 ENCOUNTER — Encounter: Payer: Self-pay | Admitting: Neurology

## 2019-02-17 ENCOUNTER — Other Ambulatory Visit: Payer: Self-pay

## 2019-02-17 ENCOUNTER — Ambulatory Visit (INDEPENDENT_AMBULATORY_CARE_PROVIDER_SITE_OTHER): Payer: Medicare Other | Admitting: Neurology

## 2019-02-17 VITALS — BP 149/81 | HR 79 | Temp 97.1°F | Ht 63.0 in | Wt 145.0 lb

## 2019-02-17 DIAGNOSIS — Z8673 Personal history of transient ischemic attack (TIA), and cerebral infarction without residual deficits: Secondary | ICD-10-CM

## 2019-02-17 DIAGNOSIS — R413 Other amnesia: Secondary | ICD-10-CM

## 2019-02-17 MED ORDER — MEMANTINE HCL 10 MG PO TABS: 10.0000 mg | ORAL_TABLET | Freq: Two times a day (BID) | ORAL | 4 refills | Status: DC

## 2019-02-17 MED ORDER — DONEPEZIL HCL 10 MG PO TABS: 10.0000 mg | ORAL_TABLET | Freq: Every day | ORAL | 4 refills | Status: DC

## 2019-02-17 NOTE — Progress Notes (Signed)
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, presented with confusion, delirium, gait difficulty, TEE showed ejection fraction 65%, calcified mitral valve, no vegetation, was diagnosed with a septic endocarditis, was treated with prolonged antibiotics, antiplatelet agent, also with non-ST elevation MI, she also had past medical history of hypertension, anxiety, hyperlipidemia, she had a high school graduate, lives at home with her husband,  Ever since that event she has become much less active, only driving occasionally at short distance, also has short-term memory trouble, occasionally confusion, no longer cooking, she also complains of chronic low back pain, long-standing history of depression anxiety, she is taking Valium 5 mg 3 times a day, her husband has taken over her prescription medication management, but she tends to take a lot of supplements, as needed Tylenol, ibuprofen,  In May 20 fourth 2014, she woke up was noticed by her family to have confusion, unsteady gait, lasting for 24 hours, no seizure activity was noted, she was admitted to the hospital, May 24th through 27th, 2014.  MRI head again showed acute infarction: Two or three punctate acute infarctions in the left frontal region affecting the caudate head and left frontal lobe.  No swelling, hemorrhage or mass effect.  Moderate chronic small vessel changes elsewhere throughout the brain.    MRA HEAD: No major vessel occlusion or correctable proximal stenosis. Repeat echocardiogram showed ejection fraction 65%, calcified mitral valve, no vegetations, EEG was normal, she is not back to her normal self, very argumentary, especially  to her daughter. Ultrasound of carotid artery showed no large vessel disease  Mother died age 81 from stroke, father died age 81 from heart attack, no family history of dementia  UPDATE Sept 28th 2015: Her husband is helping with her medications, she is alone at visit, she is overall doing well, thinking she is improving. She works in her garden a lot, still active at home, cooking Sunday lunch for her extended family.  She has no trouble walking, she fell sometimes when turning quickly  UPDATE Mar 21 2015: She is with her daughter at visit, she had bad cold in Dec 2016, take few weeks to recover, she complains of gradual worsening memory loss, but she still very active, she still drives, no gait difficulties, she has chronic insomnia, taking Valium 5 mg half to one tablets every night as needed  Update February 17, 2019: She has been followed up by couple years, overall stable, still lives alone, driving, taking care of her household without much difficulty, today's Mini-Mental Status Examination 29 out of 30, she is taking Namenda 10 mg twice a day, and Aricept 10 mg daily  REVIEW OF SYSTEMS: Full 14 system review of systems performed and notable only for those listed, all others are neg:  As above  ALLERGIES: Allergies  Allergen Reactions  . Amoxicillin Anaphylaxis  . Penicillins Anaphylaxis  . Tolectin [Tolmetin Sodium] Anaphylaxis    Took at same time as amoxicillin when she had anaphylactic reaction.  . Tolmetin Anaphylaxis  . Ciprofloxacin Rash    Entire body looked as if sunburned, then skin peeled off.  . Statins Other (See Comments)    Leg pain  . Ioxaglate Hives    Patient had  severe hives after Myelogram.  No issues breathing.  Returned for  a nerve root injection, was given Benadryl, mentioned some minor itching to daughter on the way home but no further issues. Was given a Benadryl today for this injection.   . Other Other (See Comments)  . Prolactin   . Bactrim  [Sulfamethoxazole-Trimethoprim] Rash    ? Early rash on palms  . Codeine Nausea And Vomiting  . Contrast Media [Iodinated Diagnostic Agents] Hives    Patient had severe hives after Myelogram.  No issues breathing.  Returned for  a nerve root injection, was given Benadryl, mentioned some minor itching to daughter on the way home but no further issues. Was given a Benadryl today for this injection.   . Imipenem Rash  . Megestrol Rash  . Procardia [Nifedipine] Other (See Comments)    Causes aches down legs  . Vancomycin Rash    HOME MEDICATIONS: Outpatient Medications Prior to Visit  Medication Sig Dispense Refill  . albuterol (PROVENTIL HFA;VENTOLIN HFA) 108 (90 BASE) MCG/ACT inhaler Inhale 2 puffs into the lungs every 6 (six) hours as needed for wheezing or shortness of breath.    . Ascorbic Acid (VITAMIN C PO) Take by mouth daily.    Marland Kitchen azelastine (ASTELIN) 0.1 % nasal spray Place into both nostrils daily as needed for rhinitis. Use in each nostril as directed    . benzonatate (TESSALON) 200 MG capsule TK 1 C PO TID prn  0  . CINNAMON PO Take by mouth daily.    . Cyanocobalamin (VITAMIN B12 PO) Take by mouth daily.    . diazepam (VALIUM) 5 MG tablet Take 5 mg by mouth every 8 (eight) hours as needed for anxiety. 3 daily    . donepezil (ARICEPT) 10 MG tablet Take 1 tablet (10 mg total) by mouth daily. 90 tablet 3  . fluticasone (FLONASE) 50 MCG/ACT nasal spray Place 1 spray into the nose 2 (two) times daily.      . hydrochlorothiazide (HYDRODIURIL) 25 MG tablet Take 1 tablet (25 mg total) by mouth daily. Please make appt with Dr. Elease Hashimoto for future refills. 2nd attempt 90 tablet 1  . Magnesium 250 MG TABS Take by mouth daily.    . meclizine (ANTIVERT) 25 MG tablet Take 25 mg by mouth 3 (three) times daily as needed for dizziness.    . memantine (NAMENDA) 10 MG tablet Take 1 tablet (10 mg total) by mouth 2 (two) times daily. 180 tablet 0  . Misc Natural Products (TART CHERRY ADVANCED PO) Take  by mouth daily.    . nitroGLYCERIN (NITROSTAT) 0.4 MG SL tablet Place 1 tablet (0.4 mg total) under the tongue every 5 (five) minutes x 3 doses as needed for chest pain. 25 tablet 11  . potassium chloride (KLOR-CON 10) 10 MEQ tablet TAKE 2 TABLETS BY MOUTH DAILY 180 tablet 3   No facility-administered medications prior to visit.    PAST MEDICAL HISTORY: Past Medical History:  Diagnosis Date  . Allergy   . Anxiety   . Arthritis    osteoarthritis  . Back problem   . CAD (coronary artery disease)    a. admx with CP c/w [place] 4/14 => LHC: pLAD 40%, RCA with lum irregs => med Rx  . 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 Depression   . Diverticulosis   . Endocarditis 10/18/2010   treated with vancomycin, gentamicin, and Cipro  . Hemorrhoids, internal   . HLD (hyperlipidemia)   .  Hypertension   . Hypokalemia   . Insomnia   . Renal insufficiency 11/29/2010  . Septic embolism ([place]) 10/18/2010   endocarditis -- per TEE  . Vertigo   . Wears dentures    partial lower. upper doesn't fit    PAST SURGICAL HISTORY: Past Surgical History:  Procedure Laterality Date  . ABDOMINAL HYSTERECTOMY  1979   full  . bladder tuck  2007?  Marland Kitchen BROW LIFT Bilateral 03/06/2016   Procedure: BLEPHAROPLASTY REPAIR, RESECT EX;  Surgeon: Karle Starch, MD;  Location: [facility];  Service: Ophthalmology;  Laterality: Bilateral;  . CHOLECYSTECTOMY  09/29/1999  . LEFT HEART CATHETERIZATION WITH CORONARY ANGIOGRAM N/A 05/23/2012   Procedure: LEFT HEART CATHETERIZATION WITH CORONARY ANGIOGRAM;  Surgeon: Minus Breeding, MD;  Location: [facility] CATH LAB;  Service: Cardiovascular;  Laterality: N/A;  . TEE WITHOUT CARDIOVERSION  10/10/2010    done with suggestion of small mitral vegetation.  ID was consulted for input and ID feels  patient with possible endocarditis with septic emboli    FAMILY HISTORY: Family History  Problem Relation Age of Onset  . Coronary artery disease Unknown         family history of  . Colon cancer Unknown        family history of  . Cerebral palsy Sister   . Colon cancer Brother   . Prostate cancer Brother     SOCIAL HISTORY: Social History   Socioeconomic History  . Marital status: Married    Spouse name: Frances Mendoza  . Number of children: 3  . Years of education: 68  . Highest education level: Not on file  Occupational History    Comment: patient not employed  . Occupation: Retired  Tobacco Use  . Smoking status: Never Smoker  . Smokeless tobacco: Never Used  Substance and Sexual Activity  . Alcohol use: No  . Drug use: No  . Sexual activity: Not Currently  Other Topics Concern  . Not on file  Social History Narrative   Patient lives at home with spouse. Howard   Caffeine Use: 2 cups daily   Patient has 3 children.    Patient is retired.          Social Determinants of Health   Financial Resource Strain:   . Difficulty of Paying Living Expenses: Not on file  Food Insecurity:   . Worried About Charity fundraiser in the Last Year: Not on file  . Ran Out of Food in the Last Year: Not on file  Transportation Needs:   . Lack of Transportation (Medical): Not on file  . Lack of Transportation (Non-Medical): Not on file  Physical Activity:   . Days of Exercise per Week: Not on file  . Minutes of Exercise per Session: Not on file  Stress:   . Feeling of Stress : Not on file  Social Connections:   . Frequency of Communication with Friends and Family: Not on file  . Frequency of Social Gatherings with Friends and Family: Not on file  . Attends Religious Services: Not on file  . Active Member of Clubs or Organizations: Not on file  . Attends Archivist Meetings: Not on file  . Marital Status: Not on file  Intimate Partner Violence:   . Fear of Current or Ex-Partner: Not on file  . Emotionally Abused: Not on file  . Physically Abused: Not on file  . Sexually Abused: Not on file       PHYSICAL EXAM  Vitals:    02/17/19 0845  BP: (!) 149/81  Pulse: 79  Temp: (!) 97.1 F (36.2 C)  Weight: 145 lb (65.8 kg)  Height:  (1.6 m)   Body mass index is 25.69 kg/m.  Generalized: Well developed, in no acute distress  Head: normocephalic and atraumatic,. Oropharynx benign  Neck: Supple, no carotid bruits  Cardiac: Regular rate rhythm, no murmur  Muscloskeletal Arthritic changes in fingers Neurological examination  MMSE - Mini Mental State Exam 02/18/2019 09/23/2017 03/21/2017  Orientation to time Orientation to Place Registration Attention/ Calculation Recall Language- name 2 objects Language- repeat Language- follow 3 step command Language- read & follow direction Write a sentence Copy design Total score Animal naming 13  Cranial nerve II-XII: Pupils were equal round reactive to light extraocular movements were full, visual field were full on confrontational test. Facial sensation and strength were normal. hearing was intact to finger rubbing bilaterally. Uvula tongue midline. head turning and shoulder shrug were normal and symmetric.Tongue protrusion into cheek strength was normal. Motor: normal bulk and tone, full strength in the BUE, BLE, Sensory: normal and symmetric to light touch,  Coordination: finger-nose-finger, heel-to-shin bilaterally, no dysmetria Reflexes: Brachioradialis 2/2, biceps 2/2, triceps 2/2, patellar 2/2, Achilles 2/2, plantar responses were flexor bilaterally. Gait and Station: Rising up from seated position by pushing on chair arm, steady  DIAGNOSTIC DATA (LABS, IMAGING, TESTING)    ASSESSMENT AND PLAN  83 y.o. year old female   Mild cognitive impairment  Overall stable, Mini-Mental Status Examination is 29/30  Refilled her prescription of Aricept 10 mg daily, Namenda 10 mg twice a day.  Follow-up with her primary care physician  Levert Feinstein, M.D. Ph.D.  [facility] [address] [place], [place] [address] Phone: [phone] Fax:      [phone]

## 2019-05-27 ENCOUNTER — Encounter: Payer: Self-pay | Admitting: Cardiovascular Disease

## 2019-05-27 ENCOUNTER — Other Ambulatory Visit: Payer: Self-pay

## 2019-05-27 ENCOUNTER — Ambulatory Visit (INDEPENDENT_AMBULATORY_CARE_PROVIDER_SITE_OTHER): Payer: Medicare Other | Admitting: Cardiovascular Disease

## 2019-05-27 VITALS — BP 118/56 | HR 82 | Ht 63.0 in | Wt 146.8 lb

## 2019-05-27 DIAGNOSIS — I38 Endocarditis, valve unspecified: Secondary | ICD-10-CM

## 2019-05-27 DIAGNOSIS — I340 Nonrheumatic mitral (valve) insufficiency: Secondary | ICD-10-CM

## 2019-05-27 DIAGNOSIS — I059 Rheumatic mitral valve disease, unspecified: Secondary | ICD-10-CM

## 2019-05-27 MED ORDER — AZITHROMYCIN 500 MG PO TABS: ORAL_TABLET | ORAL | 3 refills | Status: DC

## 2019-05-27 NOTE — Progress Notes (Signed)
Frances Mendoza Date of Birth  Dec 05, 1935 [place] [address]    [place]   [place], [place]  [address]    [place],   [address] [phone]  Fax  [phone]  [phone]  Fax [phone]   Problem list 1. Mitral Valve Endocarditis - 2012 2.  Septic emboli leading to stroke   History of Present Illness:  Frances Mendoza is a 84 yo with a hx of bacterial endocarditis with subsequent septic emboli and CVA.  She was hospitalized in November for CP.  She ruled out for MI.  She complains of lots of pain in the back of both hips radiating down to her legs. She thinks may be related to one of her medications. He typically hurts worse when she stands up and walks around. It does not hurt at night when she is lying in bed.  The pain occurs whenever she stands or walks - not when she is lying down.  She denies any cardiac issues.  May 23, 2012  Frances Mendoza awoke with chest pain and left pain.  The pain lasted for 1-2 hours.  Her son checked her BP and HR which were normal.    The severe pain has resolved but her chest is still not quite normal.    She has had some pains in her chest in the past but this was more severe.  The pain resolved with a valium.  The pains were no pleuretic.  She had dyspnea associated with the pain.  No diaphoresis.  No syncope.  No palpitations.   No fever/ chills/ blood in urine or stool.   She gets some exercise - she has some dyspnea but no angina.    She has occasional episodes of palptations.  She feels like her heart is racing for 5 - 10 minutes.    She takes the valium 2-3 times a day.    She has chronic back pain and has been told that she needs to have surgery.  She does not want to have surgery.    May 07, 2013;  She is doing OK.  She is here alone today.  Has lots of anxiety. She has multiple general complaints.   Jan. 8, 2016:  Frances Mendoza is seen with her daughter today.  She has had  continued shortness of breath . She had an echo at [facility] which is basically unchanged from our echo She feels well When she went to her medical doctor's office, she had not taken her HCTZ and her BP was elevated.  She was told that she should probably take a different BP med.   Today, she is doing great from a cardiac standpoint.    Aug. 29,. 2016:  She is doing well   Oct. 9, 2017  Frances Mendoza is seen today for follow up of her endocarditis   February 25, 2017: Doing well Planning her garden for this coming spring  Watcher her salt intake   May 27, 2019 Getting some exercise  No CP or dyspnea  Takes Abx before dental work .   Current Outpatient Medications on File Prior to Visit  Medication Sig Dispense Refill  . albuterol (PROVENTIL HFA;VENTOLIN HFA) 108 (90 BASE) MCG/ACT inhaler Inhale 2 puffs into the lungs every 6 (six) hours as needed for wheezing or shortness of breath.    . Ascorbic Acid (VITAMIN C PO) Take by mouth daily.    Marland Kitchen  azelastine (ASTELIN) 0.1 % nasal spray Place into both nostrils daily as needed for rhinitis. Use in each nostril as directed    . benzonatate (TESSALON) 200 MG capsule TK 1 C PO TID prn  0  . CINNAMON PO Take by mouth daily.    . Cyanocobalamin (VITAMIN B12 PO) Take by mouth daily.    . diazepam (VALIUM) 5 MG tablet Take 5 mg by mouth every 8 (eight) hours as needed for anxiety. 3 daily    . donepezil (ARICEPT) 10 MG tablet Take 1 tablet (10 mg total) by mouth daily. 90 tablet 4  . fluticasone (FLONASE) 50 MCG/ACT nasal spray Place 1 spray into the nose 2 (two) times daily.      . hydrochlorothiazide (HYDRODIURIL) 25 MG tablet Take 1 tablet (25 mg total) by mouth daily. Please make appt with Dr. Elease Hashimoto for future refills. 2nd attempt 90 tablet 1  . Magnesium 250 MG TABS Take by mouth daily.    . meclizine (ANTIVERT) 25 MG tablet Take 25 mg by mouth 3 (three) times daily as needed for dizziness.    . memantine (NAMENDA) 10 MG tablet Take 1  tablet (10 mg total) by mouth 2 (two) times daily. 180 tablet 4  . Misc Natural Products (TART CHERRY ADVANCED PO) Take by mouth daily.    . nitroGLYCERIN (NITROSTAT) 0.4 MG SL tablet Place 1 tablet (0.4 mg total) under the tongue every 5 (five) minutes x 3 doses as needed for chest pain. 25 tablet 11  . potassium chloride (KLOR-CON 10) 10 MEQ tablet TAKE 2 TABLETS BY MOUTH DAILY 180 tablet 3  . [DISCONTINUED] potassium chloride (K-DUR,KLOR-CON) 10 MEQ tablet TAKE 2 TABLETS BY MOUTH DAILY 60 tablet 0   No current facility-administered medications on file prior to visit.    Allergies  Allergen Reactions  . Amoxicillin Anaphylaxis  . Penicillins Anaphylaxis  . Tolectin [Tolmetin Sodium] Anaphylaxis    Took at same time as amoxicillin when she had anaphylactic reaction.  . Tolmetin Anaphylaxis  . Ciprofloxacin Rash    Entire body looked as if sunburned, then skin peeled off.  . Statins Other (See Comments)    Leg pain  . Ioxaglate Hives    Patient had severe hives after Myelogram.  No issues breathing.  Returned for  a nerve root injection, was given Benadryl, mentioned some minor itching to daughter on the way home but no further issues. Was given a Benadryl today for this injection.   . Other Other (See Comments)  . Prolactin   . Bactrim [Sulfamethoxazole-Trimethoprim] Rash    ? Early rash on palms  . Codeine Nausea And Vomiting  . Contrast Media [Iodinated Diagnostic Agents] Hives    Patient had severe hives after Myelogram.  No issues breathing.  Returned for  a nerve root injection, was given Benadryl, mentioned some minor itching to daughter on the way home but no further issues. Was given a Benadryl today for this injection.   . Imipenem Rash  . Megestrol Rash  . Procardia [Nifedipine] Other (See Comments)    Causes aches down legs  . Vancomycin Rash    Past Medical History:  Diagnosis Date  . Allergy   . Anxiety   . Arthritis    osteoarthritis  . Back problem   . CAD  (coronary artery disease)    a. admx with CP c/w [place] 4/14 => LHC: pLAD 40%, RCA with lum irregs => med Rx  . Cerebrovascular accident, embolic (HCC) 10/18/2010  Bilateral  . Depression   . Diverticulosis   . Endocarditis 10/18/2010   treated with vancomycin, gentamicin, and Cipro  . Hemorrhoids, internal   . HLD (hyperlipidemia)   . Hypertension   . Hypokalemia   . Insomnia   . Renal insufficiency 11/29/2010  . Septic embolism (HCC) 10/18/2010   endocarditis -- per TEE  . Vertigo   . Wears dentures    partial lower. upper doesn't fit    Past Surgical History:  Procedure Laterality Date  . ABDOMINAL HYSTERECTOMY  1979   full  . bladder tuck  2007?  Marland Kitchen BROW LIFT Bilateral 03/06/2016   Procedure: BLEPHAROPLASTY REPAIR, RESECT EX;  Surgeon: Imagene Riches, MD;  [facility];  Service: Ophthalmology;  Laterality: Bilateral;  . CHOLECYSTECTOMY  09/29/1999  . LEFT HEART CATHETERIZATION WITH CORONARY ANGIOGRAM N/A 05/23/2012   Procedure: LEFT HEART CATHETERIZATION WITH CORONARY ANGIOGRAM;  Surgeon: Rollene Rotunda, MD;  Location[facility] C[facility] LAB;  Service: Cardiovascular;  Laterality: N/A;  . TEE WITHOUT CARDIOVERSION  10/10/2010    done with suggestion of small mitral vegetation.  ID was consulted for input and ID feels  patient with possible endocarditis with septic emboli    Social History   Tobacco Use  Smoking Status Never Smoker  Smokeless Tobacco Never Used    Social History   Substance and Sexual Activity  Alcohol Use No    Family History  Problem Relation Age of Onset  . Coronary artery disease Unknown        family history of  . Colon cancer Unknown        family history of  . Cerebral palsy Sister   . Colon cancer Brother   . Prostate cancer Brother     Reviw of Systems:  Reviewed in the HPI.  All other systems are negative.    ECG: May 27, 2019: Normal sinus rhythm at 83.  Old inferior wall myocardial infarction.  Nonspecific ST and T wave  abnormalities.    Assessment / Plan:   1. Endocarditis -   S/p MV endocarditis , there is no evidence of recurrent vegetation.  She seems to be asymptomatic.  Given her age and the fact that she is doing so well, I do not think that we need to do any further investigations or consider replacing the valve.     Will give her Azithromycin 500 mg po be given 1 hour prior to a dental procedure.  She is allergic to penicillin and has severe diarrhea to clindamycin.  2.  Septic emboli leading to stroke  3.   TIA -     4. Essential HTN:      5. Hyperlipidemia :  Managed by her primary md    6.  Question of  MI :    Has  Inferior Q waves .   I suspect that she had an inferior wall MI during her embolic event    Kristeen Miss, MD  05/27/2019 2:21 PM    [facility] [address] [place],  [address] [place], [place]  [address] Pager [phone] Phone: [phone]; Fax: [phone]

## 2019-05-27 NOTE — Patient Instructions (Addendum)
Medication Instructions:  Your physician has recommended you make the following change in your medication:  1-take Azithromycin 500 mg by mouth one hour prior to dental procedure. 2-STOP HCTZ  *If you need a refill on your cardiac medications before your next appointment, please call your pharmacy*  Lab Work: If you have labs (blood work) drawn today and your tests are completely normal, you will receive your results only by: Marland Kitchen MyChart Message (if you have MyChart) OR . A paper copy in the mail If you have any lab test that is abnormal or we need to change your treatment, we will call you to review the results.  Testing/Procedures: None ordered today.  Follow-Up: At [facility], you and your health needs are our priority.  As part of our continuing mission to provide you with exceptional heart care, we have created designated Provider Care Teams.  These Care Teams include your primary Cardiologist (physician) and Advanced Practice Providers (APPs -  Physician Assistants and Nurse Practitioners) who all work together to provide you with the care you need, when you need it.  We recommend signing up for the patient portal called "MyChart".  Sign up information is provided on this After Visit Summary.  MyChart is used to connect with patients for Virtual Visits (Telemedicine).  Patients are able to view lab/test results, encounter notes, upcoming appointments, etc.  Non-urgent messages can be sent to your provider as well.   To learn more about what you can do with MyChart, go to [email or web address].    Your next appointment:   12 month(s)  The format for your next appointment:   Either In Person or Virtual  Provider:   You may see Dr. Elease Hashimoto or one of the following Advanced Practice Providers on your designated Care Team:    Tereso Newcomer, PA-C  [place], [place]

## 2019-05-29 ENCOUNTER — Telehealth: Payer: Self-pay

## 2019-05-29 NOTE — Telephone Encounter (Signed)
I was unable to reach anyone at the office for clarification on this procedure. If similar to cataract surgery, may not need clearance or interruption of ASA therapy.  Please call on Monday.

## 2019-05-29 NOTE — Telephone Encounter (Signed)
   [facility] Pre-operative Risk Assessment    Request for surgical clearance:  1. What type of surgery is being performed? RETINAL DETACHMENT SURGERY   2. When is this surgery scheduled? 06/01/19   3. What type of clearance is required (medical clearance vs. Pharmacy clearance to hold med vs. Both)? BOTH  4. Are there any medications that need to be held prior to surgery and how long? ASA   5. Practice name and name of physician performing surgery? [facility] Ernst Breach, MD   6. What is your office phone number [phone]    7.   What is your office fax number [phone]  8.   Anesthesia type (None, local, MAC, general) ? MAC   Frances Mendoza 05/29/2019, 3:15 PM  _________________________________________________________________   (provider comments below)

## 2019-06-01 NOTE — Telephone Encounter (Signed)
Follow up     Heather from [facility] is calling back    Please call

## 2019-06-01 NOTE — Telephone Encounter (Signed)
   Primary Cardiologist: Kristeen Miss, MD  Chart reviewed as part of pre-operative protocol coverage. Given past medical history and time since last visit, based on ACC/AHA guidelines, Frances Mendoza would be at acceptable risk for the planned procedure without further cardiovascular testing.   Per Dr. Elease Hashimoto, she can hold aspirin 5-7 days prior to her procedure and she restart when cleared to do so by Dr. Lita Mains.  I will route this recommendation to the requesting party via Epic fax function and remove from pre-op pool.  Please call with questions.  Beatriz Stallion, PA-C 06/01/2019, 1:41 PM

## 2019-06-01 NOTE — Telephone Encounter (Signed)
Pt may hold ASA for 5-7 days prior to eye surgery     Kristeen Miss, MD  06/01/2019 1:35 PM    [facility] [address] [place],  [address] [place], [place]  [address] Phone: [phone]; Fax: [phone]

## 2019-06-02 NOTE — Telephone Encounter (Signed)
I have re-faxed the clearance notes today. I s/w Tiffany at Dr. Lita Mains office and assured her that I have re-faxed the clearance notes to them. Looks like clearance notes were faxed yesterday as well also.

## 2019-06-02 NOTE — Telephone Encounter (Signed)
New message   [facility] states that they have not received the fax for the clearance.Please fax to the fax # on file.

## 2019-09-09 ENCOUNTER — Telehealth: Payer: Self-pay | Admitting: *Deleted

## 2019-09-09 NOTE — Telephone Encounter (Signed)
   [facility] Pre-operative Risk Assessment    HEARTCARE STAFF: - Please ensure there is not already an duplicate clearance open for this procedure. - Under Visit Info/Reason for Call, type in Other and utilize the format Clearance MM/DD/YY or Clearance TBD. Do not use dashes or single digits. - If request is for dental extraction, please clarify the # of teeth to be extracted.  Request for surgical clearance:  1. What type of surgery is being performed? 9 TEETH TO BE SURGICALLY EXTRACTED   2. When is this surgery scheduled? TBD   3. What type of clearance is required (medical clearance vs. Pharmacy clearance to hold med vs. Both)? MEDICAL  4. Are there any medications that need to be held prior to surgery and how long? ASA ; SURGEON IS ASKING IF PT IS NEEDING SBE  5. Practice name and name of physician performing surgery? [facility]; DR. Janean Sark   6. What is the office phone number? [phone]   7.   What is the office fax number? [phone]  8.   Anesthesia type (None, local, MAC, general) ? LOCAL   Julaine Hua 09/09/2019, 2:45 PM  _________________________________________________________________   (provider comments below)

## 2019-09-09 NOTE — Telephone Encounter (Signed)
Frances Mendoza is a 84 yo would like to have 9 teeth extracted.  She was last seen in the clinic on 05/27/2019.  She was doing well at that time.  May her aspirin be held for the procedure?  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  She was hospitalized in November for CP.  She ruled out for MI.  She complained of lots of pain in the back of both hips radiating down to her legs. She thought it may be related to one of her medications. He typically hurt worse when she stood up and walked around. It did not hurt at night when she is lying in bed.  The pain occured whenever she stood or walked - not when she is lying down.  May 27, 2019 Getting some exercise  No CP or dyspnea  Takes Abx before dental work .   Please respond to CV DIV preop pool.  Thank you for your help.  [name]. Cyra Spader NP-C    09/09/2019, 3:29 PM [facility] [address] Office [phone] Fax [phone]

## 2019-09-10 NOTE — Telephone Encounter (Signed)
Frances Mendoza may hold her ASA for 5-7 days prior to her extensive dental extraction She is at low risk for procedure

## 2019-09-10 NOTE — Telephone Encounter (Signed)
   Primary Cardiologist: Kristeen Miss, MD  Chart reviewed and patient contacted today by phone as part of pre-operative protocol coverage. Given past medical history and time since last visit, based on ACC/AHA guidelines, LAUNA GOEDKEN would be at acceptable risk for the planned procedure without further cardiovascular testing.   Ok to hold aspirin 5-7 days pre dental extraction, resume as soon as possible post extraction. This patient will require SBE prophylaxis prior to her procedure- Azithromycin 500 mg po one hour pre op. The patient understands these instructions and says she has an Rx for Azithromycin.  I will route this recommendation to the requesting party via Epic fax function and remove from pre-op pool.  Please call with questions.  Corine Shelter, PA-C 09/10/2019, 8:45 AM

## 2019-11-09 ENCOUNTER — Other Ambulatory Visit: Payer: Self-pay

## 2020-03-09 ENCOUNTER — Other Ambulatory Visit: Payer: Self-pay | Admitting: Neurology

## 2020-03-16 ENCOUNTER — Other Ambulatory Visit: Payer: Self-pay | Admitting: Neurology

## 2020-03-19 ENCOUNTER — Other Ambulatory Visit: Payer: Self-pay | Admitting: Neurology

## 2020-05-10 ENCOUNTER — Telehealth: Payer: Self-pay | Admitting: Neurology

## 2020-05-10 NOTE — Telephone Encounter (Signed)
Pt.'s daughter called & scheduled an appt. for mom. Mom was talking on the phone as well & she states she just wanted to have a follow up with doctor.

## 2020-05-10 NOTE — Telephone Encounter (Addendum)
I spoke to the patient. Feels her memory is a little worse - good and bad days. She would like to see neurology for this concern. Pending appt showing on 06/28/20.

## 2020-06-07 ENCOUNTER — Encounter: Payer: Self-pay | Admitting: Cardiovascular Disease

## 2020-06-07 NOTE — Progress Notes (Signed)
[name] July Date of Birth  1935/03/22 [facility]     [place] Office  [address]    [address]   [address] [place], [place]  [address]    [place], [place]  [address] [phone]  Fax  [phone]  [phone]  Fax [phone]   Problem list 1. Mitral Valve Endocarditis - 2012 2.  Septic emboli leading to stroke   History of Present Illness:  Ms. Finau is a 85 yo with a hx of bacterial endocarditis with subsequent septic emboli and CVA.  She was hospitalized in November for CP.  She ruled out for MI.  She complains of lots of pain in the back of both hips radiating down to her legs. She thinks may be related to one of her medications. He typically hurts worse when she stands up and walks around. It does not hurt at night when she is lying in bed.  The pain occurs whenever she stands or walks - not when she is lying down.  She denies any cardiac issues.  May 23, 2012  Ms. Asaro awoke with chest pain and left pain.  The pain lasted for 1-2 hours.  Her son checked her BP and HR which were normal.    The severe pain has resolved but her chest is still not quite normal.    She has had some pains in her chest in the past but this was more severe.  The pain resolved with a valium.  The pains were no pleuretic.  She had dyspnea associated with the pain.  No diaphoresis.  No syncope.  No palpitations.   No fever/ chills/ blood in urine or stool.   She gets some exercise - she has some dyspnea but no angina.    She has occasional episodes of palptations.  She feels like her heart is racing for 5 - 10 minutes.    She takes the valium 2-3 times a day.    She has chronic back pain and has been told that she needs to have surgery.  She does not want to have surgery.    May 07, 2013;  She is doing OK.  She is here alone today.  Has lots of anxiety. She has multiple general complaints.   Jan. 8, 2016:  Atarah is seen with her daughter today.  She has had  continued shortness of breath . She had an echo at [facility] which is basically unchanged from our echo She feels well When she went to her medical doctor's office, she had not taken her HCTZ and her BP was elevated.  She was told that she should probably take a different BP med.   Today, she is doing great from a cardiac standpoint.    Aug. 29,. 2016:  She is doing well   Oct. 9, 2017  Khristian is seen today for follow up of her endocarditis   February 25, 2017: Doing well Planning her garden for this coming spring  Watcher her salt intake   May 27, 2019 Getting some exercise  No CP or dyspnea  Takes Abx before dental work .    June 08, 2020:  Lilleigh is seen for follow up of her MV endocarditis.   Seen with daughter, [name]   She had a CVA which was thought to be a septic emboli from the endocarditis She also likely  had embolization down her LAD and RCA ( ? Or LCX)  She had an  MI at the same time.   Had a heart cath years ago which did not show any significant CAD .   She has normal left ventricular systolic function by echo in October, 2018. She has moderate mitral stenosis.  There is no changes from previous echocardiograms. Has had more shortness of breath over the past week Has been weeding her garden .  Put out pine straw [name] thinks she is able to do lot of work for someone age 27.    Current Outpatient Medications on File Prior to Visit  Medication Sig Dispense Refill  . acetaminophen (TYLENOL) 500 MG tablet Take 500 mg by mouth every 6 (six) hours as needed.    Marland Kitchen albuterol (PROVENTIL HFA;VENTOLIN HFA) 108 (90 BASE) MCG/ACT inhaler Inhale 2 puffs into the lungs every 6 (six) hours as needed for wheezing or shortness of breath.    . Ascorbic Acid (VITAMIN C PO) Take 50 mg by mouth daily.     Marland Kitchen aspirin 81 MG EC tablet Take 81 mg by mouth daily.    Marland Kitchen azelastine (ASTELIN) 0.1 % nasal spray Place into both nostrils daily as needed for rhinitis. Use in each  nostril as directed    . benzonatate (TESSALON) 200 MG capsule Take 200 mg by mouth 3 (three) times daily as needed.  0  . Cyanocobalamin (VITAMIN B12 PO) Take 1,000 mg by mouth daily.     . diazepam (VALIUM) 5 MG tablet Take 5 mg by mouth every 8 (eight) hours as needed for anxiety. 3 daily    . donepezil (ARICEPT) 10 MG tablet Take 1 tablet (10 mg total) by mouth daily. 90 tablet 4  . fluticasone (FLONASE) 50 MCG/ACT nasal spray Place 1 spray into the nose 2 (two) times daily.    . Magnesium 250 MG TABS Take 400 mg by mouth daily.     . meclizine (ANTIVERT) 25 MG tablet Take 25 mg by mouth 3 (three) times daily as needed for dizziness.    . memantine (NAMENDA) 10 MG tablet Take 1 tablet (10 mg total) by mouth 2 (two) times daily. 180 tablet 4  . Misc Natural Products (TART CHERRY ADVANCED PO) Take by mouth daily.    . nitroGLYCERIN (NITROSTAT) 0.4 MG SL tablet Place 1 tablet (0.4 mg total) under the tongue every 5 (five) minutes x 3 doses as needed for chest pain. 25 tablet 11  . sertraline (ZOLOFT) 100 MG tablet Take 1 tablet by mouth daily.    . [DISCONTINUED] potassium chloride (K-DUR,KLOR-CON) 10 MEQ tablet TAKE 2 TABLETS BY MOUTH DAILY 60 tablet 0   No current facility-administered medications on file prior to visit.    Allergies  Allergen Reactions  . Amoxicillin Anaphylaxis  . Penicillins Anaphylaxis  . Tolectin [Tolmetin Sodium] Anaphylaxis    Took at same time as amoxicillin when she had anaphylactic reaction.  . Tolmetin Anaphylaxis  . Ciprofloxacin Rash    Entire body looked as if sunburned, then skin peeled off.  . Statins Other (See Comments)    Leg pain  . Ioxaglate Hives    Patient had severe hives after Myelogram.  No issues breathing.  Returned for  a nerve root injection, was given Benadryl, mentioned some minor itching to daughter on the way home but no further issues. Was given a Benadryl today for this injection.   . Other Other (See Comments)  . Prolactin   .  Bactrim [Sulfamethoxazole-Trimethoprim] Rash    ? Early rash on palms  . Codeine  Nausea And Vomiting  . Contrast Media [Iodinated Diagnostic Agents] Hives    Patient had severe hives after Myelogram.  No issues breathing.  Returned for  a nerve root injection, was given Benadryl, mentioned some minor itching to daughter on the way home but no further issues. Was given a Benadryl today for this injection.   . Imipenem Rash  . Megestrol Rash  . Procardia [Nifedipine] Other (See Comments)    Causes aches down legs  . Vancomycin Rash    Past Medical History:  Diagnosis Date  . Allergy   . Anxiety   . Arthritis    osteoarthritis  . Back problem   . CAD (coronary artery disease)    a. admx with CP c/w [place] 4/14 => LHC: pLAD 40%, RCA with lum irregs => med Rx  . 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 Depression   . Diverticulosis   . Endocarditis 10/18/2010   treated with vancomycin, gentamicin, and Cipro  . Hemorrhoids, internal   . HLD (hyperlipidemia)   . Hypertension   . Hypokalemia   . Insomnia   . Renal insufficiency 11/29/2010  . Septic embolism (HCC) 10/18/2010   endocarditis -- per TEE  . Vertigo   . Wears dentures    partial lower. upper doesn't fit    Past Surgical History:  Procedure Laterality Date  . ABDOMINAL HYSTERECTOMY  1979   full  . bladder tuck  2007?  Marland Kitchen BROW LIFT Bilateral 03/06/2016   Procedure: BLEPHAROPLASTY REPAIR, RESECT EX;  Surgeon: Imagene Riches, MD;  Location: [facility];  Service: Ophthalmology;  Laterality: Bilateral;  . CHOLECYSTECTOMY  09/29/1999  . LEFT HEART CATHETERIZATION WITH CORONARY ANGIOGRAM N/A 05/23/2012   Procedure: LEFT HEART CATHETERIZATION WITH CORONARY ANGIOGRAM;  Surgeon: Rollene Rotunda, MD;  Location: [facility];  Service: Cardiovascular;  Laterality: N/A;  . TEE WITHOUT CARDIOVERSION  10/10/2010    done with suggestion of small mitral vegetation.  ID was consulted for input and ID feels   patient with possible endocarditis with septic emboli    Social History   Tobacco Use  Smoking Status Never Smoker  Smokeless Tobacco Never Used    Social History   Substance and Sexual Activity  Alcohol Use No    Family History  Problem Relation Age of Onset  . Coronary artery disease Unknown        family history of  . Colon cancer Unknown        family history of  . Cerebral palsy Sister   . Colon cancer Brother   . Prostate cancer Brother     Reviw of Systems:  Reviewed in the HPI.  All other systems are negative.  Physical Exam: Blood pressure 138/72, pulse 76, height 5\' 2"  (1.575 m), weight 142 lb 6.4 oz (64.6 kg), SpO2 97 %.  GEN:  Well nourished, well developed in no acute distress HEENT: Normal NECK: No JVD; No carotid bruits LYMPHATICS: No lymphadenopathy CARDIAC: RRR  Soft systolic murmur  RESPIRATORY:  Clear to auscultation without rales, wheezing or rhonchi  ABDOMEN: Soft, non-tender, non-distended MUSCULOSKELETAL:  No edema; No deformity  SKIN: Warm and dry NEUROLOGIC:  Alert and oriented x 3   ECG: June 08, 2020: Normal sinus rhythm at 76.  Q waves in lead V3 and V4 consistent with a previous anterior wall myocardial infarction.  Q waves in leads III and aVF consistent with a previous inferior wall myocardial infarction.   Assessment / Plan:  1. Endocarditis -   S/p MV endocarditis , there is no evidence of recurrent vegetation.  She does have increasing shortness of breath.  We will repeat her echocardiogram.  She has a active prescription for azithromycin 500 mg to be given 1 hour prior to dental procedures.  2.  Septic emboli leading to stroke  3.   TIA -     4. Essential HTN:    Blood pressures well controlled.  5. Hyperlipidemia :   Lipids are being managed by her primary medical doctor.  She had labs checked several months ago.  Her levels were up that she has stopped all of her cholesterol medications.  I will leave further  management up to her primary medical doctor.  6.  Question of  MI :    Has  Inferior Q waves and anterior Q waves.  I suspect she had an MI during her embolic event. Repeat echo to check valve function and LV function    Kristeen MissPhilip Faustino Luecke, MD  06/08/2020 1:44 PM    [facility] [address],  [address] [place], [place]  [address] Pager [phone] Phone: [phone]; Fax: [phone]

## 2020-06-08 ENCOUNTER — Ambulatory Visit (INDEPENDENT_AMBULATORY_CARE_PROVIDER_SITE_OTHER): Payer: Medicare Other | Admitting: Cardiovascular Disease

## 2020-06-08 ENCOUNTER — Encounter: Payer: Self-pay | Admitting: Cardiovascular Disease

## 2020-06-08 ENCOUNTER — Other Ambulatory Visit: Payer: Self-pay

## 2020-06-08 VITALS — BP 138/72 | HR 76 | Ht 62.0 in | Wt 142.4 lb

## 2020-06-08 DIAGNOSIS — I059 Rheumatic mitral valve disease, unspecified: Secondary | ICD-10-CM

## 2020-06-08 MED ORDER — AZITHROMYCIN 500 MG PO TABS: ORAL_TABLET | ORAL | 3 refills | Status: DC

## 2020-06-08 NOTE — Patient Instructions (Signed)
Medication Instructions:  Your physician recommends that you continue on your current medications as directed. Please refer to the Current Medication list given to you today.  *If you need a refill on your cardiac medications before your next appointment, please call your pharmacy*   Lab Work: none If you have labs (blood work) drawn today and your tests are completely normal, you will receive your results only by: Marland Kitchen MyChart Message (if you have MyChart) OR . A paper copy in the mail If you have any lab test that is abnormal or we need to change your treatment, we will call you to review the results.   Testing/Procedures: Your physician has requested that you have an echocardiogram. Echocardiography is a painless test that uses sound waves to create images of your heart. It provides your doctor with information about the size and shape of your heart and how well your heart's chambers and valves are working. This procedure takes approximately one hour. There are no restrictions for this procedure.   Follow-Up: At [facility], you and your health needs are our priority.  As part of our continuing mission to provide you with exceptional heart care, we have created designated Provider Care Teams.  These Care Teams include your primary Cardiologist (physician) and Advanced Practice Providers (APPs -  Physician Assistants and Nurse Practitioners) who all work together to provide you with the care you need, when you need it.  We recommend signing up for the patient portal called "MyChart".  Sign up information is provided on this After Visit Summary.  MyChart is used to connect with patients for Virtual Visits (Telemedicine).  Patients are able to view lab/test results, encounter notes, upcoming appointments, etc.  Non-urgent messages can be sent to your provider as well.   To learn more about what you can do with MyChart, go to [email or web address].    Your next appointment:   1  year(s)  The format for your next appointment:   In Person  Provider:   You may see Kristeen Miss, MD or one of the following Advanced Practice Providers on your designated Care Team:    Tereso Newcomer, PA-C  [place], [place]

## 2020-06-28 ENCOUNTER — Ambulatory Visit (HOSPITAL_COMMUNITY): Payer: Medicare Other | Attending: Cardiology

## 2020-06-28 ENCOUNTER — Encounter: Payer: Self-pay | Admitting: Neurology

## 2020-06-28 ENCOUNTER — Other Ambulatory Visit: Payer: Self-pay

## 2020-06-28 ENCOUNTER — Ambulatory Visit (INDEPENDENT_AMBULATORY_CARE_PROVIDER_SITE_OTHER): Payer: Medicare Other | Admitting: Neurology

## 2020-06-28 VITALS — Ht 62.0 in | Wt 140.0 lb

## 2020-06-28 DIAGNOSIS — R4189 Other symptoms and signs involving cognitive functions and awareness: Secondary | ICD-10-CM | POA: Insufficient documentation

## 2020-06-28 DIAGNOSIS — Z8673 Personal history of transient ischemic attack (TIA), and cerebral infarction without residual deficits: Secondary | ICD-10-CM | POA: Diagnosis not present

## 2020-06-28 DIAGNOSIS — I059 Rheumatic mitral valve disease, unspecified: Secondary | ICD-10-CM | POA: Insufficient documentation

## 2020-06-28 LAB — ECHOCARDIOGRAM COMPLETE
Area-P 1/2: 2.2 cm2
Height: 62 in
MV VTI: 1.48 cm2
P 1/2 time: 336 msec
S' Lateral: 2.6 cm
Weight: 2240 oz

## 2020-06-28 MED ORDER — MEMANTINE HCL 10 MG PO TABS: 10.0000 mg | ORAL_TABLET | Freq: Two times a day (BID) | ORAL | 4 refills | Status: DC

## 2020-06-28 MED ORDER — DONEPEZIL HCL 10 MG PO TABS: 10.0000 mg | ORAL_TABLET | Freq: Every day | ORAL | 4 refills | Status: DC

## 2020-06-28 NOTE — Progress Notes (Signed)
[facility]  ASSESSMENT AND PLAN  85 y.o. year old female  Mild cognitive impairment  Overall stable, Mini-Mental Status Examination is 29/30  Refilled her prescription of Aricept 10 mg daily, Namenda 10 mg twice a day.  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  No acute infarct or intracranial hemorrhage.  Remote left temporal lobe infarct. Remote bilateral centrum semiovale/corona radiata small infarcts. Remote small left thalamic infarct.  Progressive prominent white matter changes most consistent with result of chronic microvascular changes.  Transverse ligament hypertrophy. Cervical spondylotic changes with spinal stenosis and various degrees of cord flattening C3-4 thru C5-6.  Maxillary sinus mucosal thickening greater on left with there is a small air-fluid level raising possibility of acute sinusitis.  HISTORY OF PRESENT ILLNESS: Frances Mendoza is a 85 years old right-handed female, accompanied by her daughter [name] for evaluation of short-term memory trouble, last visit was September 2015   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, presented with confusion, delirium, gait difficulty, TEE showed ejection fraction 65%, calcified mitral valve, no vegetation, was diagnosed with a septic endocarditis, was treated with prolonged antibiotics, antiplatelet agent, also with non-ST elevation MI, she also had  past medical history of hypertension, anxiety, hyperlipidemia, she had a high school graduate, lives at home with her husband,  Ever since that event she has become much less active, only driving occasionally at short distance, also has short-term memory trouble, occasionally confusion, no longer cooking, she also complains of chronic low back pain, long-standing history of depression anxiety, she is taking Valium 5 mg 3 times a day, her husband has taken over her prescription medication management, but she tends to take a lot of supplements, as needed Tylenol, ibuprofen,  In May 20 fourth 2014, she woke up was noticed by her family to have confusion, unsteady gait, lasting for 24 hours, no seizure activity was noted, she was admitted to the hospital, May 24th through 27th, 2014.  MRI head again showed acute infarction: Two or three punctate acute infarctions in the left frontal region affecting the caudate head and left frontal lobe.  No swelling, hemorrhage or mass effect.  Moderate chronic small vessel changes elsewhere throughout the brain.    MRA HEAD: No major vessel occlusion or correctable proximal stenosis. Repeat echocardiogram showed ejection fraction 65%, calcified mitral valve, no vegetations, EEG was normal, she is not back to her normal self, very argumentary, especially to her daughter. Ultrasound of carotid artery showed no large vessel disease  Mother died age 50 from stroke, father died age 1 from heart attack, no family history of dementia  UPDATE Sept 28th 2015: Her husband is helping with her medications, she is alone at visit, she is overall doing well, thinking she is improving. She works in her garden a lot, still active at home, cooking Sunday lunch for her extended family.  She has no trouble walking, she fell sometimes when turning quickly  UPDATE Mar 21 2015: She is with her daughter at visit,  she had bad cold in Dec 2016, take few weeks to recover, she  complains of gradual worsening memory loss, but she still very active, she still drives, no gait difficulties, she has chronic insomnia, taking Valium 5 mg half to one tablets every night as needed  Update February 17, 2019: She has been followed up by couple years, overall stable, still lives alone, driving, taking care of her household without much difficulty, today's Mini-Mental Status Examination 29 out of 30, she is taking Namenda 10 mg twice a day, and Aricept 10 mg daily  UPDATE Jun 28 2020: She live by herself, still drive short distance, her husband passed away in 11/01/2019, she still graves about it, she has no appetite, just had all teeth pulled out,   She is with her son Iantha Fallen at today's visit, stated that she has worsening memory loss, mild depression,but still busy all day long, she still cook few times for everybody after Sunday washing  She started to sleep better, taking valium prn, she had been using variable dose of Valium for more than few decades, She is also taking zoloft 100mg  qam since her stroke in 2012    PHYSICAL EXAM  Vitals:   06/28/20 1122  Weight: 140 lb (63.5 kg)  Height: 5\' 2"  (1.575 m)   Body mass index is 25.61 kg/m.  Generalized: Well developed, in no acute distress  Head: normocephalic and atraumatic,. Oropharynx benign  Neck: Supple, no carotid bruits  Cardiac: Regular rate rhythm, no murmur  Muscloskeletal Arthritic changes in fingers Neurological examination  MMSE - Mini Mental State Exam 02/18/2019 09/23/2017 03/21/2017  Orientation to time 5 4 5   Orientation to Place 5 5 5   Registration 3 3 3   Attention/ Calculation 5 5 4   Recall 2 1 1   Language- name 2 objects 2 2 2   Language- repeat 1 1 1   Language- follow 3 step command 3 3 2   Language- read & follow direction 1 1 1   Write a sentence 1 1 1   Copy design 1 1 1   Total score 29 27 26    Animal naming 13   Montreal Cognitive Assessment  06/28/2020  Visuospatial/ Executive (0/5) 4   Naming (0/3) 2  Attention: Read list of digits (0/2) 2  Attention: Read list of letters (0/1) 1  Attention: Serial 7 subtraction starting at 100 (0/3) 3  Language: Repeat phrase (0/2) 2  Language : Fluency (0/1) 0  Abstraction (0/2) 2  Delayed Recall (0/5) 0  Orientation (0/6) 6  Total 22  Adjusted Score (based on education) 22    Cranial nerve II-XII: Pupils were equal round reactive to light extraocular movements were full, visual field were full on confrontational test. Facial sensation and strength were normal. hearing was intact to finger rubbing bilaterally. Uvula tongue midline. head turning and shoulder shrug were normal and symmetric.Tongue protrusion into cheek strength was normal. Motor: Normal strength, Sensory: normal and symmetric to light touch,  Coordination: finger-nose-finger, heel-to-shin bilaterally, no dysmetria Reflexes: Hypoactive, plantar responses were flexor bilaterally. Gait and Station: Rising up from seated position without pushing on chair arm, steady   REVIEW OF SYSTEMS: Full 14 system review of systems performed and notable only for those listed, all others are neg:  As above  ALLERGIES: Allergies  Allergen Reactions  . Amoxicillin Anaphylaxis  . Penicillins Anaphylaxis  . Tolectin [Tolmetin Sodium] Anaphylaxis    Took at same time as amoxicillin when she had anaphylactic reaction.  . Tolmetin Anaphylaxis  . Ciprofloxacin  Rash    Entire body looked as if sunburned, then skin peeled off.  . Statins Other (See Comments)    Leg pain  . Ioxaglate Hives    Patient had severe hives after Myelogram.  No issues breathing.  Returned for  a nerve root injection, was given Benadryl, mentioned some minor itching to daughter on the way home but no further issues. Was given a Benadryl today for this injection.   . Other Other (See Comments)  . Prolactin   . Bactrim [Sulfamethoxazole-Trimethoprim] Rash    ? Early rash on palms  . Codeine Nausea And  Vomiting  . Contrast Media [Iodinated Diagnostic Agents] Hives    Patient had severe hives after Myelogram.  No issues breathing.  Returned for  a nerve root injection, was given Benadryl, mentioned some minor itching to daughter on the way home but no further issues. Was given a Benadryl today for this injection.   . Imipenem Rash  . Megestrol Rash  . Procardia [Nifedipine] Other (See Comments)    Causes aches down legs  . Vancomycin Rash    HOME MEDICATIONS: Outpatient Medications Prior to Visit  Medication Sig Dispense Refill  . acetaminophen (TYLENOL) 500 MG tablet Take 500 mg by mouth every 6 (six) hours as needed.    Marland Kitchen albuterol (PROVENTIL HFA;VENTOLIN HFA) 108 (90 BASE) MCG/ACT inhaler Inhale 2 puffs into the lungs every 6 (six) hours as needed for wheezing or shortness of breath.    . Ascorbic Acid (VITAMIN C PO) Take 50 mg by mouth daily.     Marland Kitchen aspirin 81 MG EC tablet Take 81 mg by mouth daily.    Marland Kitchen azelastine (ASTELIN) 0.1 % nasal spray Place into both nostrils daily as needed for rhinitis. Use in each nostril as directed    . azithromycin (ZITHROMAX) 500 MG tablet Take one tablet by mouth one hour prior to dental procedures. 10 tablet 3  . benzonatate (TESSALON) 200 MG capsule Take 200 mg by mouth 3 (three) times daily as needed.  0  . Cyanocobalamin (VITAMIN B12 PO) Take 1,000 mg by mouth daily.     . diazepam (VALIUM) 5 MG tablet Take 5 mg by mouth every 8 (eight) hours as needed for anxiety. 3 daily    . donepezil (ARICEPT) 10 MG tablet Take 1 tablet (10 mg total) by mouth daily. 90 tablet 4  . fluticasone (FLONASE) 50 MCG/ACT nasal spray Place 1 spray into the nose 2 (two) times daily.    . Magnesium 250 MG TABS Take 400 mg by mouth daily.     . meclizine (ANTIVERT) 25 MG tablet Take 25 mg by mouth 3 (three) times daily as needed for dizziness.    . memantine (NAMENDA) 10 MG tablet Take 1 tablet (10 mg total) by mouth 2 (two) times daily. 180 tablet 4  . Misc Natural  Products (TART CHERRY ADVANCED PO) Take by mouth daily.    . nitroGLYCERIN (NITROSTAT) 0.4 MG SL tablet Place 1 tablet (0.4 mg total) under the tongue every 5 (five) minutes x 3 doses as needed for chest pain. 25 tablet 11  . sertraline (ZOLOFT) 100 MG tablet Take 1 tablet by mouth daily.     No facility-administered medications prior to visit.    PAST MEDICAL HISTORY: Past Medical History:  Diagnosis Date  . Allergy   . Anxiety   . Arthritis    osteoarthritis  . Back problem   . CAD (coronary artery disease)    a.  [place] 4[place]14 => LHC: pLAD 40%, RCA with lum irregs => med Rx  . 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 Depression   . Diverticulosis   . Endocarditis 10/18/2010   treated with vancomycin, gentamicin, and Cipro  . Hemorrhoids, internal   . HLD (hyperlipidemia)   . Hypertension   . Hypokalemia   . Insomnia   . Renal insufficiency 11/29/2010  . Septic embolism (HCC) 10/18/2010   endocarditis -- per TEE  . Vertigo   . Wears dentures    partial lower. upper doesn't fit    PAST SURGICAL HISTORY: Past Surgical History:  Procedure Laterality Date  . ABDOMINAL HYSTERECTOMY  1979   full  . bladder tuck  2007?  Marland Kitchen. BROW LIFT Bilateral 03/06/2016   Procedure: BLEPHAROPLASTY REPAIR, RESECT EX;  Surgeon: Imagene RichesAmy M Fowler, MD;  [facility];  Service: Ophthalmology;  Laterality: Bilateral;  . CHOLECYSTECTOMY  09/29/1999  . LEFT HEART CATHETERIZATION WITH CORONARY ANGIOGRAM N/A 05/23/2012   Procedure: LEFT HEART CATHETERIZATION WITH CORONARY ANGIOGRAM;  Surgeon: Rollene RotundaJames Hochrein, MD;  [facility];  Service: Cardiovascular;  Laterality: N/A;  . TEE WITHOUT CARDIOVERSION  10/10/2010    done with suggestion of small mitral vegetation.  ID was consulted for input and ID feels  patient with possible endocarditis with septic emboli    FAMILY HISTORY: Family History  Problem Relation Age of Onset  . Coronary artery  disease Unknown        family history of  . Colon cancer Unknown        family history of  . Cerebral palsy Sister   . Colon cancer Brother   . Prostate cancer Brother     SOCIAL HISTORY: Social History   Socioeconomic History  . Marital status: Married    Spouse name: Dimas AguasHoward  . Number of children: 3  . Years of education: 4812  . Highest education level: Not on file  Occupational History    Comment: patient not employed  . Occupation: Retired  Tobacco Use  . Smoking status: Never Smoker  . Smokeless tobacco: Never Used  Substance and Sexual Activity  . Alcohol use: No  . Drug use: No  . Sexual activity: Not Currently  Other Topics Concern  . Not on file  Social History Narrative   Patient lives at home with spouse. Howard   Caffeine Use: 2 cups daily   Patient has 3 children.    Patient is retired.          Social Determinants of Health   Financial Resource Strain: Not on file  Food Insecurity: Not on file  Transportation Needs: Not on file  Physical Activity: Not on file  Stress: Not on file  Social Connections: Not on file  Intimate Partner Violence: Not on file      Levert FeinsteinYijun Sloan Takagi, M.D. Ph.D.  [facility] [address] [place], [place] [address] Phone: [phone] Fax:      [phone]

## 2020-06-30 ENCOUNTER — Telehealth: Payer: Self-pay

## 2020-06-30 MED ORDER — METOPROLOL TARTRATE 25 MG PO TABS: 25.0000 mg | ORAL_TABLET | Freq: Two times a day (BID) | ORAL | 3 refills | Status: DC

## 2020-06-30 MED ORDER — AZITHROMYCIN 500 MG PO TABS: ORAL_TABLET | ORAL | 3 refills | Status: DC

## 2020-06-30 NOTE — Telephone Encounter (Signed)
RN spoke to patient regarding results. Medication orders entered. Follow-up appointment scheduled for 8/19.

## 2020-06-30 NOTE — Telephone Encounter (Signed)
-----   Message from Vesta Mixer, MD sent at 06/29/2020  6:30 AM EDT ----- Hyperdynamic LV function Dynamic LVOT obstruction with chordal SAM Mod - severe mitral stenosis  Will start Metoprolol 25 po BID . This should help with her dynamic LVOT obstruction Follow up in 3-4 months

## 2020-07-12 ENCOUNTER — Other Ambulatory Visit (HOSPITAL_COMMUNITY): Payer: Medicare Other

## 2020-10-06 ENCOUNTER — Encounter: Payer: Self-pay | Admitting: Cardiovascular Disease

## 2020-10-06 NOTE — Progress Notes (Signed)
[name] July Date of Birth  1935-04-23 [facility]     [place] [facility]  [address]    [address]   [address] [place], [place]  [address]    [place], [place]  [address] [phone]  Fax  [phone]  [phone]  Fax [phone]   Problem list 1. Mitral Valve Endocarditis - 2012 2.  Septic emboli leading to stroke   Previous notes:   Frances Mendoza is a 85 yo with a hx of bacterial endocarditis with subsequent septic emboli and CVA.  She was hospitalized in November for CP.  She ruled out for MI.  She complains of lots of pain in the back of both hips radiating down to her legs. She thinks may be related to one of her medications. He typically hurts worse when she stands up and walks around. It does not hurt at night when she is lying in bed.  The pain occurs whenever she stands or walks - not when she is lying down.  She denies any cardiac issues.  May 23, 2012  Frances Mendoza awoke with chest pain and left pain.  The pain lasted for 1-2 hours.  Her son checked her BP and HR which were normal.    The severe pain has resolved but her chest is still not quite normal.    She has had some pains in her chest in the past but this was more severe.  The pain resolved with a valium.  The pains were no pleuretic.  She had dyspnea associated with the pain.  No diaphoresis.  No syncope.  No palpitations.   No fever/ chills/ blood in urine or stool.   She gets some exercise - she has some dyspnea but no angina.    She has occasional episodes of palptations.  She feels like her heart is racing for 5 - 10 minutes.    She takes the valium 2-3 times a day.    She has chronic back pain and has been told that she needs to have surgery.  She does not want to have surgery.    May 07, 2013;  She is doing OK.  She is here alone today.  Has lots of anxiety. She has multiple general complaints.   Jan. 8, 2016:  Frances Mendoza is seen with her daughter today.  She has had continued  shortness of breath . She had an echo at [facility] which is basically unchanged from our echo She feels well When she went to her medical doctor's office, she had not taken her HCTZ and her BP was elevated.  She was told that she should probably take a different BP med.   Today, she is doing great from a cardiac standpoint.    Aug. 29,. 2016:  She is doing well   Oct. 9, 2017  Frances Mendoza is seen today for follow up of her endocarditis   February 25, 2017: Doing well Planning her garden for this coming spring  Watcher her salt intake   May 27, 2019 Getting some exercise  No CP or dyspnea  Takes Abx before dental work .    June 08, 2020:  Frances Mendoza is seen for follow up of her MV endocarditis.   Seen with daughter, [name]   She had a CVA which was thought to be a septic emboli from the endocarditis She also likely  had embolization down her LAD and RCA ( ? Or LCX)  She had an MI  at the same time.   Had a heart cath years ago which did not show any significant CAD .   She has normal left ventricular systolic function by echo in October, 2018. She has moderate mitral stenosis.  There is no changes from previous echocardiograms. Has had more shortness of breath over the past week Has been weeding her garden .  Put out pine straw [name] thinks she is able to do lot of work for someone age 41.    Aug. 19, 2022: Frances Mendoza is seen today for follow up of her MV endocarditis , septic emboli. Seen with Daughter, [name] . Seems to be getting better.  Echo showed mod-severe mitral stenosis Also HOCM We started metoprolol but she stopped it . Swe discussed TEE but she does not want to do right now .  Works in her yard.    Current Outpatient Medications on File Prior to Visit  Medication Sig Dispense Refill   acetaminophen (TYLENOL) 500 MG tablet Take 500 mg by mouth every 6 (six) hours as needed.     albuterol (PROVENTIL HFA;VENTOLIN HFA) 108 (90 BASE) MCG/ACT inhaler Inhale 2  puffs into the lungs every 6 (six) hours as needed for wheezing or shortness of breath.     Ascorbic Acid (VITAMIN C PO) Take 50 mg by mouth daily.      aspirin 81 MG EC tablet Take 81 mg by mouth daily.     azelastine (ASTELIN) 0.1 % nasal spray Place into both nostrils daily as needed for rhinitis. Use in each nostril as directed     azithromycin (ZITHROMAX) 500 MG tablet Take one tablet by mouth one hour prior to dental procedures. 10 tablet 3   benzonatate (TESSALON) 200 MG capsule Take 200 mg by mouth 3 (three) times daily as needed.  0   Cyanocobalamin (VITAMIN B12 PO) Take 1,000 mg by mouth daily.      diazepam (VALIUM) 5 MG tablet Take 5 mg by mouth every 8 (eight) hours as needed for anxiety. 3 daily     donepezil (ARICEPT) 10 MG tablet Take 1 tablet (10 mg total) by mouth daily. 90 tablet 4   fluticasone (FLONASE) 50 MCG/ACT nasal spray Place 1 spray into the nose 2 (two) times daily.     Magnesium 250 MG TABS Take 400 mg by mouth daily.      meclizine (ANTIVERT) 25 MG tablet Take 25 mg by mouth 3 (three) times daily as needed for dizziness.     memantine (NAMENDA) 10 MG tablet Take 1 tablet (10 mg total) by mouth 2 (two) times daily. 180 tablet 4   metoprolol tartrate (LOPRESSOR) 25 MG tablet Take 1 tablet (25 mg total) by mouth 2 (two) times daily. 180 tablet 3   Misc Natural Products (TART CHERRY ADVANCED PO) Take by mouth daily.     nitroGLYCERIN (NITROSTAT) 0.4 MG SL tablet Place 1 tablet (0.4 mg total) under the tongue every 5 (five) minutes x 3 doses as needed for chest pain. 25 tablet 11   sertraline (ZOLOFT) 100 MG tablet Take 1 tablet by mouth daily.     [DISCONTINUED] potassium chloride (K-DUR,KLOR-CON) 10 MEQ tablet TAKE 2 TABLETS BY MOUTH DAILY 60 tablet 0   No current facility-administered medications on file prior to visit.    Allergies  Allergen Reactions   Amoxicillin Anaphylaxis   Penicillins Anaphylaxis   Tolectin [Tolmetin Sodium] Anaphylaxis    Took at same  time as amoxicillin when she had anaphylactic reaction.  Tolmetin Anaphylaxis   Ciprofloxacin Rash    Entire body looked as if sunburned, then skin peeled off.   Statins Other (See Comments)    Leg pain   Ioxaglate Hives    Patient had severe hives after Myelogram.  No issues breathing.  Returned for  a nerve root injection, was given Benadryl, mentioned some minor itching to daughter on the way home but no further issues. Was given a Benadryl today for this injection.    Other Other (See Comments)   Prolactin    Bactrim [Sulfamethoxazole-Trimethoprim] Rash    ? Early rash on palms   Codeine Nausea And Vomiting   Contrast Media [Iodinated Diagnostic Agents] Hives    Patient had severe hives after Myelogram.  No issues breathing.  Returned for  a nerve root injection, was given Benadryl, mentioned some minor itching to daughter on the way home but no further issues. Was given a Benadryl today for this injection.    Imipenem Rash   Megestrol Rash   Procardia [Nifedipine] Other (See Comments)    Causes aches down legs   Vancomycin Rash    Past Medical History:  Diagnosis Date   Allergy    Anxiety    Arthritis    osteoarthritis   Back problem    CAD (coronary artery disease)    a. 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 upper doesn't fit    Past Surgical History:  Procedure Laterality Date   ABDOMINAL HYSTERECTOMY  1979   full   bladder tuck  2007?   BROW LIFT Bilateral 03/06/2016   Procedure: BLEPHAROPLASTY REPAIR, RESECT EX;   Surgeon: Imagene Riches, MD;  Location: [facility];  Service: Ophthalmology;  Laterality: Bilateral;   CHOLECYSTECTOMY  09/29/1999   LEFT HEART CATHETERIZATION WITH CORONARY ANGIOGRAM N/A 05/23/2012   Procedure: LEFT HEART CATHETERIZATION WITH CORONARY ANGIOGRAM;  Surgeon: Rollene Rotunda, MD;  Location: [facility] CATH LAB;  Service: Cardiovascular;  Laterality: N/A;   TEE WITHOUT CARDIOVERSION  10/10/2010    done with suggestion of small mitral vegetation.  ID was consulted for input and ID feels  patient with possible endocarditis with septic emboli    Social History   Tobacco Use  Smoking Status Never  Smokeless Tobacco Never    Social History   Substance and Sexual Activity  Alcohol Use No    Family History  Problem Relation Age of Onset   Coronary artery disease Unknown        family history of   Colon cancer Unknown        family history of   Cerebral palsy Sister    Colon cancer Brother    Prostate cancer Brother     Reviw of Systems:  Reviewed in the HPI.  All other systems are negative.  Physical Exam: Blood pressure 128/74, pulse 77, height 5\' 2"  (1.575 m), weight 139 lb (63 kg), SpO2 98 %.  GEN:  Well nourished, well developed in  no acute distress HEENT: Normal NECK: No JVD; No carotid bruits LYMPHATICS: No lymphadenopathy CARDIAC: RRR, 2/6 systolic murmur,  very soft diastolic murmur  RESPIRATORY:  Clear to auscultation without rales, wheezing or rhonchi  ABDOMEN: Soft, non-tender, non-distended MUSCULOSKELETAL:  No edema; No deformity  SKIN: Warm and dry NEUROLOGIC:  Alert and oriented x 3    ECG:     Assessment / Plan:   1. Endocarditis -    She has a active prescription for azithromycin 500 mg to be given 1 hour prior to dental procedures.  2.  Mitral stenosis / mitral regurgitation.    I suspect she has scarring from her endocardits .  She has moderate mitral stenosis as well as mild mitral gravitation.  She says she feels fine and is not  inclined to have any further procedures.  I agree that a mitral valve replacement in this 85 year old patient would be a fairly large procedure.  For now she is quite content to continue as she has and we can reconsider if she develops any symptoms.  2.  Septic emboli leading to stroke  3.   TIA -     4. Essential HTN:    BP is well controlled.   5. Hyperlipidemia :     6.  Question of  MI :      Kristeen Miss, MD  10/07/2020 11:40 AM    [facility] [address] [place],  [address] [place], [place]  [address] Pager [phone] Phone: [phone]; Fax: [phone]

## 2020-10-07 ENCOUNTER — Encounter (INDEPENDENT_AMBULATORY_CARE_PROVIDER_SITE_OTHER): Payer: Self-pay

## 2020-10-07 ENCOUNTER — Ambulatory Visit (INDEPENDENT_AMBULATORY_CARE_PROVIDER_SITE_OTHER): Payer: Medicare Other | Admitting: Cardiovascular Disease

## 2020-10-07 ENCOUNTER — Other Ambulatory Visit: Payer: Self-pay

## 2020-10-07 ENCOUNTER — Encounter: Payer: Self-pay | Admitting: Cardiovascular Disease

## 2020-10-07 VITALS — BP 128/74 | HR 77 | Ht 62.0 in | Wt 139.0 lb

## 2020-10-07 DIAGNOSIS — I340 Nonrheumatic mitral (valve) insufficiency: Secondary | ICD-10-CM | POA: Diagnosis not present

## 2020-10-07 NOTE — Patient Instructions (Signed)

## 2021-09-20 ENCOUNTER — Other Ambulatory Visit: Payer: Self-pay | Admitting: Neurology

## 2021-09-23 ENCOUNTER — Other Ambulatory Visit: Payer: Self-pay | Admitting: Neurology

## 2021-09-29 ENCOUNTER — Other Ambulatory Visit: Payer: Self-pay | Admitting: Neurology

## 2021-09-29 ENCOUNTER — Telehealth: Payer: Self-pay | Admitting: Neurology

## 2021-09-29 NOTE — Telephone Encounter (Signed)
Pt's daughter has scheduled pt's annual f/u and is now asking if pt can have enough medication memantine (NAMENDA) 10 MG tablet called in to [id number] until appointment date, please call.

## 2021-10-02 MED ORDER — MEMANTINE HCL 10 MG PO TABS: 10.0000 mg | ORAL_TABLET | Freq: Two times a day (BID) | ORAL | 0 refills | Status: DC

## 2021-10-02 NOTE — Telephone Encounter (Signed)
Rx filled for 90 day supply. 

## 2021-10-03 ENCOUNTER — Ambulatory Visit (INDEPENDENT_AMBULATORY_CARE_PROVIDER_SITE_OTHER): Payer: Medicare Other | Admitting: Cardiovascular Disease

## 2021-10-03 ENCOUNTER — Ambulatory Visit: Payer: Medicare Other | Admitting: Cardiovascular Disease

## 2021-10-03 ENCOUNTER — Encounter: Payer: Self-pay | Admitting: Cardiovascular Disease

## 2021-10-03 ENCOUNTER — Encounter: Payer: Self-pay | Admitting: Neurology

## 2021-10-03 ENCOUNTER — Ambulatory Visit (INDEPENDENT_AMBULATORY_CARE_PROVIDER_SITE_OTHER): Payer: Medicare Other | Admitting: Neurology

## 2021-10-03 VITALS — BP 124/65 | HR 77 | Ht 62.0 in | Wt 133.0 lb

## 2021-10-03 VITALS — BP 153/76 | HR 71 | Ht 62.0 in | Wt 134.5 lb

## 2021-10-03 DIAGNOSIS — R4189 Other symptoms and signs involving cognitive functions and awareness: Secondary | ICD-10-CM | POA: Diagnosis not present

## 2021-10-03 DIAGNOSIS — I340 Nonrheumatic mitral (valve) insufficiency: Secondary | ICD-10-CM

## 2021-10-03 DIAGNOSIS — Z8673 Personal history of transient ischemic attack (TIA), and cerebral infarction without residual deficits: Secondary | ICD-10-CM

## 2021-10-03 DIAGNOSIS — I059 Rheumatic mitral valve disease, unspecified: Secondary | ICD-10-CM | POA: Diagnosis not present

## 2021-10-03 MED ORDER — MEMANTINE HCL 10 MG PO TABS: 10.0000 mg | ORAL_TABLET | Freq: Two times a day (BID) | ORAL | 3 refills | Status: DC

## 2021-10-03 MED ORDER — DONEPEZIL HCL 10 MG PO TABS: 10.0000 mg | ORAL_TABLET | Freq: Every day | ORAL | 4 refills | Status: DC

## 2021-10-03 NOTE — Progress Notes (Signed)
[facility]  ASSESSMENT AND PLAN  86 y.o. year old female  Mild cognitive impairment  Overall stable, Mini-Mental Status Examination is 29/30  Refilled her prescription of Aricept 10 mg daily, Namenda 10 mg twice a day.   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   No acute infarct or intracranial hemorrhage.   Remote left temporal lobe infarct. Remote bilateral centrum semiovale/corona radiata small infarcts. Remote small left thalamic infarct.   Progressive prominent white matter changes most consistent with result of chronic microvascular changes.   Transverse ligament hypertrophy. Cervical spondylotic changes with spinal stenosis and various degrees of cord flattening C3-4 thru C5-6.   Maxillary sinus mucosal thickening greater on left with there is a small air-fluid level raising possibility of acute sinusitis.  HISTORY OF PRESENT ILLNESS: Frances Mendoza is a 86 years old right-handed female, accompanied by her daughter [name] for evaluation of short-term memory trouble, last visit was September 2015    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, presented with confusion, delirium, gait difficulty, TEE showed ejection fraction 65%, calcified mitral valve, no vegetation, was diagnosed with a septic endocarditis, was treated with prolonged antibiotics, antiplatelet agent, also with non-ST elevation MI,  she also had past medical history of hypertension, anxiety, hyperlipidemia, she had a high school graduate, lives at home with her husband,   Ever since that event she has become much less active, only driving occasionally at short distance, also has short-term memory trouble, occasionally confusion, no longer cooking, she also complains of chronic low back pain, long-standing history of depression anxiety, she is taking Valium 5 mg 3 times a day, her husband has taken over her prescription medication management, but she tends to take a lot of supplements, as needed Tylenol, ibuprofen,   On May 24th 2014, she woke up was noticed by her family to have confusion, unsteady gait, lasting for 24 hours, no seizure activity was noted, she was admitted to the hospital, May 24th through 27th, 2014.   MRI head again showed acute infarction: Two or three punctate acute infarctions in the left frontal region affecting the caudate head and left frontal lobe.  No swelling, hemorrhage or mass effect.  Moderate chronic small vessel changes elsewhere throughout the brain.     MRA HEAD: No major vessel occlusion or correctable proximal stenosis. Repeat echocardiogram showed ejection fraction 65%, calcified mitral valve, no vegetations, EEG was normal, she is not back to her normal self, very argumentary, especially to her daughter. Ultrasound of carotid artery showed no large vessel disease   Mother died age 6 from stroke, father died age 38 from heart attack, no family history of dementia   UPDATE Sept 28th 2015: Her husband is helping with her medications, she is alone at visit, she is overall doing well, thinking she is improving. She works in her garden a lot, still active at home, cooking Sunday lunch for her extended family.  She has no trouble walking, she fell  sometimes when turning quickly   UPDATE Mar 21 2015: She is with her daughter at visit, she had bad cold in Dec 2016, take few weeks to recover, she  complains of gradual worsening memory loss, but she still very active, she still drives, no gait difficulties, she has chronic insomnia, taking Valium 5 mg half to one tablets every night as needed  Update February 17, 2019: She has lost followed up by couple years, overall stable, still lives alone, driving, taking care of her household without much difficulty, today's Mini-Mental Status Examination 29 out of 30, she is taking Namenda 10 mg twice a day, and Aricept 10 mg daily  UPDATE Jun 28 2020: She live by herself, still drive short distance, her husband passed away in December 06, 2019, she still graves about it, she has no appetite, just had all teeth pulled out,   She is with her son Iantha Fallen at today's visit, stated that she has worsening memory loss, mild depression,but still busy all day long, she still cook few times for everybody after Sunday worship  She started to sleep better, taking valium prn, she had been using variable dose of Valium for more than few decades, She is also taking zoloft 100mg  qam since her stroke in 2012  UPDATE August 15th 2023: She is with her daughter 03-25-1975 at today's visit, she is overall doing well still lives alone, very active in her garden, she fell in few months ago, hurt her back, still complains of left knee pain is under the care of local orthopedic surgeon, denies significant gait abnormality other than the new medication from her pain, denies bowel and bladder incontinence  She was treated with Zoloft up to 100 mg for her depression, which she reported significant improvement but stopped recently due to her complaints of increased confusion while taking Zoloft, since stopped antidepression, she noticed difficulty sleeping,  She is taking Namenda 10 mg twice a day, Aricept 10 mg daily, reported difference stopped taking medications, and improvement with the medicine   PHYSICAL EXAM  Vitals:   06/28/20 1122  Weight: 140 lb (63.5 kg)  Height: 5\' 2"  (1.575  m)   Body mass index is 25.61 kg/m.  Generalized: Well developed, in no acute distress  Neurological examination    10/03/2021    8:45 AM 06/28/2020   11:35 AM  Montreal Cognitive Assessment   Visuospatial/ Executive (0/5) 4 4  Naming (0/3) 3 2  Attention: Read list of digits (0/2) 2 2  Attention: Read list of letters (0/1) 1 1  Attention: Serial 7 subtraction starting at 100 (0/3) 3 3  Language: Repeat phrase (0/2) 2 2  Language : Fluency (0/1) 0 0  Abstraction (0/2) 2 2  Delayed Recall (0/5) 1 0  Orientation (0/6) 6 6  Total 24 22  Adjusted Score (based on education)  22     PHYSICAL EXAMNIATION:  Gen: NAD, conversant, well nourised, well groomed                     Cardiovascular: Regular rate rhythm, no peripheral edema, warm, nontender. Eyes: Conjunctivae clear without exudates or hemorrhage Neck: Supple, no carotid bruits. Pulmonary: Clear to auscultation bilaterally   NEUROLOGICAL EXAM:  MENTAL STATUS: Speech/cognition: Awake, alert oriented to history taking and casual conversation  CRANIAL NERVES: CN II: Visual fields are full to confrontation.  Pupils are round equal and briskly reactive to light. CN III, IV, VI: extraocular movement are normal. No ptosis. CN V: Facial  sensation is intact to pinprick in all 3 divisions bilaterally. Corneal responses are intact.  CN VII: Face is symmetric with normal eye closure and smile. CN VIII: Hearing is normal to casual conversation CN IX, X: Palate elevates symmetrically. Phonation is normal. CN XI: Head turning and shoulder shrug are intact CN XII: Tongue is midline with normal movements and no atrophy.  MOTOR: Mild deformity of hand joints, no upper or lower extremity weakness,  REFLEXES: Hypoactive and symmetric.  SENSORY: Intact to light touch,  COORDINATION: No truncal ataxia, or limb dysmetria,  GAIT/STANCE: Push-up to get up from seated position, cautious, mildly antalgic   REVIEW OF SYSTEMS: Full 14  system review of systems performed and notable only for those listed, all others are neg:  As above  ALLERGIES: Allergies  Allergen Reactions   Amoxicillin Anaphylaxis   Penicillins Anaphylaxis   Tolectin [Tolmetin Sodium] Anaphylaxis    Took at same time as amoxicillin when she had anaphylactic reaction.   Tolmetin Anaphylaxis   Ciprofloxacin Rash    Entire body looked as if sunburned, then skin peeled off.   Statins Other (See Comments)    Leg pain   Ioxaglate Hives    Patient had severe hives after Myelogram.  No issues breathing.  Returned for  a nerve root injection, was given Benadryl, mentioned some minor itching to daughter on the way home but no further issues. Was given a Benadryl today for this injection.    Other Other (See Comments)   Prolactin    Remeron [Mirtazapine] Other (See Comments)    confusion   Bactrim [Sulfamethoxazole-Trimethoprim] Rash    ? Early rash on palms   Codeine Nausea And Vomiting   Contrast Media [Iodinated Contrast Media] Hives    Patient had severe hives after Myelogram.  No issues breathing.  Returned for  a nerve root injection, was given Benadryl, mentioned some minor itching to daughter on the way home but no further issues. Was given a Benadryl today for this injection.    Imipenem Rash   Megestrol Rash   Procardia [Nifedipine] Other (See Comments)    Causes aches down legs   Vancomycin Rash    HOME MEDICATIONS: Outpatient Medications Prior to Visit  Medication Sig Dispense Refill   acetaminophen (TYLENOL) 500 MG tablet Take 500 mg by mouth every 6 (six) hours as needed.     albuterol (PROVENTIL HFA;VENTOLIN HFA) 108 (90 BASE) MCG/ACT inhaler Inhale 2 puffs into the lungs every 6 (six) hours as needed for wheezing or shortness of breath.     Ascorbic Acid (VITAMIN C PO) Take 50 mg by mouth daily.      aspirin 81 MG EC tablet Take 81 mg by mouth daily.     azelastine (ASTELIN) 0.1 % nasal spray Place into both nostrils daily as needed  for rhinitis. Use in each nostril as directed     azithromycin (ZITHROMAX) 500 MG tablet Take one tablet by mouth one hour prior to dental procedures. 10 tablet 3   benzonatate (TESSALON) 200 MG capsule Take 200 mg by mouth 3 (three) times daily as needed.  0   Cyanocobalamin (VITAMIN B12 PO) Take 1,000 mg by mouth daily.      diazepam (VALIUM) 5 MG tablet Take 5 mg by mouth every 8 (eight) hours as needed for anxiety. 3 daily     donepezil (ARICEPT) 10 MG tablet Take 1 tablet (10 mg total) by mouth daily. 90 tablet 4   fluticasone (FLONASE) 50 MCG/ACT nasal  spray Place 1 spray into the nose 2 (two) times daily.     gabapentin (NEURONTIN) 100 MG capsule Take 100 mg by mouth 2 (two) times daily as needed.     Magnesium 250 MG TABS Take 400 mg by mouth daily.      meclizine (ANTIVERT) 25 MG tablet Take 25 mg by mouth 3 (three) times daily as needed for dizziness.     meloxicam (MOBIC) 15 MG tablet Take 15 mg by mouth daily.     memantine (NAMENDA) 10 MG tablet Take 1 tablet (10 mg total) by mouth 2 (two) times daily. 180 tablet 0   metoprolol tartrate (LOPRESSOR) 25 MG tablet Take 1 tablet (25 mg total) by mouth 2 (two) times daily. 180 tablet 3   Misc Natural Products (TART CHERRY ADVANCED PO) Take by mouth daily.     nitroGLYCERIN (NITROSTAT) 0.4 MG SL tablet Place 1 tablet (0.4 mg total) under the tongue every 5 (five) minutes x 3 doses as needed for chest pain. 25 tablet 11   sertraline (ZOLOFT) 100 MG tablet Take 1 tablet by mouth daily.     mirtazapine (REMERON) 15 MG tablet Take 15 mg by mouth at bedtime. (Patient not taking: Reported on 10/03/2021)     No facility-administered medications prior to visit.    PAST MEDICAL HISTORY: Past Medical History:  Diagnosis Date   Allergy    Anxiety    Arthritis    osteoarthritis   Back problem    CAD (coronary artery disease)    a. admx with CP c/w [place] 4/14 => LHC: pLAD 40%, RCA with lum irregs => med Rx   Cerebrovascular accident, embolic  (HCC) 10/18/2010   Bilateral   Depression    Diverticulosis    Endocarditis 10/18/2010   treated with vancomycin, gentamicin, and Cipro   Hemorrhoids, internal    HLD (hyperlipidemia)    Hypertension    Hypokalemia    Insomnia    Renal insufficiency 11/29/2010   Septic embolism (HCC) 10/18/2010   endocarditis -- per TEE   Vertigo    Wears dentures    partial lower. upper doesn't fit    PAST SURGICAL HISTORY: Past Surgical History:  Procedure Laterality Date   ABDOMINAL HYSTERECTOMY  1979   full   bladder tuck  2007?   BROW LIFT Bilateral 03/06/2016   Procedure: BLEPHAROPLASTY REPAIR, RESECT EX;  Surgeon: Imagene Riches, MD;  Location: [facility];  Service: Ophthalmology;  Laterality: Bilateral;   CHOLECYSTECTOMY  09/29/1999   LEFT HEART CATHETERIZATION WITH CORONARY ANGIOGRAM N/A 05/23/2012   Procedure: LEFT HEART CATHETERIZATION WITH CORONARY ANGIOGRAM;  Surgeon: Rollene Rotunda, MD;  Location: [facility];  Service: Cardiovascular;  Laterality: N/A;   TEE WITHOUT CARDIOVERSION  10/10/2010    done with suggestion of small mitral vegetation.  ID was consulted for input and ID feels  patient with possible endocarditis with septic emboli    FAMILY HISTORY: Family History  Problem Relation Age of Onset   Coronary artery disease Unknown        family history of   Colon cancer Unknown        family history of   Cerebral palsy Sister    Colon cancer Brother    Prostate cancer Brother     SOCIAL HISTORY: Social History   Socioeconomic History   Marital status: Married    Spouse name: Dimas Aguas   Number of children: 3   Years of education: 12   Highest education level:  Not on file  Occupational History    Comment: patient not employed   Occupation: Retired  Tobacco Use   Smoking status: Never   Smokeless tobacco: Never  Substance and Sexual Activity   Alcohol use: No   Drug use: No   Sexual activity: Not Currently  Other Topics Concern   Not on file  Social  History Narrative   Patient lives at home with spouse. Howard   Caffeine Use: 2 cups daily   Patient has 3 children.    Patient is retired.          Social Determinants of Health   Financial Resource Strain: Not on file  Food Insecurity: Not on file  Transportation Needs: Not on file  Physical Activity: Not on file  Stress: Not on file  Social Connections: Not on file  Intimate Partner Violence: Not on file    Total time spent reviewing the chart, obtaining history, examined patient, ordering tests, documentation, consultations and family, care coordination was 40 minutes  Levert Feinstein, M.D. Ph.D.  [facility] [address] [place], [place] [address] Phone: [phone] Fax:      [phone]

## 2021-10-03 NOTE — Progress Notes (Signed)
Frances Mendoza July Date of Birth  1935-04-23 [facility]     [place] [facility]  [address]    [address]   [address] [place], [place]  [address]    [place], [place]  [address] [phone]  Fax  [phone]  [phone]  Fax [phone]   Problem list 1. Mitral Valve Endocarditis - 2012 2.  Septic emboli leading to stroke   Previous notes:   Frances Mendoza is a 86 yo with a hx of bacterial endocarditis with subsequent septic emboli and CVA.  She was hospitalized in November for CP.  She ruled out for MI.  She complains of lots of pain in the back of both hips radiating down to her legs. She thinks may be related to one of her medications. He typically hurts worse when she stands up and walks around. It does not hurt at night when she is lying in bed.  The pain occurs whenever she stands or walks - not when she is lying down.  She denies any cardiac issues.  May 23, 2012  Frances Mendoza awoke with chest pain and left pain.  The pain lasted for 1-2 hours.  Her son checked her BP and HR which were normal.    The severe pain has resolved but her chest is still not quite normal.    She has had some pains in her chest in the past but this was more severe.  The pain resolved with a valium.  The pains were no pleuretic.  She had dyspnea associated with the pain.  No diaphoresis.  No syncope.  No palpitations.   No fever/ chills/ blood in urine or stool.   She gets some exercise - she has some dyspnea but no angina.    She has occasional episodes of palptations.  She feels like her heart is racing for 5 - 10 minutes.    She takes the valium 2-3 times a day.    She has chronic back pain and has been told that she needs to have surgery.  She does not want to have surgery.    May 07, 2013;  She is doing OK.  She is here alone today.  Has lots of anxiety. She has multiple general complaints.   Jan. 8, 2016:  Frances Mendoza is seen with her daughter today.  She has had continued  shortness of breath . She had an echo at [facility] which is basically unchanged from our echo She feels well When she went to her medical doctor's office, she had not taken her HCTZ and her BP was elevated.  She was told that she should probably take a different BP med.   Today, she is doing great from a cardiac standpoint.    Aug. 29,. 2016:  She is doing well   Oct. 9, 2017  Frances Mendoza is seen today for follow up of her endocarditis   February 25, 2017: Doing well Planning her garden for this coming spring  Watcher her salt intake   May 27, 2019 Getting some exercise  No CP or dyspnea  Takes Abx before dental work .    June 08, 2020:  Frances Mendoza is seen for follow up of her MV endocarditis.   Seen with daughter, [name]   She had a CVA which was thought to be a septic emboli from the endocarditis She also likely  had embolization down her LAD and RCA ( ? Or LCX)  She had an MI  at the same time.   Had a heart cath years ago which did not show any significant CAD .   She has normal left ventricular systolic function by echo in October, 2018. She has moderate mitral stenosis.  There is no changes from previous echocardiograms. Has had more shortness of breath over the past week Has been weeding her garden .  Put out pine straw [name] thinks she is able to do lot of work for someone age 15.    Aug. 19, 2022: Frances Mendoza is seen today for follow up of her MV endocarditis , septic emboli. Seen with Daughter, [name] . Seems to be getting better.  Echo showed mod-severe mitral stenosis Also HOCM We started metoprolol but she stopped it . Swe discussed TEE but she does not want to do right now .  Works in her yard.    October 03, 2021: That is seen today for follow-up of her mitral valve endocarditis, septic embolic stroke She is seen with daughter Selena Batten  Has some dyspnea,  is somewhat deconditioned  She has stopped her metoprolol   Current Outpatient Medications on File Prior  to Visit  Medication Sig Dispense Refill   acetaminophen (TYLENOL) 500 MG tablet Take 500 mg by mouth every 6 (six) hours as needed.     albuterol (PROVENTIL HFA;VENTOLIN HFA) 108 (90 BASE) MCG/ACT inhaler Inhale 2 puffs into the lungs every 6 (six) hours as needed for wheezing or shortness of breath.     Ascorbic Acid (VITAMIN C PO) Take 50 mg by mouth daily.      aspirin 81 MG EC tablet Take 81 mg by mouth daily.     azelastine (ASTELIN) 0.1 % nasal spray Place into both nostrils daily as needed for rhinitis. Use in each nostril as directed     benzonatate (TESSALON) 200 MG capsule Take 200 mg by mouth 3 (three) times daily as needed.  0   Cyanocobalamin (VITAMIN B12 PO) Take 1,000 mg by mouth daily.      diazepam (VALIUM) 5 MG tablet Take 5 mg by mouth every 8 (eight) hours as needed for anxiety. 3 daily     donepezil (ARICEPT) 10 MG tablet Take 1 tablet (10 mg total) by mouth daily. 90 tablet 4   fluticasone (FLONASE) 50 MCG/ACT nasal spray Place 1 spray into the nose 2 (two) times daily.     Magnesium 250 MG TABS Take 400 mg by mouth daily.      meloxicam (MOBIC) 15 MG tablet Take 15 mg by mouth daily.     memantine (NAMENDA) 10 MG tablet Take 1 tablet (10 mg total) by mouth 2 (two) times daily. 180 tablet 3   Misc Natural Products (TART CHERRY ADVANCED PO) Take by mouth daily.     nitroGLYCERIN (NITROSTAT) 0.4 MG SL tablet Place 1 tablet (0.4 mg total) under the tongue every 5 (five) minutes x 3 doses as needed for chest pain. 25 tablet 11   sertraline (ZOLOFT) 100 MG tablet Take 1 tablet by mouth daily.     azithromycin (ZITHROMAX) 500 MG tablet Take one tablet by mouth one hour prior to dental procedures. (Patient not taking: Reported on 10/03/2021) 10 tablet 3   gabapentin (NEURONTIN) 100 MG capsule Take 100 mg by mouth 2 (two) times daily as needed. (Patient not taking: Reported on 10/03/2021)     meclizine (ANTIVERT) 25 MG tablet Take 25 mg by mouth 3 (three) times daily as needed for  dizziness. (Patient not taking: Reported on  10/03/2021)     metoprolol tartrate (LOPRESSOR) 25 MG tablet Take 1 tablet (25 mg total) by mouth 2 (two) times daily. (Patient not taking: Reported on 10/03/2021) 180 tablet 3   [DISCONTINUED] potassium chloride (K-DUR,KLOR-CON) 10 MEQ tablet TAKE 2 TABLETS BY MOUTH DAILY 60 tablet 0   No current facility-administered medications on file prior to visit.    Allergies  Allergen Reactions   Amoxicillin Anaphylaxis   Penicillins Anaphylaxis   Tolectin [Tolmetin Sodium] Anaphylaxis    Took at same time as amoxicillin when she had anaphylactic reaction.   Tolmetin Anaphylaxis   Ciprofloxacin Rash    Entire body looked as if sunburned, then skin peeled off.   Statins Other (See Comments)    Leg pain   Ioxaglate Hives    Patient had severe hives after Myelogram.  No issues breathing.  Returned for  a nerve root injection, was given Benadryl, mentioned some minor itching to daughter on the way home but no further issues. Was given a Benadryl today for this injection.    Other Other (See Comments)   Prolactin    Remeron [Mirtazapine] Other (See Comments)    confusion   Bactrim [Sulfamethoxazole-Trimethoprim] Rash    ? Early rash on palms   Codeine Nausea And Vomiting   Contrast Media [Iodinated Contrast Media] Hives    Patient had severe hives after Myelogram.  No issues breathing.  Returned for  a nerve root injection, was given Benadryl, mentioned some minor itching to daughter on the way home but no further issues. Was given a Benadryl today for this injection.    Imipenem Rash   Megestrol Rash   Procardia [Nifedipine] Other (See Comments)    Causes aches down legs   Vancomycin Rash    Past Medical History:  Diagnosis Date   Allergy    Anxiety    Arthritis    osteoarthritis   Back problem    CAD (coronary artery disease)    a. admx with CP c/w [place] 4/14 => LHC: pLAD 40%, RCA with lum irregs => med Rx   Cerebrovascular accident, embolic  (HCC) 10/18/2010   Bilateral   Depression    Diverticulosis    Endocarditis 10/18/2010   treated with vancomycin, gentamicin, and Cipro   Hemorrhoids, internal    HLD (hyperlipidemia)    Hypertension    Hypokalemia    Insomnia    Renal insufficiency 11/29/2010   Septic embolism (HCC) 10/18/2010   endocarditis -- per TEE   Vertigo    Wears dentures    partial lower. upper doesn't fit    Past Surgical History:  Procedure Laterality Date   ABDOMINAL HYSTERECTOMY  1979   full   bladder tuck  2007?   BROW LIFT Bilateral 03/06/2016   Procedure: BLEPHAROPLASTY REPAIR, RESECT EX;  Surgeon: Imagene Riches, MD;  Location: [facility];  Service: Ophthalmology;  Laterality: Bilateral;   CHOLECYSTECTOMY  09/29/1999   LEFT HEART CATHETERIZATION WITH CORONARY ANGIOGRAM N/A 05/23/2012   Procedure: LEFT HEART CATHETERIZATION WITH CORONARY ANGIOGRAM;  Surgeon: Rollene Rotunda, MD;  Location: [facility] C[facility] LAB;  Service: Cardiovascular;  Laterality: N/A;   TEE WITHOUT CARDIOVERSION  10/10/2010    done with suggestion of small mitral vegetation.  ID was consulted for input and ID feels  patient with possible endocarditis with septic emboli    Social History   Tobacco Use  Smoking Status Never  Smokeless Tobacco Never    Social History   Substance and Sexual Activity  Alcohol Use No    Family History  Problem Relation Age of Onset   Coronary artery disease Unknown        family history of   Colon cancer Unknown        family history of   Cerebral palsy Sister    Colon cancer Brother    Prostate cancer Brother     Reviw of Systems:  Reviewed in the HPI.  All other systems are negative.  Physical Exam: Blood pressure (!) 140/76, pulse 77, height 5\' 2"  (1.575 m), weight 133 lb (60.3 kg), SpO2 94 %.  GEN:  Well nourished, well developed in no acute distress HEENT: Normal NECK: No JVD; No carotid bruits LYMPHATICS: No lymphadenopathy CARDIAC: RRR ,  2/6 systolic murmur , radiates  to the left ax line  RESPIRATORY:  Clear to auscultation without rales, wheezing or rhonchi  ABDOMEN: Soft, non-tender, non-distended MUSCULOSKELETAL:  No edema; No deformity  SKIN: Warm and dry NEUROLOGIC:  Alert and oriented x 3    ECG:  Aug. 15, 2023:  old Inf. MI,  old ant. MI    Assessment / Plan:   1. Endocarditis -LA has a history of mitral valve endocarditis.  She now has mitral regurgitation and mild mitral stenosis.  She is not a good candidate for surgery and fortunately her mitral valve disease is only mild to moderate.     2.  Mitral stenosis / mitral regurgitation.     2.  Septic emboli leading to stroke and embolic Inf and ant. STEMI   3.   TIA -     4. Essential HTN:    :  BP is elevated on occasion.   Is better today   5. Hyperlipidemia :     6.  Question of  MI :    EKG suggest that she has had an inferior wall MI and a anterior wall MI.  I suspect this was due to septal emboli.  We have never done a heart catheterization  Echocardiogram reveals hyperdynamic LV function.  She has a dynamic LVOT obstruction.  We tried metoprolol but she did not tolerate it.  05-07-1988, MD  10/03/2021 11:27 AM    [facility] [address] [place],  [address] [place], [place]  [place] Pager [phone] Phone: [phone]; Fax: [phone]

## 2021-10-03 NOTE — Patient Instructions (Signed)

## 2022-10-04 ENCOUNTER — Ambulatory Visit (INDEPENDENT_AMBULATORY_CARE_PROVIDER_SITE_OTHER): Payer: Medicare Other | Admitting: Adult Health

## 2022-10-04 ENCOUNTER — Encounter: Payer: Self-pay | Admitting: Adult Health

## 2022-10-04 VITALS — BP 117/65 | HR 72

## 2022-10-04 DIAGNOSIS — F419 Anxiety disorder, unspecified: Secondary | ICD-10-CM | POA: Diagnosis not present

## 2022-10-04 DIAGNOSIS — Z8673 Personal history of transient ischemic attack (TIA), and cerebral infarction without residual deficits: Secondary | ICD-10-CM | POA: Diagnosis not present

## 2022-10-04 DIAGNOSIS — R4189 Other symptoms and signs involving cognitive functions and awareness: Secondary | ICD-10-CM

## 2022-10-04 DIAGNOSIS — F32A Depression, unspecified: Secondary | ICD-10-CM | POA: Diagnosis not present

## 2022-10-04 MED ORDER — MEMANTINE HCL 10 MG PO TABS: 10.0000 mg | ORAL_TABLET | Freq: Two times a day (BID) | ORAL | 3 refills | Status: DC

## 2022-10-04 MED ORDER — DONEPEZIL HCL 10 MG PO TABS: 10.0000 mg | ORAL_TABLET | Freq: Every day | ORAL | 3 refills | Status: DC

## 2022-10-04 NOTE — Patient Instructions (Addendum)
Your Plan:  Continue Aricept 10mg  daily and Namenda 10mg  twice daily  Continue memory exercises routinely and try to increase physical activity and social activity   Continue sertraline 100mg  daily - consider increasing dose with PCP if you continue to have anxiety  Would recommend sleep evaluation to evaluate for sleep apnea which could be contributing to fatigue, difficulty sleeping at night, headaches and memory difficulties     Follow up in 1 year or call earlier if needed      Thank you for coming to see Frances Mendoza at [facility]. I hope we have been able to provide you high quality care today.  You may receive a patient satisfaction survey over the next few weeks. We would appreciate your feedback and comments so that we may continue to improve ourselves and the health of our patients.     Sleep Apnea Sleep apnea affects breathing during sleep. It causes breathing to stop for 10 seconds or more, or to become shallow. People with sleep apnea usually snore loudly. It can also increase the risk of: Heart attack. Stroke. Being very overweight (obese). Diabetes. Heart failure. Irregular heartbeat. High blood pressure. The goal of treatment is to help you breathe normally again. What are the causes?  The most common cause of this condition is a collapsed or blocked airway. There are three kinds of sleep apnea: Obstructive sleep apnea. This is caused by a blocked or collapsed airway. Central sleep apnea. This happens when the brain does not send the right signals to the muscles that control breathing. Mixed sleep apnea. This is a combination of obstructive and central sleep apnea. What increases the risk? Being overweight. Smoking. Having a small airway. Being older. Being female. Drinking alcohol. Taking medicines to calm yourself (sedatives or tranquilizers). Having family members with the condition. Having a tongue or tonsils that are larger than  normal. What are the signs or symptoms? Trouble staying asleep. Loud snoring. Headaches in the morning. Waking up gasping. Dry mouth or sore throat in the morning. Being sleepy or tired during the day. If you are sleepy or tired during the day, you may also: Not be able to focus your mind (concentrate). Forget things. Get angry a lot and have mood swings. Feel sad (depressed). Have changes in your personality. Have less interest in sex, if you are female. Be unable to have an erection, if you are female. How is this treated?  Sleeping on your side. Using a medicine to get rid of mucus in your nose (decongestant). Avoiding the use of alcohol, medicines to help you relax, or certain pain medicines (narcotics). Losing weight, if needed. Changing your diet. Quitting smoking. Using a machine to open your airway while you sleep, such as: An oral appliance. This is a mouthpiece that shifts your lower jaw forward. A CPAP device. This device blows air through a mask when you breathe out (exhale). An EPAP device. This has valves that you put in each nostril. A BIPAP device. This device blows air through a mask when you breathe in (inhale) and breathe out. Having surgery if other treatments do not work. Follow these instructions at home: Lifestyle Make changes that your doctor recommends. Eat a healthy diet. Lose weight if needed. Avoid alcohol, medicines to help you relax, and some pain medicines. Do not smoke or use any products that contain nicotine or tobacco. If you need help quitting, ask your doctor. General instructions Take over-the-counter and prescription medicines only as told by your doctor.  If you were given a machine to use while you sleep, use it only as told by your doctor. If you are having surgery, make sure to tell your doctor you have sleep apnea. You may need to bring your device with you. Keep all follow-up visits. Contact a doctor if: The machine that you were  given to use during sleep bothers you or does not seem to be working. You do not get better. You get worse. Get help right away if: Your chest hurts. You have trouble breathing in enough air. You have an uncomfortable feeling in your back, arms, or stomach. You have trouble talking. One side of your body feels weak. A part of your face is hanging down. These symptoms may be an emergency. Get help right away. Call your local emergency services (911 in the [place] Do not wait to see if the symptoms will go away. Do not drive yourself to the hospital. Summary This condition affects breathing during sleep. The most common cause is a collapsed or blocked airway. The goal of treatment is to help you breathe normally while you sleep. This information is not intended to replace advice given to you by your health care provider. Make sure you discuss any questions you have with your health care provider. Document Revised: 09/14/2020 Document Reviewed: 01/15/2020 Elsevier Patient Education  2024 [employer].

## 2022-10-04 NOTE — Progress Notes (Signed)
[facility]    Chief Complaint  Patient presents with   Follow-up    Rm 8, here with daughter Frances Mendoza  Pt here for history of CVA follow up. Pt states she feels her memory is declining. States she is forgetting things and where she has placed them.       ASSESSMENT AND PLAN  87 y.o. year old female  Mild cognitive impairment  Subjective gradual short-term memory decline  MMSE today 22/30 (prior 24/30) Continue Aricept and Namenda Discussed increasing memory exercises as well as increasing physical activity and socialization  Anxiety/depression  Possibly contributing to cognitive impairment  Currently on sertraline 100 mg daily managed by PCP, discussed potentially increasing dose as she continues to struggle with both anxiety and depression  Daytime fatigue  Also with longstanding history of insomnia, snoring and occasional headaches  Discussed pursuing sleep evaluation to rule out sleep apnea but declines interest at this time  Discussed risk of untreated sleep apnea especially with prior stroke history as well as possibly impacting cognition  She will further consider and call office if interested in the future  History of stroke  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   No acute infarct or intracranial hemorrhage.   Remote left temporal lobe infarct. Remote bilateral centrum semiovale/corona radiata small infarcts. Remote small left  thalamic infarct.   Progressive prominent white matter changes most consistent with result of chronic microvascular changes.   Transverse ligament hypertrophy. Cervical spondylotic changes with spinal stenosis and various degrees of cord flattening C3-4 thru C5-6.   Maxillary sinus mucosal thickening greater on left with there is a small air-fluid level raising possibility of acute sinusitis.    HISTORY OF PRESENT ILLNESS: Frances Mendoza is a 87 years old right-handed female, accompanied by her daughter Frances Mendoza for evaluation of short-term memory trouble, last visit was September 2015    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, presented with confusion, delirium, gait difficulty, TEE showed ejection fraction 65%, calcified mitral valve, no vegetation, was diagnosed with a septic endocarditis, was treated with prolonged antibiotics, antiplatelet agent, also with non-ST elevation MI, she also had past medical history of hypertension, anxiety, hyperlipidemia, she had a high school graduate, lives at home with her husband,   Ever since that event she has become much less active, only driving occasionally at short distance, also has short-term memory trouble, occasionally confusion, no longer cooking, she also complains of chronic low back pain, long-standing history of depression anxiety, she is taking Valium 5 mg 3 times a day, her husband has taken over her prescription medication management, but she tends to take a lot of supplements, as needed Tylenol, ibuprofen,   On May 24th 2014, she woke up was noticed by her family to have confusion, unsteady gait, lasting for 24 hours, no seizure activity was noted, she was admitted to the hospital, May 24th through 27th, 2014.   MRI head again showed acute infarction: Two or three  punctate acute infarctions in the left frontal region affecting the caudate head and left frontal lobe.  No swelling, hemorrhage or mass  effect.  Moderate chronic small vessel changes elsewhere throughout the brain.     MRA HEAD: No major vessel occlusion or correctable proximal stenosis. Repeat echocardiogram showed ejection fraction 65%, calcified mitral valve, no vegetations, EEG was normal, she is not back to her normal self, very argumentary, especially to her daughter. Ultrasound of carotid artery showed no large vessel disease   Mother died age 88 from stroke, father died age 14 from heart attack, no family history of dementia   UPDATE Sept 28th 2015: Her husband is helping with her medications, she is alone at visit, she is overall doing well, thinking she is improving. She works in her garden a lot, still active at home, cooking Sunday lunch for her extended family.  She has no trouble walking, she fell sometimes when turning quickly   UPDATE Mar 21 2015: She is with her daughter at visit, she had bad cold in Dec 2016, take few weeks to recover, she complains of gradual worsening memory loss, but she still very active, she still drives, no gait difficulties, she has chronic insomnia, taking Valium 5 mg half to one tablets every night as needed  Update February 17, 2019: She has lost followed up by couple years, overall stable, still lives alone, driving, taking care of her household without much difficulty, today's Mini-Mental Status Examination 29 out of 30, she is taking Namenda 10 mg twice a day, and Aricept 10 mg daily  UPDATE Jun 28 2020: She live by herself, still drive short distance, her husband passed away in 2019-11-19, she still graves about it, she has no appetite, just had all teeth pulled out,   She is with her son Frances Mendoza at today's visit, stated that she has worsening memory loss, mild depression,but still busy all day long, she still cook few times for everybody after Sunday worship  She started to sleep better, taking valium prn, she had been using variable dose of Valium for more than few decades, She  is also taking zoloft 100mg  qam since her stroke in 2012  UPDATE August 15th 2023: She is with her daughter Frances Mendoza at today's visit, she is overall doing well still lives alone, very active in her garden, she fell in few months ago, hurt her back, still complains of left knee pain is under the care of local orthopedic surgeon, denies significant gait abnormality other than the new medication from her pain, denies bowel and bladder incontinence  She was treated with Zoloft up to 100 mg for her depression, which she reported significant improvement but stopped recently due to her complaints of increased confusion while taking Zoloft, since stopped antidepression, she noticed difficulty sleeping,  She is taking Namenda 10 mg twice a day, Aricept 10 mg daily, reported difference stopped taking medications, and improvement with the medicine    Update 10/04/2022 JM: Patient returns for 1 year follow-up visit accompanied by her daughter Frances Mendoza.  Feels like her memory has been declining, easily forgetting things and misplacing items.  Greater difficulty with short term memory. Does word searches all the time, has been doing for years.  Does not do any other type of memory exercises.  She was alone.  Does not leave her house much, limited social interaction.  Occasional insomnia, will use Valium as needed with benefit.  Does complain daytime fatigue as well as snoring and occasional morning  headaches.  Appetite has been okay, usually eats 3 meals a day.  Continues on Aricept 10mg  daily and Namenda 10mg  AM but difficulty taking nighttime dose as it interferes with sleep, tries to take mid afternoon but occasionally forgets.  Continues to struggle with anxiety and depression, currently on sertraline 100 mg daily managed by PCP       PHYSICAL EXAM  Vitals:   06/28/20 1122  Weight: 140 lb (63.5 kg)  Height: 5\' 2"  (1.575 m)   Body mass index is 25.61 kg/m.  Generalized: Well developed, in no acute distress   Neurological examination    10/04/2022   10:43 AM 10/03/2021    8:45 AM 06/28/2020   11:35 AM  Montreal Cognitive Assessment   Visuospatial/ Executive (0/5) 5 4 4   Naming (0/3) 2 3 2   Attention: Read list of digits (0/2) 2 2 2   Attention: Read list of letters (0/1) 1 1 1   Attention: Serial 7 subtraction starting at 100 (0/3) 3 3 3   Language: Repeat phrase (0/2) 1 2 2   Language : Fluency (0/1) 0 0 0  Abstraction (0/2) 2 2 2   Delayed Recall (0/5) 0 1 0  Orientation (0/6) 6 6 6   Total 22 24 22   Adjusted Score (based on education)   22      PHYSICAL EXAMNIATION:  Gen: NAD, conversant, well nourised, well groomed                     Cardiovascular: Regular rate rhythm, no peripheral edema, warm, nontender. Eyes: Conjunctivae clear without exudates or hemorrhage Neck: Supple, no carotid bruits. Pulmonary: Clear to auscultation bilaterally   NEUROLOGICAL EXAM:  MENTAL STATUS: Speech/cognition: Awake, alert oriented to history taking and casual conversation  CRANIAL NERVES: CN II: Visual fields are full to confrontation.  Pupils are round equal and briskly reactive to light. CN III, IV, VI: extraocular movement are normal. No ptosis. CN V: Facial sensation is intact to pinprick in all 3 divisions bilaterally. Corneal responses are intact.  CN VII: Face is symmetric with normal eye closure and smile. CN VIII: Hearing is normal to casual conversation CN IX, X: Palate elevates symmetrically. Phonation is normal. CN XI: Head turning and shoulder shrug are intact CN XII: Tongue is midline with normal movements and no atrophy.  MOTOR: Mild deformity of hand joints, no upper or lower extremity weakness,  REFLEXES: Hypoactive and symmetric.  SENSORY: Intact to light touch,  COORDINATION: No truncal ataxia, or limb dysmetria,  GAIT/STANCE: Push-up to get up from seated position, cautious, mildly antalgic   REVIEW OF SYSTEMS: Full 14 system review of systems performed and  notable only for those listed, all others are neg:  As above  ALLERGIES: Allergies  Allergen Reactions   Amoxicillin Anaphylaxis   Penicillins Anaphylaxis   Tolectin [Tolmetin Sodium] Anaphylaxis    Took at same time as amoxicillin when she had anaphylactic reaction.   Tolmetin Anaphylaxis   Ciprofloxacin Rash    Entire body looked as if sunburned, then skin peeled off.   Statins Other (See Comments)    Leg pain   Ioxaglate Hives    Patient had severe hives after Myelogram.  No issues breathing.  Returned for  a nerve root injection, was given Benadryl, mentioned some minor itching to daughter on the way home but no further issues. Was given a Benadryl today for this injection.    Other Other (See Comments)   Prolactin    Remeron [Mirtazapine] Other (See Comments)  confusion   Bactrim [Sulfamethoxazole-Trimethoprim] Rash    ? Early rash on palms   Codeine Nausea And Vomiting   Contrast Media [Iodinated Contrast Media] Hives    Patient had severe hives after Myelogram.  No issues breathing.  Returned for  a nerve root injection, was given Benadryl, mentioned some minor itching to daughter on the way home but no further issues. Was given a Benadryl today for this injection.    Imipenem Rash   Megestrol Rash   Procardia [Nifedipine] Other (See Comments)    Causes aches down legs   Vancomycin Rash    HOME MEDICATIONS: Outpatient Medications Prior to Visit  Medication Sig Dispense Refill   acetaminophen (TYLENOL) 500 MG tablet Take 500 mg by mouth every 6 (six) hours as needed.     Ascorbic Acid (VITAMIN C PO) Take 50 mg by mouth daily.      aspirin 81 MG EC tablet Take 81 mg by mouth daily.     Cyanocobalamin (VITAMIN B12 PO) Take 1,000 mg by mouth daily.      diazepam (VALIUM) 5 MG tablet Take 5 mg by mouth every 8 (eight) hours as needed for anxiety. 3 daily     donepezil (ARICEPT) 10 MG tablet Take 1 tablet (10 mg total) by mouth daily. 90 tablet 4   meloxicam (MOBIC) 15 MG  tablet Take 15 mg by mouth daily.     memantine (NAMENDA) 10 MG tablet Take 1 tablet (10 mg total) by mouth 2 (two) times daily. 180 tablet 3   Misc Natural Products (TART CHERRY ADVANCED PO) Take by mouth daily.     sertraline (ZOLOFT) 100 MG tablet Take 1 tablet by mouth daily.     azithromycin (ZITHROMAX) 500 MG tablet Take one tablet by mouth one hour prior to dental procedures. (Patient not taking: Reported on 10/03/2021) 10 tablet 3   fluticasone (FLONASE) 50 MCG/ACT nasal spray Place 1 spray into the nose 2 (two) times daily. (Patient not taking: Reported on 10/04/2022)     Magnesium 250 MG TABS Take 400 mg by mouth daily.  (Patient not taking: Reported on 10/04/2022)     meclizine (ANTIVERT) 25 MG tablet Take 25 mg by mouth 3 (three) times daily as needed for dizziness. (Patient not taking: Reported on 10/03/2021)     metoprolol tartrate (LOPRESSOR) 25 MG tablet Take 1 tablet (25 mg total) by mouth 2 (two) times daily. (Patient not taking: Reported on 10/03/2021) 180 tablet 3   nitroGLYCERIN (NITROSTAT) 0.4 MG SL tablet Place 1 tablet (0.4 mg total) under the tongue every 5 (five) minutes x 3 doses as needed for chest pain. (Patient not taking: Reported on 10/04/2022) 25 tablet 11   albuterol (PROVENTIL HFA;VENTOLIN HFA) 108 (90 BASE) MCG/ACT inhaler Inhale 2 puffs into the lungs every 6 (six) hours as needed for wheezing or shortness of breath.     azelastine (ASTELIN) 0.1 % nasal spray Place into both nostrils daily as needed for rhinitis. Use in each nostril as directed     benzonatate (TESSALON) 200 MG capsule Take 200 mg by mouth 3 (three) times daily as needed.  0   gabapentin (NEURONTIN) 100 MG capsule Take 100 mg by mouth 2 (two) times daily as needed. (Patient not taking: Reported on 10/03/2021)     No facility-administered medications prior to visit.    PAST MEDICAL HISTORY: Past Medical History:  Diagnosis Date   Allergy    Anxiety    Arthritis    osteoarthritis  Back problem     CAD (coronary artery disease)    a. 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 upper doesn't fit    PAST SURGICAL HISTORY: Past Surgical History:  Procedure Laterality Date   ABDOMINAL HYSTERECTOMY  1979   full   bladder tuck  2007?   BROW LIFT Bilateral 03/06/2016   Procedure: BLEPHAROPLASTY REPAIR, RESECT EX;  Surgeon: Imagene Riches, MD;  Location: [facility];  Service: Ophthalmology;  Laterality: Bilateral;   CHOLECYSTECTOMY  09/29/1999   LEFT HEART CATHETERIZATION WITH CORONARY ANGIOGRAM N/A 05/23/2012   Procedure: LEFT HEART CATHETERIZATION WITH CORONARY ANGIOGRAM;  Surgeon: Rollene Rotunda, MD;  Location: [facility];  Service: Cardiovascular;  Laterality: N/A;   TEE WITHOUT CARDIOVERSION  10/10/2010    done with suggestion of small mitral vegetation.  ID was consulted for input and ID feels  patient with possible endocarditis with septic emboli    FAMILY HISTORY: Family History  Problem Relation Age of Onset   Coronary artery disease Unknown        family history of   Colon cancer Unknown        family history of   Cerebral palsy Sister    Colon cancer Brother    Prostate cancer Brother     SOCIAL HISTORY: Social History   Socioeconomic History   Marital status: Married    Spouse name: Dimas Aguas   Number of children: 3   Years of education: 12   Highest education level: Not on file  Occupational History    Comment: patient not employed   Occupation: Retired  Tobacco Use   Smoking status: Never   Smokeless tobacco:  Never  Substance and Sexual Activity   Alcohol use: No   Drug use: No   Sexual activity: Not Currently  Other Topics Concern   Not on file  Social History Narrative   Patient lives at home with spouse. Howard   Caffeine Use: 2 cups daily   Patient has 3 children.    Patient is retired.          Social Determinants of Health   Financial Resource Strain: Low Risk  (01/23/2021)   Received from [facility]   Overall Financial Resource Strain (CARDIA)  Food Insecurity: No Food Insecurity (01/23/2021)   Received from [facility]   Hunger Vital Sign  Transportation Needs: No Transportation Needs (01/24/2022)   Received from [name], [facility]   [facility] - Transportation    Lack of Transportation (Medical): No    Lack of Transportation (Non-Medical): No  Physical Activity: Inactive (01/23/2021)   Received from [facility], [facility]   Exercise Vital Sign    Days of Exercise per Week: 0 days    Minutes of Exercise per Session: 0 min  Stress: No Stress Concern Present (01/23/2021)   Received from [facility], [facility] Occupational [facility] -  Occupational Stress Questionnaire    Feeling of Stress : Only a little  Social Connections: Moderately Integrated (01/23/2021)   Received from [facility]   Social Connection and Isolation Panel [NHANES]  Intimate Partner Violence: Not At Risk (01/23/2021)   Received from [facility]   Humiliation, Afraid, Rape, and Kick questionnaire    I spent 40 minutes of face-to-face and non-face-to-face time with patient and daughter.  This included previsit chart review, lab review, study review, order entry, electronic health record documentation, patient education and discussion regarding above diagnoses and treatment plan and answered all other questions to patient and daughters satisfaction  Ihor Austin, [facility]  [facility] [address] [place], [place] [address]  Phone [phone] Fax [phone] Note: This document was prepared with digital dictation and possible smart phrase technology. Any transcriptional errors that result from this process are unintentional.

## 2022-11-12 ENCOUNTER — Ambulatory Visit (INDEPENDENT_AMBULATORY_CARE_PROVIDER_SITE_OTHER): Payer: Medicare Other

## 2022-11-12 VITALS — BP 116/58 | HR 75 | Temp 97.8°F | Ht 62.0 in | Wt 135.8 lb

## 2022-11-12 DIAGNOSIS — I050 Rheumatic mitral stenosis: Secondary | ICD-10-CM | POA: Insufficient documentation

## 2022-11-12 DIAGNOSIS — Z23 Encounter for immunization: Secondary | ICD-10-CM | POA: Diagnosis not present

## 2022-11-12 DIAGNOSIS — D72829 Elevated white blood cell count, unspecified: Secondary | ICD-10-CM | POA: Insufficient documentation

## 2022-11-12 DIAGNOSIS — R4189 Other symptoms and signs involving cognitive functions and awareness: Secondary | ICD-10-CM

## 2022-11-12 DIAGNOSIS — Z8673 Personal history of transient ischemic attack (TIA), and cerebral infarction without residual deficits: Secondary | ICD-10-CM | POA: Diagnosis not present

## 2022-11-12 LAB — CBC WITH DIFFERENTIAL/PLATELET
Basophils Absolute: 0 10*3/uL (ref 0.0–0.2)
Basos: 0 %
EOS (ABSOLUTE): 0.3 10*3/uL (ref 0.0–0.4)
Eos: 4 %
Hematocrit: 39.8 % (ref 34.0–46.6)
Hemoglobin: 12.9 g/dL (ref 11.1–15.9)
Immature Grans (Abs): 0 10*3/uL (ref 0.0–0.1)
Immature Granulocytes: 0 %
Lymphocytes Absolute: 2.9 10*3/uL (ref 0.7–3.1)
Lymphs: 35 %
MCH: 29.9 pg (ref 26.6–33.0)
MCHC: 32.4 g/dL (ref 31.5–35.7)
MCV: 92 fL (ref 79–97)
Monocytes Absolute: 0.9 10*3/uL (ref 0.1–0.9)
Monocytes: 11 %
Neutrophils Absolute: 4.1 10*3/uL (ref 1.4–7.0)
Neutrophils: 50 %
Platelets: 146 10*3/uL — ABNORMAL LOW (ref 150–450)
RBC: 4.31 x10E6/uL (ref 3.77–5.28)
RDW: 14 % (ref 11.7–15.4)
WBC: 8.3 10*3/uL (ref 3.4–10.8)

## 2022-11-12 NOTE — Assessment & Plan Note (Addendum)
Reports a history of CVA in 2012,  Which left mild residual right-sided weakness.  Does not use any walking aid at this time but does report falls. Does not wish to try physical therapy at this time.  PLAN: advised to try exercises at home. Be very careful with gait and activity

## 2022-11-12 NOTE — Assessment & Plan Note (Signed)
Could be secondary to history of CVA or age related cognitive decline and memory loss.,  PLAN: continue Aricept 10 mg and Namenda 10 mg.

## 2022-11-12 NOTE — Assessment & Plan Note (Addendum)
Was mentioned in her last cardiology visit from August 2023 that she had an echocardiogram in October 2018 which showed moderate mitral stenosis which did not change from previous echocardiograms. She might have had another echocardiogram in August 2022 which showed moderate to severe mitral stenosis and hypertrophic obstructive cardiomyopathy. At that time, she was offered a  transesophageal echocardiogram, to better assess the mitral valve but looks like she did not want to have it done at that time.  Now that she complains of generalized weakness and fatigue, I recommend them to keep appt with cardiology as scheduled for October 7th and discuss a TEE.  Continue current meds at this time.

## 2022-11-12 NOTE — Assessment & Plan Note (Signed)
Leukocytosis of unclear etiology on her last blood work. No clear signs of infection.  PLAN: repeat CBC with diff.

## 2022-11-12 NOTE — Assessment & Plan Note (Signed)
Flu shot in office today

## 2022-11-12 NOTE — Progress Notes (Signed)
New Patient Office Visit  Subjective    Patient ID: Frances Mendoza, female    DOB: [birth year]  Age: 87 y.o. MRN: [id number]  CC:  Chief Complaint  Patient presents with   New to establish    HPI Frances Mendoza presents to establish care, here with her daughter Selena Batten. Patient and daughter are primary historians.    she has diazepam listed in her medications. As per PMP, she has only received 2 refills this year in February 2024 and June 2024. Takes a 1/4 pill of diazepam at bedtime.    do not think zoloft is helping. Has been on it for many years.   history of stroke in 2012, still has mild right sided weakness. Follows up with [facility] neurology.   Wants to continue Aricept and Namenda as she feels they are helping.  last fall was last week. Able to get herself up. forgets to use a cane. Rather, does not prefer to use a cane.  had done PT few times. Does help.  Sees a cardiologist with [facility].       11/12/2022   11:24 AM  PHQ9 SCORE ONLY  PHQ-9 Total Score 16        11/12/2022   11:45 AM 11/12/2022   11:32 AM  GAD 7 : Generalized Anxiety Score  Nervous, Anxious, on Edge 3   Control/stop worrying 3   Worry too much - different things 1   Trouble relaxing 3   Restless 2   Easily annoyed or irritable 3 3  Afraid - awful might happen 3 3  Total GAD 7 Score 18   Anxiety Difficulty Very difficult        Outpatient Encounter Medications as of 11/12/2022  Medication Sig   acetaminophen (TYLENOL) 500 MG tablet Take 500 mg by mouth every 6 (six) hours as needed.   Ascorbic Acid (VITAMIN C PO) Take 50 mg by mouth daily.    aspirin 81 MG EC tablet Take 81 mg by mouth daily.   Cyanocobalamin (VITAMIN B12 PO) Take 1,000 mg by mouth daily.    diazepam (VALIUM) 5 MG tablet Take 5 mg by mouth every 8 (eight) hours as needed for anxiety. 3 daily   donepezil (ARICEPT) 10 MG tablet Take 1 tablet (10 mg total) by mouth daily.   memantine (NAMENDA) 10 MG tablet Take  1 tablet (10 mg total) by mouth 2 (two) times daily.   nitroGLYCERIN (NITROSTAT) 0.4 MG SL tablet Place 1 tablet (0.4 mg total) under the tongue every 5 (five) minutes x 3 doses as needed for chest pain.   sertraline (ZOLOFT) 100 MG tablet Take 1 tablet by mouth daily.   [DISCONTINUED] potassium chloride (K-DUR,KLOR-CON) 10 MEQ tablet TAKE 2 TABLETS BY MOUTH DAILY   [DISCONTINUED] azithromycin (ZITHROMAX) 500 MG tablet Take one tablet by mouth one hour prior to dental procedures. (Patient not taking: Reported on 10/03/2021)   [DISCONTINUED] fluticasone (FLONASE) 50 MCG/ACT nasal spray Place 1 spray into the nose 2 (two) times daily. (Patient not taking: Reported on 10/04/2022)   [DISCONTINUED] meloxicam (MOBIC) 15 MG tablet Take 15 mg by mouth daily.   [DISCONTINUED] Misc Natural Products (TART CHERRY ADVANCED PO) Take by mouth daily.   No facility-administered encounter medications on file as of 11/12/2022.    Past Medical History:  Diagnosis Date   Allergy    Anxiety    Arthritis    osteoarthritis   Back problem    CAD (coronary artery disease)  a. 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 upper doesn't fit    Past Surgical History:  Procedure Laterality Date   ABDOMINAL HYSTERECTOMY  1979   full   bladder tuck  2007?   BROW LIFT Bilateral 03/06/2016   Procedure: BLEPHAROPLASTY REPAIR, RESECT EX;  Surgeon: Imagene Riches, MD;  Location: [facility];  Service: Ophthalmology;  Laterality: Bilateral;   CHOLECYSTECTOMY  09/29/1999   LEFT  HEART CATHETERIZATION WITH CORONARY ANGIOGRAM N/A 05/23/2012   Procedure: LEFT HEART CATHETERIZATION WITH CORONARY ANGIOGRAM;  Surgeon: Rollene Rotunda, MD;  Location: [facility] C[facility];  Service: Cardiovascular;  Laterality: N/A;   TEE WITHOUT CARDIOVERSION  10/10/2010    done with suggestion of small mitral vegetation.  ID was consulted for input and ID feels  patient with possible endocarditis with septic emboli    Family History  Problem Relation Age of Onset   Coronary artery disease Unknown        family history of   Colon cancer Unknown        family history of   Cerebral palsy Sister    Colon cancer Brother    Prostate cancer Brother     Social History   Socioeconomic History   Marital status: Married    Spouse name: Dimas Aguas   Number of children: 3   Years of education: 12   Highest education level: Not on file  Occupational History    Comment: patient not employed   Occupation: Retired  Tobacco Use   Smoking status: Never   Smokeless tobacco: Never  Substance and Sexual Activity   Alcohol use: No   Drug use: No   Sexual activity: Not Currently  Other Topics Concern   Not on file  Social History Narrative   Patient lives at home with spouse. Howard   Caffeine Use: 2 cups daily   Patient has 3 children.    Patient is retired.          Social Determinants of Health   Financial Resource Strain: Low Risk  (01/23/2021)   Received from [facility]   Overall Financial Resource Strain (CARDIA)  Food Insecurity: No Food Insecurity (01/23/2021)   Received from [facility]   Hunger Vital Sign  Transportation Needs: No Transportation Needs (01/24/2022)   Received from [facility], [facility]   [facility] - Transportation    Lack of Transportation (Medical): No    Lack of Transportation (Non-Medical): No  Physical Activity: Inactive (01/23/2021)   Received from [facility], [facility]   Exercise Vital Sign    Days of Exercise per Week: 0 days     Minutes of Exercise per Session: 0 min  Stress: No Stress Concern Present (01/23/2021)   Received from [facility], [facility] - Occupational Stress Questionnaire    Feeling of Stress : Only a little  Social Connections: Moderately Integrated (01/23/2021)  Received from [facility]   Social Connection and Isolation Panel [NHANES]  Intimate Partner Violence: Not At Risk (01/23/2021)   Received from [facility]   Humiliation, Afraid, Rape, and Kick questionnaire    Review of Systems  Constitutional:  Negative for fever and malaise/fatigue.  HENT:  Negative for congestion, ear pain and sore throat.   Respiratory:  Negative for cough, shortness of breath and wheezing.   Cardiovascular:  Negative for chest pain and leg swelling.  Gastrointestinal:  Negative for abdominal pain, constipation and diarrhea.  Genitourinary:  Negative for frequency.  Musculoskeletal:  Negative for joint pain and myalgias.  Neurological:  Negative for dizziness, weakness and headaches.  Psychiatric/Behavioral:  Positive for depression. The patient is nervous/anxious.         Objective    BP (!) 116/58 (BP Location: Left Arm, Patient Position: Sitting)   Pulse 75   Temp 97.8 F (36.6 C) (Oral)   Ht 5\' 2"  (1.575 m)   Wt 135 lb 12.8 oz (61.6 kg)   SpO2 96%   BMI 24.84 kg/m   Physical Exam Constitutional:      Appearance: Normal appearance.  HENT:     Head: Normocephalic.  Cardiovascular:     Rate and Rhythm: Normal rate and regular rhythm.     Heart sounds: Murmur (loud possibly pansystolic murmur noted all over the chest wall. possibly 4/6 in intensity. no thrill noted) heard.  Pulmonary:     Effort: Pulmonary effort is normal.     Breath sounds: Normal breath sounds.  Musculoskeletal:        General: No swelling.  Skin:    General: Skin is warm.     Findings: Rash (several red ant bites noted on the feet) present.  Neurological:      Mental Status: She is alert and oriented to person, place, and time.     Motor: Weakness (mild 4/5 strength on right side) present.  Psychiatric:        Mood and Affect: Mood normal.        Behavior: Behavior normal.         Assessment & Plan:   Problem List Items Addressed This Visit     History of CVA (cerebrovascular accident)    Reports a history of CVA in 2012,  Which left mild residual right-sided weakness.  Does not use any walking aid at this time but does report falls. Does not wish to try physical therapy at this time.  PLAN: advised to try exercises at home. Be very careful with gait and activity      Cognitive impairment    Could be secondary to history of CVA or age related cognitive decline and memory loss.,  PLAN: continue Aricept 10 mg and Namenda 10 mg.       Leukocytosis - Primary    Leukocytosis of unclear etiology on her last blood work. No clear signs of infection.  PLAN: repeat CBC with diff.      Relevant Orders   CBC with Differential/Platelet   Encounter for immunization    Flu shot in office today      Relevant Orders   Flu Vaccine Trivalent High Dose (Fluad) (Completed)   Moderate mitral stenosis    Was mentioned in her last cardiology visit from August 2023 that she had an echocardiogram in October 2018 which showed moderate mitral stenosis which did not change from previous echocardiograms. She might have had another echocardiogram in August 2022 which showed  moderate to severe mitral stenosis and hypertrophic obstructive cardiomyopathy. At that time, she was offered a  transesophageal echocardiogram, to better assess the mitral valve but looks like she did not want to have it done at that time.  Now that she complains of generalized weakness and fatigue, I recommend them to keep appt with cardiology as scheduled for October 7th and discuss a TEE.  Continue current meds at this time.         Return in about 4 weeks (around  12/10/2022) for chronic disease follow up.   Total time spent on today's visit was greater than 30 minutes, including both face-to-face time and nonface-to-face time personally spent on review of chart (labs and imaging), discussing labs and goals, discussing further work-up, treatment options, referrals to specialist if needed, reviewing outside records of pertinent, answering patient's questions, and coordinating care.    I,Lauren M Auman,acting as a Neurosurgeon for [employer], MD.,have documented all relevant documentation on the behalf of Windell Moment, MD,as directed by  Windell Moment, MD while in the presence of Windell Moment, MD.   Windell Moment, MD

## 2022-11-12 NOTE — Patient Instructions (Signed)
Continue the same medicines Repeat cbc with diff Follow up with cardiology reg the mitral stenosis Flu shot today Return in 1 month

## 2022-11-13 ENCOUNTER — Other Ambulatory Visit: Payer: Self-pay

## 2022-11-13 MED ORDER — POTASSIUM CHLORIDE ER 10 MEQ PO CPCR: 10.0000 meq | ORAL_CAPSULE | Freq: Every day | ORAL | 1 refills | Status: DC

## 2022-11-25 ENCOUNTER — Encounter: Payer: Self-pay | Admitting: Cardiovascular Disease

## 2022-11-25 NOTE — Progress Notes (Unsigned)
[name] July Date of Birth  [birth year] [facility]     [place] Office  [address]    [address]   [address] [place], [place]  [address]    [place], [place]  [address] [phone]  Fax  [phone]  [phone]  Fax [phone]   Problem list 1. Mitral Valve Endocarditis - 2012 2.  Septic emboli leading to stroke   Previous notes:   Ms. Wenner is a 87 yo with a hx of bacterial endocarditis with subsequent septic emboli and CVA.  She was hospitalized in November for CP.  She ruled out for MI.  She complains of lots of pain in the back of both hips radiating down to her legs. She thinks may be related to one of her medications. He typically hurts worse when she stands up and walks around. It does not hurt at night when she is lying in bed.  The pain occurs whenever she stands or walks - not when she is lying down.  She denies any cardiac issues.  May 23, 2012  Ms. Killingsworth awoke with chest pain and left pain.  The pain lasted for 1-2 hours.  Her son checked her BP and HR which were normal.    The severe pain has resolved but her chest is still not quite normal.    She has had some pains in her chest in the past but this was more severe.  The pain resolved with a valium.  The pains were no pleuretic.  She had dyspnea associated with the pain.  No diaphoresis.  No syncope.  No palpitations.   No fever/ chills/ blood in urine or stool.   She gets some exercise - she has some dyspnea but no angina.    She has occasional episodes of palptations.  She feels like her heart is racing for 5 - 10 minutes.    She takes the valium 2-3 times a day.    She has chronic back pain and has been told that she needs to have surgery.  She does not want to have surgery.    May 07, 2013;  She is doing OK.  She is here alone today.  Has lots of anxiety. She has multiple general complaints.   Jan. 8, 2016:  Takina is seen with her daughter today.  She has had continued  shortness of breath . She had an echo at [facility] which is basically unchanged from our echo She feels well When she went to her medical doctor's office, she had not taken her HCTZ and her BP was elevated.  She was told that she should probably take a different BP med.   Today, she is doing great from a cardiac standpoint.    Aug. 29,. 2016:  She is doing well   Oct. 9, 2017  Tane is seen today for follow up of her endocarditis   February 25, 2017: Doing well Planning her garden for this coming spring  Watcher her salt intake   May 27, 2019 Getting some exercise  No CP or dyspnea  Takes Abx before dental work .    June 08, 2020:  Frances Mendoza is seen for follow up of her MV endocarditis.   Seen with daughter, [name]   She had a CVA which was thought to be a septic emboli from the endocarditis She also likely  had embolization down her LAD and RCA ( ? Or LCX)  She had an MI  at the same time.   Had a heart cath years ago which did not show any significant CAD .   She has normal left ventricular systolic function by echo in October, 2018. She has moderate mitral stenosis.  There is no changes from previous echocardiograms. Has had more shortness of breath over the past week Has been weeding her garden .  Put out pine straw [name] thinks she is able to do lot of work for someone age 73.    Aug. 19, 2022: Frances Mendoza is seen today for follow up of her MV endocarditis , septic emboli. Seen with Daughter, [name] . Seems to be getting better.  Echo showed mod-severe mitral stenosis Also HOCM We started metoprolol but she stopped it . Swe discussed TEE but she does not want to do right now .  Works in her yard.    October 03, 2021: That is seen today for follow-up of her mitral valve endocarditis, septic embolic stroke She is seen with daughter Selena Batten  Has some dyspnea,  is somewhat deconditioned  She has stopped her metoprolol   Oct. 7, 2024  Frances Mendoza is seen for follow up  of her MV encodarditis, septic stroke and septic MI Seen with daughter, Selena Batten  Is having some generalized fatigue     Current Outpatient Medications on File Prior to Visit  Medication Sig Dispense Refill   acetaminophen (TYLENOL) 500 MG tablet Take 500 mg by mouth every 6 (six) hours as needed.     Ascorbic Acid (VITAMIN C PO) Take 50 mg by mouth daily.      aspirin 81 MG EC tablet Take 81 mg by mouth daily.     Cyanocobalamin (VITAMIN B12 PO) Take 1,000 mg by mouth daily.      diazepam (VALIUM) 5 MG tablet Take 5 mg by mouth every 8 (eight) hours as needed for anxiety. 3 daily     donepezil (ARICEPT) 10 MG tablet Take 1 tablet (10 mg total) by mouth daily. 90 tablet 3   memantine (NAMENDA) 10 MG tablet Take 1 tablet (10 mg total) by mouth 2 (two) times daily. 180 tablet 3   nitroGLYCERIN (NITROSTAT) 0.4 MG SL tablet Place 1 tablet (0.4 mg total) under the tongue every 5 (five) minutes x 3 doses as needed for chest pain. 25 tablet 11   potassium chloride (MICRO-K) 10 MEQ CR capsule Take 1 capsule (10 mEq total) by mouth daily. 30 capsule 1   sertraline (ZOLOFT) 100 MG tablet Take 1 tablet by mouth daily.     [DISCONTINUED] potassium chloride (K-DUR,KLOR-CON) 10 MEQ tablet TAKE 2 TABLETS BY MOUTH DAILY 60 tablet 0   No current facility-administered medications on file prior to visit.    Allergies  Allergen Reactions   Amoxicillin Anaphylaxis   Penicillins Anaphylaxis   Tolectin [Tolmetin Sodium] Anaphylaxis    Took at same time as amoxicillin when she had anaphylactic reaction.   Tolmetin Anaphylaxis   Ciprofloxacin Rash    Entire body looked as if sunburned, then skin peeled off.   Statins Other (See Comments)    Leg pain   Ioxaglate Hives    Patient had severe hives after Myelogram.  No issues breathing.  Returned for  a nerve root injection, was given Benadryl, mentioned some minor itching to daughter on the way home but no further issues. Was given a Benadryl today for this  injection.    Other Other (See Comments)   Prolactin    Remeron [Mirtazapine] Other (See Comments)  confusion   Bactrim [Sulfamethoxazole-Trimethoprim] Rash    ? Early rash on palms   Codeine Nausea And Vomiting   Contrast Media [Iodinated Contrast Media] Hives    Patient had severe hives after Myelogram.  No issues breathing.  Returned for  a nerve root injection, was given Benadryl, mentioned some minor itching to daughter on the way home but no further issues. Was given a Benadryl today for this injection.    Imipenem Rash   Megestrol Rash   Procardia [Nifedipine] Other (See Comments)    Causes aches down legs   Vancomycin Rash    Past Medical History:  Diagnosis Date   Allergy    Anxiety    Arthritis    osteoarthritis   Back problem    CAD (coronary artery disease)    a. 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 upper doesn't fit    Past Surgical History:  Procedure Laterality Date   ABDOMINAL HYSTERECTOMY  1979   full   bladder tuck  2007?   BROW LIFT Bilateral 03/06/2016   Procedure: BLEPHAROPLASTY REPAIR, RESECT EX;  Surgeon: Imagene Riches, MD;  [facility]: [facility];  Service: Ophthalmology;  Laterality: Bilateral;   CHOLECYSTECTOMY  09/29/1999   LEFT HEART CATHETERIZATION WITH CORONARY ANGIOGRAM N/A 05/23/2012   Procedure: LEFT HEART CATHETERIZATION WITH CORONARY ANGIOGRAM;  Surgeon: Rollene Rotunda, MD;  Location: [facility] C[facility] LAB;  Service: Cardiovascular;  Laterality: N/A;   TEE WITHOUT CARDIOVERSION  10/10/2010    done  with suggestion of small mitral vegetation.  ID was consulted for input and ID feels  patient with possible endocarditis with septic emboli    Social History   Tobacco Use  Smoking Status Never  Smokeless Tobacco Never    Social History   Substance and Sexual Activity  Alcohol Use No    Family History  Problem Relation Age of Onset   Coronary artery disease Unknown        family history of   Colon cancer Unknown        family history of   Cerebral palsy Sister    Colon cancer Brother    Prostate cancer Brother     Reviw of Systems:  Reviewed in the HPI.  All other systems are negative.  Physical Exam: Blood pressure 122/80, pulse 73, height 5\' 2"  (1.575 m), weight 132 lb (59.9 kg), SpO2 96%.       GEN:  elderly female  in no acute distress HEENT: Normal NECK: No JVD; No carotid bruits LYMPHATICS: No lymphadenopathy CARDIAC: RRR 2-3/6 harsh systolic murmur at upper LSB and a 2nd blowing systolic at Left ax line associated with MR .  ? Very soft diastolic murmur  RESPIRATORY:  Clear to auscultation without rales, wheezing or rhonchi  ABDOMEN: Soft, non-tender, non-distended MUSCULOSKELETAL:  No edema; No deformity  SKIN: Warm and dry NEUROLOGIC:  Alert and oriented x 3    ECG:  EKG Interpretation Date/Time:  Monday November 26 2022 11:04:41 EDT Ventricular Rate:  73 PR Interval:  224 QRS Duration:  104 QT Interval:  438 QTC Calculation: 482 R Axis:   -27  Text Interpretation: Sinus rhythm with 1st degree A-V block Inferior infarct (cited on or before 23-May-2012) Anteroseptal infarct (cited on or before 24-May-2012) When compared with ECG of 12-Jul-2012 21:19, Questionable change in initial forces of Septal leads Confirmed by Kristeen Miss ([id number]) on 11/26/2022 11:22:27 AM     Assessment / Plan:   1. Endocarditis -  No evidence of recurrence      2.  Mitral stenosis / mitral regurgitation.   Echocardiogram from 2 years ago shows moderate mitral  regurgitation as well as moderate mitral stenosis.  She is not a good candidate for surgery and frankly she does not want to have any surgical procedures.  We briefly discussed doing a transesophageal echo but she is not in favor of doing this.  Will get a regular surface echo to assess her LV function and valvular function.  If her mitral valve has significantly worsened we can certainly discuss doing further evaluation but at this point she really is not inclined to do much more.  Her she is not really short of breath but does have profound fatigue.     2.  Septic emboli leading to stroke and embolic Inf and ant. STEMI   3.   TIA -     4. Essential HTN:    : Blood pressure is well-controlled.  5. Hyperlipidemia : Lipids have been stable.   6.  Question of  MI :    EKG suggest that she has had an inferior wall MI and a anterior wall MI.  I suspect this was due to septal emboli.  We have never done a heart catheterization  Echocardiogram reveals hyperdynamic LV function.  She has a dynamic LVOT obstruction.  We tried metoprolol but she did not tolerate it.  Kristeen Miss, MD  11/26/2022 11:22 AM    [facility] [address] [place],  [address] [place], [place]  [address] Pager [phone] Phone: [phone]; Fax: [phone]

## 2022-11-26 ENCOUNTER — Encounter: Payer: Self-pay | Admitting: Cardiovascular Disease

## 2022-11-26 ENCOUNTER — Ambulatory Visit: Payer: Medicare Other | Attending: Cardiovascular Disease | Admitting: Cardiovascular Disease

## 2022-11-26 VITALS — BP 122/80 | HR 73 | Ht 62.0 in | Wt 132.0 lb

## 2022-11-26 DIAGNOSIS — Z09 Encounter for follow-up examination after completed treatment for conditions other than malignant neoplasm: Secondary | ICD-10-CM

## 2022-11-26 DIAGNOSIS — I340 Nonrheumatic mitral (valve) insufficiency: Secondary | ICD-10-CM

## 2022-11-26 NOTE — Patient Instructions (Signed)
Medication Instructions:  Your physician recommends that you continue on your current medications as directed. Please refer to the Current Medication list given to you today.  *If you need a refill on your cardiac medications before your next appointment, please call your pharmacy*   Lab Work: none If you have labs (blood work) drawn today and your tests are completely normal, you will receive your results only by: MyChart Message (if you have MyChart) OR A paper copy in the mail If you have any lab test that is abnormal or we need to change your treatment, we will call you to review the results.   Testing/Procedures: Your physician has requested that you have an echocardiogram. Echocardiography is a painless test that uses sound waves to create images of your heart. It provides your doctor with information about the size and shape of your heart and how well your heart's chambers and valves are working. This procedure takes approximately one hour. There are no restrictions for this procedure. Please do NOT wear cologne, perfume, aftershave, or lotions (deodorant is allowed). Please arrive 15 minutes prior to your appointment time.    Follow-Up: At [facility], you and your health needs are our priority.  As part of our continuing mission to provide you with exceptional heart care, we have created designated Provider Care Teams.  These Care Teams include your primary Cardiologist (physician) and Advanced Practice Providers (APPs -  Physician Assistants and Nurse Practitioners) who all work together to provide you with the care you need, when you need it.  We recommend signing up for the patient portal called "MyChart".  Sign up information is provided on this After Visit Summary.  MyChart is used to connect with patients for Virtual Visits (Telemedicine).  Patients are able to view lab/test results, encounter notes, upcoming appointments, etc.  Non-urgent messages can be sent to your  provider as well.   To learn more about what you can do with MyChart, go to [email or web address].    Your next appointment:   6 month(s)  Provider:   Kristeen Miss, MD     Other Instructions

## 2022-12-07 ENCOUNTER — Other Ambulatory Visit: Payer: Self-pay | Admitting: Family Medicine

## 2022-12-12 ENCOUNTER — Ambulatory Visit (HOSPITAL_COMMUNITY): Payer: Medicare Other | Attending: Cardiovascular Disease

## 2022-12-12 DIAGNOSIS — I340 Nonrheumatic mitral (valve) insufficiency: Secondary | ICD-10-CM | POA: Diagnosis not present

## 2022-12-12 LAB — ECHOCARDIOGRAM COMPLETE
AR max vel: 2.09 cm2
AV Area VTI: 2.34 cm2
AV Area mean vel: 2.28 cm2
AV Mean grad: 12 mm[Hg]
AV Peak grad: 21.7 mm[Hg]
Ao pk vel: 2.33 m/s
Area-P 1/2: 3.89 cm2
Est EF: >75
MV VTI: 1.44 cm2
P 1/2 time: 674 ms
S' Lateral: 2 cm

## 2023-02-14 ENCOUNTER — Ambulatory Visit: Payer: Medicare Other

## 2023-02-26 ENCOUNTER — Ambulatory Visit: Payer: Medicare Other

## 2023-02-26 VITALS — BP 104/68 | HR 82 | Temp 97.5°F | Ht 62.0 in | Wt 133.6 lb

## 2023-02-26 DIAGNOSIS — M25552 Pain in left hip: Secondary | ICD-10-CM | POA: Diagnosis not present

## 2023-02-26 DIAGNOSIS — G8929 Other chronic pain: Secondary | ICD-10-CM | POA: Diagnosis not present

## 2023-02-26 DIAGNOSIS — M545 Low back pain, unspecified: Secondary | ICD-10-CM | POA: Diagnosis not present

## 2023-02-26 DIAGNOSIS — M25551 Pain in right hip: Secondary | ICD-10-CM

## 2023-02-26 NOTE — Progress Notes (Signed)
 Acute Office Visit  Subjective:    Patient ID: Frances Mendoza, female    DOB: Mar 20, 1935, 88 y.o.   MRN: [id number]    Discussed the use of AI scribe software for clinical note transcription with the patient, who gave verbal consent to proceed.      HPI: Patient is in today for Right leg/hip pain. she has fallen a lot in the past 2 years. She states she hasn't fallen in 2-3 months and states her right hip and leg pain. Patient states the pain is worse when walking.  The patient, with a history of stroke and resultant right-sided weakness, presents with worsening right hip pain. The pain is severe enough to prevent the patient from lying on the right side, necessitating sleep in a prone position with the right leg extended. The patient reports a sensation of 'something moving' in the hip during ambulation. The pain is managed with two BC powders daily.  The patient has a history of falls but reports no significant falls within the last year. The patient denies any joint replacements or surgeries on the hips or legs. A CT scan from 2015 revealed significant spinal arthritis, but no hip issues were noted. The patient underwent hip x-rays approximately two years ago, which reportedly showed no arthritis. Following the x-rays, the patient completed a course of physical therapy which initially improved symptoms, but the hip pain has since worsened.  The patient also reports a history of pneumonia and recent treatment for a 'wet cough' with antibiotics. The patient has been wearing compression stockings daily for an unspecified condition. The patient lives independently and is able to travel for physical therapy.  Past Medical History:  Diagnosis Date   Allergy    Anxiety    Arthritis    osteoarthritis   Back problem    CAD (coronary artery disease)    a. 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 upper doesn't fit    Past Surgical History:  Procedure Laterality Date   ABDOMINAL HYSTERECTOMY  1979   full   bladder tuck  2007?   BROW LIFT Bilateral 03/06/2016   Procedure: BLEPHAROPLASTY REPAIR, RESECT EX;  Surgeon: Greig CHRISTELLA Gay, MD;  Location: [facility];  Service: Ophthalmology;  Laterality: Bilateral;   CHOLECYSTECTOMY  09/29/1999   LEFT HEART CATHETERIZATION WITH CORONARY ANGIOGRAM N/A 05/23/2012   Procedure: LEFT HEART CATHETERIZATION WITH CORONARY ANGIOGRAM;  Surgeon: Lynwood Schilling, MD;  Location: [facility];  Service: Cardiovascular;  Laterality: N/A;   TEE WITHOUT CARDIOVERSION  10/10/2010    done with suggestion of small mitral vegetation.  ID was consulted for input and ID feels  patient with possible endocarditis with septic emboli    Family History  Problem Relation Age of Onset   Coronary artery disease Unknown        family history of   Colon cancer Unknown        family history of   Cerebral  palsy Sister    Colon cancer Brother    Prostate cancer Brother     Social History   Socioeconomic History   Marital status: Married    Spouse name: Kayla   Number of children: 3   Years of education: 12   Highest education level: Not on file  Occupational History    Comment: patient not employed   Occupation: Retired  Tobacco Use   Smoking status: Never   Smokeless tobacco: Never  Substance and Sexual Activity   Alcohol use: No   Drug use: No   Sexual activity: Not Currently  Other Topics Concern   Not on file  Social History Narrative   Patient lives at home with spouse. Howard   Caffeine Use:  2 cups daily   Patient has 3 children.    Patient is retired.          Social Drivers of Health   Financial Resource Strain: Low Risk  (01/23/2021)   Received from [facility]   Overall Financial Resource Strain (CARDIA)  Food Insecurity: No Food Insecurity (01/23/2021)   Received from [facility]   Hunger Vital Sign  Transportation Needs: No Transportation Needs (01/24/2022)   [facility], [facility]   [facility] - Transportation    Lack of Transportation (Medical): No    Lack of Transportation (Non-Medical): No  Physical Activity: Inactive (01/23/2021)   Received from [facility], [facility]   Exercise Vital Sign    Days of Exercise per Week: 0 days    Minutes of Exercise per Session: 0 min  Stress: No Stress Concern Present (01/23/2021)   Received from [facility], [facility] - Occupational Stress Questionnaire    Feeling of Stress : Only a little  Social Connections: Moderately Integrated (01/23/2021)   Received from [facility]   Social Connection and Isolation Panel [NHANES]  Intimate Partner Violence: Not At Risk (01/23/2021)   Received from [facility]   Humiliation, Afraid, Rape, and Kick questionnaire    Outpatient Medications Prior to Visit  Medication Sig Dispense Refill   acetaminophen  (TYLENOL ) 500 MG tablet Take 500 mg by mouth every 6 (six) hours as needed.     Ascorbic Acid  (VITAMIN C  PO) Take 50 mg by mouth daily.      aspirin  81 MG EC tablet Take 81 mg by mouth daily.     Cyanocobalamin (VITAMIN B12 PO) Take 1,000 mg by mouth daily.      diazepam  (VALIUM ) 5 MG tablet Take 5 mg by mouth every 8 (eight) hours as needed for anxiety. 3 daily     donepezil  (ARICEPT ) 10 MG tablet Take 1 tablet (10 mg total) by mouth daily. 90 tablet 3   memantine  (NAMENDA ) 10 MG tablet Take 1 tablet (10 mg total) by mouth 2 (two) times daily. 180 tablet 3   nitroGLYCERIN  (NITROSTAT ) 0.4  MG SL tablet Place 1 tablet (0.4 mg total) under the tongue every 5 (five) minutes x 3 doses as needed for chest pain. 25 tablet 11   potassium chloride  (MICRO-K ) 10 MEQ CR capsule TAKE 1 CAPSULE BY MOUTH EVERY DAY 90 capsule 1   sertraline  (ZOLOFT ) 100 MG tablet Take 1 tablet by mouth daily.     No facility-administered medications prior to visit.    Allergies  Allergen Reactions   Amoxicillin Anaphylaxis   Penicillins Anaphylaxis   Tolectin [Tolmetin Sodium] Anaphylaxis  Took at same time as amoxicillin when she had anaphylactic reaction.   Tolmetin Anaphylaxis   Ciprofloxacin Rash    Entire body looked as if sunburned, then skin peeled off.   Statins Other (See Comments)    Leg pain   Ioxaglate Hives    Patient had severe hives after Myelogram.  No issues breathing.  Returned for  a nerve root injection, was given Benadryl , mentioned some minor itching to daughter on the way home but no further issues. Was given a Benadryl  today for this injection.    Other Other (See Comments)   Prolactin    Remeron [Mirtazapine] Other (See Comments)    confusion   Bactrim [Sulfamethoxazole-Trimethoprim] Rash    ? Early rash on palms   Codeine Nausea And Vomiting   Contrast Media [Iodinated Contrast Media] Hives    Patient had severe hives after Myelogram.  No issues breathing.  Returned for  a nerve root injection, was given Benadryl , mentioned some minor itching to daughter on the way home but no further issues. Was given a Benadryl  today for this injection.    Imipenem Rash   Megestrol Rash   Procardia [Nifedipine] Other (See Comments)    Causes aches down legs   Vancomycin  Rash    Review of Systems  Constitutional:  Negative for appetite change, chills, fatigue and fever.  HENT:  Negative for congestion, ear discharge, ear pain, rhinorrhea, sinus pressure, sneezing and sore throat.   Eyes:  Negative for visual disturbance.  Respiratory:  Negative for cough, chest tightness, shortness  of breath and wheezing.   Cardiovascular:  Negative for chest pain, palpitations and leg swelling.  Gastrointestinal:  Negative for abdominal pain, diarrhea, nausea and vomiting.  Endocrine: Negative for polydipsia, polyphagia and polyuria.  Genitourinary:  Negative for difficulty urinating, dysuria, frequency, hematuria, menstrual problem, urgency, vaginal bleeding, vaginal discharge and vaginal pain.  Musculoskeletal:  Positive for arthralgias, back pain and gait problem. Negative for joint swelling, myalgias and neck pain.       Right leg pain  Neurological:  Negative for dizziness, seizures, syncope, weakness, numbness and headaches.  Psychiatric/Behavioral:  Negative for agitation, confusion, hallucinations, sleep disturbance and suicidal ideas. The patient is not nervous/anxious.        Objective:        02/26/2023    1:34 PM 11/26/2022   11:02 AM 11/12/2022   11:43 AM  Vitals with BMI  Height 5' 2 5' 2 5' 2  Weight 133 lbs 10 oz 132 lbs 135 lbs 13 oz  BMI 24.43 24.14 24.83  Systolic 104 122 883  Diastolic 68 80 58  Pulse 82 73 75    No data found.   Physical Exam Vitals and nursing note reviewed.  Constitutional:      Appearance: Normal appearance.  HENT:     Head: Normocephalic and atraumatic.  Cardiovascular:     Rate and Rhythm: Normal rate.     Heart sounds: Murmur (ejection systolic) heard.  Pulmonary:     Effort: Pulmonary effort is normal.     Breath sounds: Normal breath sounds.  Musculoskeletal:     Comments: Tenderness in the right groin.  Right low back tenderness  Skin:    General: Skin is warm.  Neurological:     General: No focal deficit present.     Mental Status: She is alert.  Psychiatric:        Mood and Affect: Mood normal.     Health Maintenance Due  Topic  Date Due   Medicare Annual Wellness (AWV)  Never done   Pneumonia Vaccine 58+ Years old (1 of 2 - PCV) Never done   DTaP/Tdap/Td (1 - Tdap) Never done   Zoster Vaccines-  Shingrix (1 of 2) Never done   DEXA SCAN  Never done    There are no preventive care reminders to display for this patient.   Lab Results  Component Value Date   TSH 1.952 10/07/2010   Lab Results  Component Value Date   WBC 8.3 11/12/2022   HGB 12.9 11/12/2022   HCT 39.8 11/12/2022   MCV 92 11/12/2022   PLT 146 (L) 11/12/2022   Lab Results  Component Value Date   NA 138 07/14/2012   K 3.4 (L) 07/14/2012   CO2 31 07/14/2012   GLUCOSE 108 (H) 07/14/2012   BUN 12 07/14/2012   CREATININE 0.70 07/14/2012   BILITOT 0.3 07/12/2012   ALKPHOS 74 07/12/2012   AST 19 07/12/2012   ALT 12 07/12/2012   PROT 7.6 07/12/2012   ALBUMIN 4.5 07/12/2012   CALCIUM 9.7 07/14/2012   GFR 86.67 05/28/2012   Lab Results  Component Value Date   CHOL 229 (H) 07/13/2012   Lab Results  Component Value Date   HDL 61 07/13/2012   Lab Results  Component Value Date   LDLCALC 130 (H) 07/13/2012   Lab Results  Component Value Date   TRIG 192 (H) 07/13/2012   Lab Results  Component Value Date   CHOLHDL 3.8 07/13/2012   Lab Results  Component Value Date   HGBA1C 5.3 07/14/2012       Assessment & Plan:  Chronic hip pain, bilateral Assessment & Plan: Chronic right hip pain, exacerbated by recent falls, possibly related to underlying arthritis or spinal issues. Pain interferes with sleep and daily activities.  I did not find any previous x-rays.    Physical exam reveals right gluteal and groin tenderness, but no significant SI joint or trochanteric bursa tenderness. Pain may be referred from the lumbar spine.   .  Discussed that x-rays can help determine arthritis severity, but unless severe, surgery or injections are unlikely. Emphasized benefits of physical therapy for pain management and functional improvement. - Order x-rays of the lumbar spine and hips - Refer to physical therapy at [facility] in [facility] - Recommend regular use of acetaminophen  for pain  management - Advise application of heat or ice as needed - Consider use of a back brace if pain is severe  Stroke with Residual Weakness Stroke with residual right-sided weakness, contributing to balance issues and falls. - Monitor for new neurological symptoms - Continue current management and support for stroke-related deficits  Recent Respiratory Infection Recent respiratory infection treated with azithromycin  for a wet cough and cloudy lower lobe on imaging. Follow-up indicates no pneumonia; lungs sound clear today. - Monitor for recurrence of respiratory symptoms - Encourage completion of the antibiotic course if not already done  General Health Maintenance Discussed importance of regular follow-ups and screenings. - Encourage regular check-ups with primary care provider - Advise checking the patient portal for updates and communication  Follow-up - Follow up with x-ray results - Physical therapy to contact for scheduling - Provide printed x-ray orders for completion at [facility].  Orders: -     DG HIPS BILAT W OR W/O PELVIS 3-4 VIEWS; Future -     Ambulatory referral to Physical Therapy  Chronic right-sided low back pain without sciatica -  DG Lumbar Spine Complete; Future -     Ambulatory referral to Physical Therapy     No orders of the defined types were placed in this encounter.   Orders Placed This Encounter  Procedures   DG Lumbar Spine Complete   DG HIPS BILAT WITH PELVIS 3-4 VIEWS   Ambulatory referral to Physical Therapy     Follow-up: Return in about 6 weeks (around 04/09/2023).  An After Visit Summary was printed and given to the patient.  Ahaana Rochette, MD [facility] [phone]

## 2023-02-26 NOTE — Assessment & Plan Note (Signed)
 Chronic right hip pain, exacerbated by recent falls, possibly related to underlying arthritis or spinal issues. Pain interferes with sleep and daily activities.  I did not find any previous x-rays.    Physical exam reveals right gluteal and groin tenderness, but no significant SI joint or trochanteric bursa tenderness. Pain may be referred from the lumbar spine.   .  Discussed that x-rays can help determine arthritis severity, but unless severe, surgery or injections are unlikely. Emphasized benefits of physical therapy for pain management and functional improvement. - Order x-rays of the lumbar spine and hips - Refer to physical therapy at [facility] in [facility] - Recommend regular use of acetaminophen  for pain management - Advise application of heat or ice as needed - Consider use of a back brace if pain is severe  Stroke with Residual Weakness Stroke with residual right-sided weakness, contributing to balance issues and falls. - Monitor for new neurological symptoms - Continue current management and support for stroke-related deficits  Recent Respiratory Infection Recent respiratory infection treated with azithromycin  for a wet cough and cloudy lower lobe on imaging. Follow-up indicates no pneumonia; lungs sound clear today. - Monitor for recurrence of respiratory symptoms - Encourage completion of the antibiotic course if not already done  General Health Maintenance Discussed importance of regular follow-ups and screenings. - Encourage regular check-ups with primary care provider - Advise checking the patient portal for updates and communication  Follow-up - Follow up with x-ray results - Physical therapy to contact for scheduling - Provide printed x-ray orders for completion at [name].

## 2023-02-26 NOTE — Patient Instructions (Signed)
 VISIT SUMMARY:  During today's visit, we discussed your worsening right hip pain, which has been affecting your sleep and daily activities. We also reviewed your history of stroke with right-sided weakness, recent respiratory infection, and general health maintenance.  YOUR PLAN:  -RIGHT HIP PAIN: Your chronic right hip pain may be related to underlying arthritis or spinal issues. We will order x-rays of your lumbar spine and hips to better understand the cause. Physical therapy at [facility] in [facility] is recommended to help manage your pain and improve function. You should use acetaminophen  regularly for pain, apply heat or ice as needed, and consider a back brace if the pain is severe.  -STROKE WITH RESIDUAL WEAKNESS: Your previous stroke has left you with right-sided weakness, which affects your balance and increases the risk of falls. We will continue to monitor for any new neurological symptoms and maintain your current management plan.  -RECENT RESPIRATORY INFECTION: You recently had a respiratory infection treated with antibiotics. Your lungs sound clear today, but we will monitor for any recurrence of symptoms. Please ensure you complete the antibiotic course if you haven't already.  -GENERAL HEALTH MAINTENANCE: We discussed the importance of regular follow-ups and screenings. Please continue to have regular check-ups with your primary care provider and check the patient portal for updates and communication.  INSTRUCTIONS:  Please follow up with the x-ray results once they are available. [facility] in [facility] will contact you to schedule your sessions. You have been provided with printed x-ray orders for completion at [facility].

## 2023-10-08 ENCOUNTER — Encounter: Payer: Self-pay | Admitting: Neurology

## 2023-10-08 ENCOUNTER — Ambulatory Visit (INDEPENDENT_AMBULATORY_CARE_PROVIDER_SITE_OTHER): Payer: Medicare Other | Admitting: Neurology

## 2023-10-08 VITALS — BP 127/70 | HR 77 | Resp 15 | Ht 62.0 in | Wt 127.4 lb

## 2023-10-08 DIAGNOSIS — F419 Anxiety disorder, unspecified: Secondary | ICD-10-CM

## 2023-10-08 DIAGNOSIS — D509 Iron deficiency anemia, unspecified: Secondary | ICD-10-CM

## 2023-10-08 DIAGNOSIS — Z8673 Personal history of transient ischemic attack (TIA), and cerebral infarction without residual deficits: Secondary | ICD-10-CM | POA: Diagnosis not present

## 2023-10-08 DIAGNOSIS — R4189 Other symptoms and signs involving cognitive functions and awareness: Secondary | ICD-10-CM

## 2023-10-08 DIAGNOSIS — F32A Depression, unspecified: Secondary | ICD-10-CM

## 2023-10-08 NOTE — Progress Notes (Unsigned)
 [facility]  ASSESSMENT AND PLAN  88 y.o. year old female  Mild cognitive impairment  Overall stable, Mini-Mental Status Examination is 29/30  Refilled her prescription of Aricept  10 mg daily, Namenda  10 mg twice a day.   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   No acute infarct or intracranial hemorrhage.   Remote left temporal lobe infarct. Remote bilateral centrum semiovale/corona radiata small infarcts. Remote small left thalamic infarct.   Progressive prominent white matter changes most consistent with result of chronic microvascular changes.   Transverse ligament hypertrophy. Cervical spondylotic changes with spinal stenosis and various degrees of cord flattening C3-4 thru C5-6.   Maxillary sinus mucosal thickening greater on left with there is a small air-fluid level raising possibility of acute sinusitis.  HISTORY OF PRESENT ILLNESS: Frances Mendoza is a 88 years old right-handed female, accompanied by her daughter Frances Mendoza for evaluation of short-term memory trouble, last visit was September 2015    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, presented with confusion, delirium, gait difficulty, TEE showed ejection fraction 65%, calcified mitral valve, no vegetation, was diagnosed with a septic endocarditis, was treated with prolonged antibiotics, antiplatelet agent, also with non-ST elevation MI,  she also had past medical history of hypertension, anxiety, hyperlipidemia, she had a high school graduate, lives at home with her husband,   Ever since that event she has become much less active, only driving occasionally at short distance, also has short-term memory trouble, occasionally confusion, no longer cooking, she also complains of chronic low back pain, long-standing history of depression anxiety, she is taking Valium  5 mg 3 times a day, her husband has taken over her prescription medication management, but she tends to take a lot of supplements, as needed Tylenol , ibuprofen ,   On May 24th 2014, she woke up was noticed by her family to have confusion, unsteady gait, lasting for 24 hours, no seizure activity was noted, she was admitted to the hospital, May 24th through 27th, 2014.   MRI head again showed acute infarction: Two or three punctate acute infarctions in the left frontal region affecting the caudate head and left frontal lobe.  No swelling, hemorrhage or mass effect.  Moderate chronic small vessel changes elsewhere throughout the brain.     MRA HEAD: No major vessel occlusion or correctable proximal stenosis. Repeat echocardiogram showed ejection fraction 65%, calcified mitral valve, no vegetations, EEG was normal, she is not back to her normal self, very argumentary, especially to her daughter. Ultrasound of carotid artery showed no large vessel disease   Mother died age 57 from stroke, father died age 65 from heart attack, no family history of dementia   UPDATE Sept 28th 2015: Her husband is helping with her medications, she is alone at visit, she is overall doing well, thinking she is improving. She works in her garden a lot, still active at home, cooking Sunday lunch for her extended family.  She has no trouble walking, she fell  sometimes when turning quickly   UPDATE Mar 21 2015: She is with her daughter at visit, she had bad cold in Dec 2016, take few weeks to recover, she  complains of gradual worsening memory loss, but she still very active, she still drives, no gait difficulties, she has chronic insomnia, taking Valium  5 mg half to one tablets every night as needed  Update February 17, 2019: She has lost followed up by couple years, overall stable, still lives alone, driving, taking care of her household without much difficulty, today's Mini-Mental Status Examination 29 out of 30, she is taking Namenda  10 mg twice a day, and Aricept  10 mg daily  UPDATE Jun 28 2020: She live by herself, still drive short distance, her husband passed away in 12-03-2019, she still graves about it, she has no appetite, just had all teeth pulled out,   She is with her son [name] at today's visit, stated that she has worsening memory loss, mild depression,but still busy all day long, she still cook few times for everybody after Sunday worship  She started to sleep better, taking valium  prn, she had been using variable dose of Valium  for more than few decades, She is also taking zoloft  100mg  qam since her stroke in 2012  UPDATE August 15th 2023: She is with her daughter Frances Mendoza at today's visit, she is overall doing well still lives alone, very active in her garden, she fell in few months ago, hurt her back, still complains of left knee pain is under the care of local orthopedic surgeon, denies significant gait abnormality other than the new medication from her pain, denies bowel and bladder incontinence  She was treated with Zoloft  up to 100 mg for her depression, which she reported significant improvement but stopped recently due to her complaints of increased confusion while taking Zoloft , since stopped antidepression, she noticed difficulty sleeping,  She is taking Namenda  10 mg twice a day, Aricept  10 mg daily, reported difference stopped taking medications, and improvement with the medicine  UPDATE October 08 2023: She lives by herself, 3 children, still drive, short short distance,  she drives around [place], grocery shopping, doing ok sleeping, decreased appetite, bathroom, constitpated, hermorroid, passing blood,   Mmeory short term,  use cane, zoloft ,  She does not want to watch TV<  Home health PT,   [facility] to look at work out sewing machine, exercise class, chairt cexercie, listen to class, wsa ok  Would not go to church, could nto hear, not want hearing aid,     PHYSICAL EXAM  Vitals:   06/28/20 1122  Weight: 140 lb (63.5 kg)  Height: 5' 2 (1.575 m)   Body mass index is 25.61 kg/m.  Generalized: Well developed, in no acute distress  Neurological examination    10/08/2023   11:26 AM 10/04/2022   10:43 AM 10/03/2021    8:45 AM 06/28/2020   11:35 AM  Montreal Cognitive Assessment   Visuospatial/ Executive (0/5) 5 5 4 4   Naming (0/3) 3 2 3 2   Attention: Read list of digits (0/2) 2 2 2 2   Attention: Read list of letters (0/1) 1 1 1 1   Attention: Serial 7 subtraction starting at 100 (0/3) 2 3 3 3   Language: Repeat phrase (0/2) 2 1 2 2   Language : Fluency (0/1) 0 0 0 0  Abstraction (0/2) 2 2 2 2   Delayed Recall (0/5) 1 0 1 0  Orientation (0/6) 6 6 6 6   Total 24 22 24  22  Adjusted Score (based on education)    22     PHYSICAL EXAMNIATION:  Gen: NAD, conversant, well nourised, well groomed                     Cardiovascular: Regular rate rhythm, no peripheral edema, warm, nontender. Eyes: Conjunctivae clear without exudates or hemorrhage Neck: Supple, no carotid bruits. Pulmonary: Clear to auscultation bilaterally   NEUROLOGICAL EXAM:  MENTAL STATUS: Speech/cognition: Awake, alert oriented to history taking and casual conversation  CRANIAL NERVES: CN II: Visual fields are full to confrontation.  Pupils are round equal and briskly reactive to light. CN III, IV, VI: extraocular movement are normal. No ptosis. CN V: Facial sensation is intact to pinprick in all 3 divisions bilaterally. Corneal responses are intact.  CN VII: Face is  symmetric with normal eye closure and smile. CN VIII: Hearing is normal to casual conversation CN IX, X: Palate elevates symmetrically. Phonation is normal. CN XI: Head turning and shoulder shrug are intact CN XII: Tongue is midline with normal movements and no atrophy.  MOTOR: Mild deformity of hand joints, no upper or lower extremity weakness,  REFLEXES: Hypoactive and symmetric.  SENSORY: Intact to light touch,  COORDINATION: No truncal ataxia, or limb dysmetria,  GAIT/STANCE: Push-up to get up from seated position, cautious, mildly antalgic   REVIEW OF SYSTEMS: Full 14 system review of systems performed and notable only for those listed, all others are neg:  As above  ALLERGIES: Allergies  Allergen Reactions   Amoxicillin Anaphylaxis   Penicillins Anaphylaxis   Tolectin [Tolmetin Sodium] Anaphylaxis    Took at same time as amoxicillin when she had anaphylactic reaction.   Tolmetin Anaphylaxis   Ciprofloxacin Rash    Entire body looked as if sunburned, then skin peeled off.   Statins Other (See Comments)    Leg pain   Ioxaglate Hives    Patient had severe hives after Myelogram.  No issues breathing.  Returned for  a nerve root injection, was given Benadryl , mentioned some minor itching to daughter on the way home but no further issues. Was given a Benadryl  today for this injection.    Other Other (See Comments)   Prolactin    Remeron [Mirtazapine] Other (See Comments)    confusion   Bactrim [Sulfamethoxazole-Trimethoprim] Rash    ? Early rash on palms   Codeine Nausea And Vomiting   Contrast Media [Iodinated Contrast Media] Hives    Patient had severe hives after Myelogram.  No issues breathing.  Returned for  a nerve root injection, was given Benadryl , mentioned some minor itching to daughter on the way home but no further issues. Was given a Benadryl  today for this injection.    Imipenem Rash   Megestrol Rash   Procardia [Nifedipine] Other (See Comments)    Causes  aches down legs   Vancomycin  Rash    HOME MEDICATIONS: Outpatient Medications Prior to Visit  Medication Sig Dispense Refill   acetaminophen  (TYLENOL ) 500 MG tablet Take 500 mg by mouth every 6 (six) hours as needed.     Ascorbic Acid  (VITAMIN C  PO) Take 50 mg by mouth daily.      Aspirin -Acetaminophen  (GOODYS BACK & BODY PAIN PO) Take 1 Package by mouth 3 (three) times daily as needed.     diazepam  (VALIUM ) 5 MG tablet Take 5 mg by mouth every 8 (eight) hours as needed for anxiety. 3 daily     donepezil  (ARICEPT ) 10 MG tablet Take 1  tablet (10 mg total) by mouth daily. 90 tablet 3   meclizine  (ANTIVERT ) 25 MG tablet Take 25 mg by mouth 3 (three) times daily as needed.     memantine  (NAMENDA ) 10 MG tablet Take 1 tablet (10 mg total) by mouth 2 (two) times daily. 180 tablet 3   nitroGLYCERIN  (NITROSTAT ) 0.4 MG SL tablet Place 1 tablet (0.4 mg total) under the tongue every 5 (five) minutes x 3 doses as needed for chest pain. 25 tablet 11   potassium chloride  (MICRO-K ) 10 MEQ CR capsule TAKE 1 CAPSULE BY MOUTH EVERY DAY 90 capsule 1   sertraline  (ZOLOFT ) 100 MG tablet Take 1 tablet by mouth daily.     aspirin  81 MG EC tablet Take 81 mg by mouth daily.     Cyanocobalamin (VITAMIN B12 PO) Take 1,000 mg by mouth daily.      No facility-administered medications prior to visit.    PAST MEDICAL HISTORY: Past Medical History:  Diagnosis Date   Allergy    Anxiety    Arthritis    osteoarthritis   Back problem    CAD (coronary artery disease)    a. admx with CP c/w [place]  4/14 => LHC: pLAD 40%, RCA with lum irregs => med Rx   Cerebrovascular accident, embolic (HCC) 10/18/2010   Bilateral   Depression    Diverticulosis    Endocarditis 10/18/2010   treated with vancomycin , gentamicin , and Cipro   Hemorrhoids, internal    HLD (hyperlipidemia)    Hypertension    Hypokalemia    Insomnia    Renal insufficiency 11/29/2010   Septic embolism (HCC) 10/18/2010   endocarditis -- per TEE   Vertigo     Wears dentures    partial lower. upper doesn't fit    PAST SURGICAL HISTORY: Past Surgical History:  Procedure Laterality Date   ABDOMINAL HYSTERECTOMY  1979   full   bladder tuck  2007?   BROW LIFT Bilateral 03/06/2016   Procedure: BLEPHAROPLASTY REPAIR, RESECT EX;  Surgeon: Greig CHRISTELLA Gay, MD;  Location: [facility];  Service: Ophthalmology;  Laterality: Bilateral;   CHOLECYSTECTOMY  09/29/1999   LEFT HEART CATHETERIZATION WITH CORONARY ANGIOGRAM N/A 05/23/2012   Procedure: LEFT HEART CATHETERIZATION WITH CORONARY ANGIOGRAM;  Surgeon: Lynwood Schilling, MD;  Location: [facility] C[facility];  Service: Cardiovascular;  Laterality: N/A;   TEE WITHOUT CARDIOVERSION  10/10/2010    done with suggestion of small mitral vegetation.  ID was consulted for input and ID feels  patient with possible endocarditis with septic emboli    FAMILY HISTORY: Family History  Problem Relation Age of Onset   Coronary artery disease Unknown        family history of   Colon cancer Unknown        family history of   Cerebral palsy Sister    Colon cancer Brother    Prostate cancer Brother     SOCIAL HISTORY: Social History   Socioeconomic History   Marital status: Married    Spouse name: Kayla   Number of children: 3   Years of education: 12   Highest education level: Not on file  Occupational History    Comment: patient not employed   Occupation: Retired  Tobacco Use   Smoking status: Never   Smokeless tobacco: Never  Substance and Sexual Activity   Alcohol use: No   Drug use: No   Sexual activity: Not Currently  Other Topics Concern   Not on file  Social History Narrative  Patient lives at home with spouse. Frances Mendoza   Caffeine Use: 2 cups daily   Patient has 3 children.    Patient is retired.          Social Drivers of Corporate investment banker Strain: Low Risk  (01/23/2021)   [facility]   Overall Financial Resource Strain (CARDIA)  Food Insecurity: No Food  Insecurity (09/03/2023)   Received from [facility]   Hunger Vital Sign    Within the past 12 months, you worried that your food would run out before you got the money to buy more.: Never true    Within the past 12 months, the food you bought just didn't last and you didn't have money to get more.: Never true  Transportation Needs: No Transportation Needs (09/03/2023)   Received from [facility]   PRAPARE - Transportation    Lack of Transportation (Medical): No    Lack of Transportation (Non-Medical): No  Physical Activity: Inactive (01/23/2021)   Received from [facility]   Exercise Vital Sign    On average, how many days per week do you engage in moderate to strenuous exercise (like a brisk walk)?: 0 days    On average, how many minutes do you engage in exercise at this level?: 0 min  Stress: No Stress Concern Present (01/23/2021)   Received from [facility] Occupational Health - Occupational Stress Questionnaire    Feeling of Stress : Only a little  Social Connections: Moderately Integrated (01/23/2021)   Received from [facility]   Social Connection and Isolation Panel    In a typical week, how many times do you talk on the phone with family, friends, or neighbors?: More than three times a week    How often do you get together with friends or relatives?: More than three times a week    How often do you attend church or religious services?: 1 to 4 times per year    Do you belong to any clubs or organizations such as church groups, unions, fraternal or athletic groups, or school groups?: Yes    How often do you attend meetings of the clubs or organizations you belong to?: 1 to 4 times per year    Are you married, widowed, divorced, separated, never married, or living with a partner?: Widowed  Intimate Partner Violence: Not At Risk (09/03/2023)   Received from [facility]   Humiliation, Afraid, Rape, and Kick questionnaire    Within the last  year, have you been afraid of your partner or ex-partner?: No    Within the last year, have you been humiliated or emotionally abused in other ways by your partner or ex-partner?: No    Within the last year, have you been kicked, hit, slapped, or otherwise physically hurt by your partner or ex-partner?: No    Within the last year, have you been raped or forced to have any kind of sexual activity by your partner or ex-partner?: No    Modena Callander, M.D. Ph.D.  [facility] [address] [place], [place] [address] Phone: [phone] Fax:      [phone]

## 2023-10-10 ENCOUNTER — Telehealth: Payer: Self-pay | Admitting: Neurology

## 2023-10-10 NOTE — Telephone Encounter (Signed)
 Please call patient, laboratory evaluation showed:  --- Worsening anemia, hemoglobin of 9.6, drop from previous baseline of 12.9  --- Significantly decreased ferritin 6,  I have forwarded the above result to her primary care Sirivol, Mamatha, MD she may contact her for evaluation of iron deficiency anemia  Alzheimer's specific laboratory evaluation is still pending

## 2023-10-11 ENCOUNTER — Ambulatory Visit: Payer: Self-pay

## 2023-10-14 ENCOUNTER — Encounter: Payer: Self-pay | Admitting: Neurology

## 2023-10-14 NOTE — Telephone Encounter (Signed)
 Pt daughter ms. Threatt called and stated that she had more lab work with differing results and they wanted to make sure Dr. Onita was aware of new results depicted below and to see if the Alzheimer results are back yet

## 2023-10-14 NOTE — Telephone Encounter (Signed)
 Lvm 1st attempt by hf 10/14/23

## 2023-10-14 NOTE — Telephone Encounter (Signed)
 Pt's daughter(on DPR) would like to discuss results with RN so she can hear for herself the meaning.

## 2023-10-14 NOTE — Telephone Encounter (Signed)
 Called and informed pt of results. Pt voiced understanding that iron is decreased and that I would forward to pcp.

## 2023-10-24 LAB — CBC WITH DIFFERENTIAL/PLATELET
Basophils Absolute: 0 x10E3/uL (ref 0.0–0.2)
Basos: 1 %
EOS (ABSOLUTE): 0.4 x10E3/uL (ref 0.0–0.4)
Eos: 5 %
Hematocrit: 32.6 % — ABNORMAL LOW (ref 34.0–46.6)
Hemoglobin: 9.6 g/dL — ABNORMAL LOW (ref 11.1–15.9)
Immature Grans (Abs): 0 x10E3/uL (ref 0.0–0.1)
Immature Granulocytes: 0 %
Lymphocytes Absolute: 2.5 x10E3/uL (ref 0.7–3.1)
Lymphs: 32 %
MCH: 25.9 pg — ABNORMAL LOW (ref 26.6–33.0)
MCHC: 29.4 g/dL — ABNORMAL LOW (ref 31.5–35.7)
MCV: 88 fL (ref 79–97)
Monocytes Absolute: 0.7 x10E3/uL (ref 0.1–0.9)
Monocytes: 9 %
Neutrophils Absolute: 4.1 x10E3/uL (ref 1.4–7.0)
Neutrophils: 53 %
Platelets: 222 x10E3/uL (ref 150–450)
RBC: 3.7 x10E6/uL — ABNORMAL LOW (ref 3.77–5.28)
RDW: 14.7 % (ref 11.7–15.4)
WBC: 7.7 x10E3/uL (ref 3.4–10.8)

## 2023-10-24 LAB — ATN PROFILE
A -- Beta-amyloid 42/40 Ratio: 0.096 — ABNORMAL LOW (ref >0.102)
Beta-amyloid 40: 218.93 pg/mL
Beta-amyloid 42: 20.93 pg/mL
N -- NfL, Plasma: 3.42 pg/mL (ref 0.00–9.13)
T -- p-tau181: 1.61 pg/mL — ABNORMAL HIGH (ref 0.00–0.97)

## 2023-10-24 LAB — APOE ALZHEIMER'S RISK

## 2023-10-24 LAB — IRON,TIBC AND FERRITIN PANEL
Ferritin: 38 ng/mL (ref 15–150)
Iron Saturation: 6 % — CL (ref 15–55)
Iron: 27 ug/dL (ref 27–139)
Total Iron Binding Capacity: 478 ug/dL — ABNORMAL HIGH (ref 250–450)
UIBC: 451 ug/dL — ABNORMAL HIGH (ref 118–369)

## 2023-10-24 LAB — FOLATE: Folate: >20 ng/mL (ref >3.0)

## 2023-10-24 LAB — VITAMIN B12: Vitamin B-12: 782 pg/mL (ref 232–1245)

## 2023-11-05 NOTE — Progress Notes (Signed)
 Assessment/Plan   1. Weakness (Primary) Increased weakness over the past month most likely due to anemia secondary to recent bleeding.  If she feels the bleeding is from her hemorrhoids but it could be from another source.  Stools have been black since starting iron.  She was recently started on and pantoprazole and denies any abdominal pain.  Hematocrit stable over the past month at 9.6.  2. Iron deficiency anemia due to chronic blood loss Iron deficiency anemia with hemoglobin decreased from 13 to 9.5 since  May, indicating significant blood loss. Recent bleeding likely contributing to anemia. Iron supplementation initiated, but ongoing bleeding may hinder improvement.  - Continue oral iron supplementation. - Monitor hemoglobin levels weekly. - Discuss potential for iron infusion if oral supplementation is insufficient. - Referral to gastroenterologist for further evaluation of gastrointestinal bleeding.  She wishes to defer any procedures at present but encouraged to get a GI consultation in case bleeding persists.  She prefers to go to [place] where she had previous endoscopy around 10 years ago. - Consider sigmoidoscopy to evaluate hemorrhoids and rule out other sources of bleeding. - pantoprazole (PROTONIX) 40 MG tablet; Take 1 tablet (40 mg total) by mouth daily.  Dispense: 30 tablet; Refill: 1 - Gastroenterology; Future  3. Rectal bleeding Rectal bleeding likely due to hemorrhoids, but differential includes other sources of gastrointestinal bleeding. Bright red blood and history of hemorrhoids. She declines colonoscopy but is open to sigmoidoscopy. - Consider sigmoidoscopy to evaluate hemorrhoids and rule out other sources of bleeding. - CBC; Standing - Gastroenterology; Future    Patient response, barriers, and adherence to medication have been identified and addressed. Barriers to the treatment plan have been identified and addressed with patient. Unhealthy behaviors have been  noted and addressed as appropriate. Patient voiced understanding and all questions have been answered to satisfaction. F/U 1 month.  Subjective:  ERE:Ynqqfjw, Cheryal Heinz, MD  [name], is a 88 y.o. female  HPI: History of Present Illness Frances Mendoza is an 88 year old female with iron deficiency anemia who presents with weakness and recent bleeding. She is accompanied by her daughter [name].  She has been experiencing increasing weakness over the past month, feeling the weakest she has ever been. Fatigue is significant after walking short distances, such as to her mailbox, approximately fifty feet from her home. Hemoglobin levels have decreased from 13 to 9.5. She has been taking iron supplements daily for the past 2 weeks but is concerned about black stools, and her daughter recently arranged for blood work to assess the effectiveness of the supplementation.  She reports bleeding, which she attributes to hemorrhoids. A significant episode of bleeding resembled a 'bad period' with bright red blood visible in the toilet and trash can. Stools have been black, which she associates with iron supplementation but acknowledges could also be due to bleeding. The bleeding has been ongoing for about two months. She has a history of hemorrhoids and has experienced similar symptoms in the past. She has not had a colonoscopy in a long time but recalls having polyps removed during previous procedures. She has been using over-the-counter suppositories for her hemorrhoids.  She reports issues with urination, describing it as dribbling and not as forceful as it used to be. She associates this change with a medication, Namenda , which she was taking for memory, which she has since stopped. She has a history of using a catheter due to urinary retention in the past.  She has been experiencing dry skin and  itching, for which she has been using vitamin E cream, which she feels has improved her symptoms. She  also reports a decrease in her sense of smell and taste, which she attributes to a possible COVID-19 infection in 2022, although she tested negative at the time.  Reports weakness, shortness of breath with activity, and leg pain.   Medications Ordered Prior to Encounter[1]  Problem List[2]   Objective:   Physical Exam BP 120/60 (BP Site: L Arm, BP Position: Sitting, BP Cuff Size: Large)   Pulse 78   Resp 18   Wt 58.1 kg (128 lb)   SpO2 91%   BMI 23.55 kg/m  General-  Normal appearing female in no apparent distress. Neurologic- Alert and oriented X3.  Cranial nerve II-XII grossly intact. No focal abnormality. HEENT-  Normocephalic atraumatic head.  No scleral icterus.  Normal dentition and oral pharynx. Neck- Supple, no carotid bruits, JVD normal. Lungs- clear to auscultation, no wheezes, rhonchi, or rales. Heart-  heart rhythm regular. Murmur not present. Abdomen- Soft, nontender, no organomegaly. Extremities-  No deformity, No edema, pulses intact  Psych- Normal mood, appropriate.       [1] Current Outpatient Medications on File Prior to Visit  Medication Sig Dispense Refill  . ascorbic acid , vitamin C , (VITAMIN C ) 500 MG tablet Take by mouth.    SABRA azelastine (ASTELIN) 137 mcg (0.1 %) nasal spray 1-2 SPRAYS INTO EACH NOSTRIL TWO (2) TIMES A DAY. USE IN EACH NOSTRIL AS DIRECTED 30 mL 1  . diazePAM  (VALIUM ) 2 MG tablet Take 1 tablet (2 mg total) by mouth nightly as needed for anxiety. 30 tablet 0  . donepeziL  (ARICEPT ) 10 MG tablet Take 1 tablet (10 mg total) by mouth daily. 90 tablet 3  . fluticasone  propionate (FLONASE ) 50 mcg/actuation nasal spray 2 sprays into each nostril daily. 16 g 5  . ketoconazole (NIZORAL) 2 % shampoo Apply topically Two (2) times a week. 120 mL 2  . meclizine  (ANTIVERT ) 25 mg tablet Take 1 tablet (25 mg total) by mouth Three (3) times a day as needed. 30 tablet 1  . memantine  (NAMENDA ) 10 MG tablet Take 1 tablet (10 mg total) by mouth two (2)  times a day.    . NITROSTAT  0.4 mg SL tablet     . sertraline  (ZOLOFT ) 100 MG tablet TAKE 1 TABLET BY MOUTH EVERY DAY 90 tablet 3  . triamcinolone (KENALOG) 0.1 % cream Apply topically two (2) times a day. 30 g 0  . aspirin /salicylamide/caffeine (BC HEADACHE POWDER ORAL) Take by mouth. (Patient not taking: Reported on 11/05/2023)     No current facility-administered medications on file prior to visit.  [2] Patient Active Problem List Diagnosis  . B-complex deficiency  . Generalized anxiety disorder with panic attacks  . Esophageal reflux  . Hyperlipidemia  . Essential hypertension  . Low back pain  . Thoracic aortic ectasia  . Mild episode of recurrent major depressive disorder  . MCI (mild cognitive impairment) with memory loss  . Ischemic heart disease due to coronary artery obstruction    (CMS-HCC)  . 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 Mitral valve insufficiency  . LVH (left ventricular hypertrophy)  . Vertigo  . Tinnitus  . Multiple lacunar infarcts    (CMS-HCC)  . Seasonal allergic rhinitis  . Controlled substance agreement signed  . Flexural eczema  . Fatigue  . Moderate mitral valve stenosis  . Vitamin D deficiency  .  Mild aortic stenosis by prior echocardiogram  . Statin intolerance  . DDD (degenerative disc disease), lumbar

## 2023-11-12 ENCOUNTER — Ambulatory Visit (INDEPENDENT_AMBULATORY_CARE_PROVIDER_SITE_OTHER): Admitting: Student in an Organized Health Care Education/Training Program

## 2023-11-12 ENCOUNTER — Ambulatory Visit
Admission: RE | Admit: 2023-11-12 | Discharge: 2023-11-12 | Disposition: A | Discharge status: Home / Self Care | Source: Ambulatory Visit | Attending: Student in an Organized Health Care Education/Training Program | Admitting: Student in an Organized Health Care Education/Training Program

## 2023-11-12 VITALS — BP 100/46 | HR 77 | Ht 62.0 in | Wt 127.0 lb

## 2023-11-12 DIAGNOSIS — I2510 Atherosclerotic heart disease of native coronary artery without angina pectoris: Secondary | ICD-10-CM | POA: Insufficient documentation

## 2023-11-12 DIAGNOSIS — E782 Mixed hyperlipidemia: Secondary | ICD-10-CM | POA: Diagnosis present

## 2023-11-12 DIAGNOSIS — I050 Rheumatic mitral stenosis: Secondary | ICD-10-CM | POA: Insufficient documentation

## 2023-11-12 DIAGNOSIS — F341 Dysthymic disorder: Secondary | ICD-10-CM

## 2023-11-12 DIAGNOSIS — F329 Major depressive disorder, single episode, unspecified: Secondary | ICD-10-CM | POA: Insufficient documentation

## 2023-11-12 DIAGNOSIS — I330 Acute and subacute infective endocarditis: Secondary | ICD-10-CM | POA: Diagnosis present

## 2023-11-12 DIAGNOSIS — R0602 Shortness of breath: Secondary | ICD-10-CM | POA: Diagnosis present

## 2023-11-12 DIAGNOSIS — R0609 Other forms of dyspnea: Secondary | ICD-10-CM | POA: Insufficient documentation

## 2023-11-12 DIAGNOSIS — I351 Nonrheumatic aortic (valve) insufficiency: Secondary | ICD-10-CM | POA: Insufficient documentation

## 2023-11-12 DIAGNOSIS — I10 Essential (primary) hypertension: Secondary | ICD-10-CM

## 2023-11-12 DIAGNOSIS — D509 Iron deficiency anemia, unspecified: Secondary | ICD-10-CM

## 2023-11-12 NOTE — Progress Notes (Signed)
 Cardiology Office Note:   Date:  11/12/2023  ID:  Frances Mendoza, DOB 03-29-1935, MRN [id number] PCP: Sirivol, Mamatha, MD  [facility] Providers Cardiologist:  Georganna Archer, MD { Chief Complaint:  Chief Complaint  Patient presents with   Shortness of Breath      History of Present Illness:   Frances Mendoza is a 88 y.o. female with a PMH of MV IE c/b septic emboli resulting in ACS and CVA (2012), CVA (2014), MS/MR, nonobstructive CAD, TAA, HTN and HLD who presents for follow up.  Its my first time meeting Frances Mendoza today.  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  She had a prolonged ICU stay as she suffered multiple embolic CVAs as well as an NSTEMI favored to be cardioembolic in etiology as well.  She was ultimately treated with 6 weeks of IV antibiotics and has follow-up with Dr. Alveta previously.  The patient shares with me today that she feels very fatigued, SOB, and depressed.  She has felt this way for the past several weeks to months.  Her husband died 5 years ago and she states that she has been progressively more depressed since that time.  She reports having dyspnea on exertion while walking to the mailbox and some dizziness upon standing.  She denies chest pain, syncope, fevers, chills, PND, orthopnea and swelling.  Her PCP identified anemia and the patient she was started on iron supplementation.  She endorses some hemorrhoidal bleeding that is present when she wipes.  The patient endorses feeling depressed.  She endorses decreased appetite with reduced p.o. intake.  She denies SI/HI.  She does not want to see psychiatry.  She has no further complaints.   Past Medical History:  Diagnosis Date   Allergy    Anxiety    Arthritis    osteoarthritis   Back problem    CAD (coronary artery disease)    a. 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 upper doesn't fit     Studies Reviewed:    EKG:  EKG Interpretation Date/Time:  Tuesday November 12 2023 15:53:48 EDT Ventricular Rate:  77 PR Interval:  218 QRS Duration:  106 QT Interval:  430 QTC Calculation: 486 R Axis:   -32  Text Interpretation: Sinus rhythm with 1st degree A-V block Left axis deviation Left ventricular hypertrophy with repolarization abnormality ( R in aVL , Sokolow-Lyon , Cornell product ) Inferior infarct (cited on or before 23-May-2012) Anteroseptal infarct (cited on or before 24-May-2012) Left atrial enlargement When compared with ECG of 26-Nov-2022 11:04, No significant change was found Confirmed by Archer Georganna [phone]) on 11/12/2023 4:20:05 PM     Cardiac Studies & Procedures   ______________________________________________________________________________________________     ECHOCARDIOGRAM  ECHOCARDIOGRAM COMPLETE 12/12/2022  Narrative ECHOCARDIOGRAM REPORT    Patient Name:   Frances Mendoza Date of Exam: 12/12/2022 Medical Rec #:  [id number]     Height:  62.0 in Accession #:    [id number]    Weight:       132.0 lb Date of Birth:  09/20/1935     BSA:          1.602 m Patient Age:    87 years      BP:           144/87 mmHg Patient Gender: F             HR:           73 bpm. Exam Location:  [address]  Procedure: 2D Echo, Cardiac Doppler and Color Doppler  Indications:    I05.9 Mitral Valve disorder  History:        Patient has prior history of Echocardiogram examinations, most recent 06/28/2020. CAD, Stroke; Risk Factors:Hypertension and HLD. Hx of  endocarditis.  Sonographer:    Waldo Guadalajara RCS Referring Phys: [phone] PHILIP J NAHSER  IMPRESSIONS   1. Left ventricular ejection fraction, by estimation, is >75%. Left ventricular ejection fraction by PLAX is 79 %. The left ventricle has hyperdynamic function. The left ventricle has no regional wall motion abnormalities. There is mild left ventricular hypertrophy. Left ventricular diastolic parameters are consistent with Grade I diastolic dysfunction (impaired relaxation). Elevated left ventricular end-diastolic pressure. The E/e' is 26. 2. Right ventricular systolic function is hyperdynamic. The right ventricular size is normal. There is normal pulmonary artery systolic pressure. The estimated right ventricular systolic pressure is 35.3 mmHg. 3. Left atrial size was severely dilated. 4. High abascal/wilkins score, not likely amenable to percutaneous valvotomy. The mitral valve is abnormal. Trivial mitral valve regurgitation. Severe mitral stenosis. The mean mitral valve gradient is 14.0 mmHg with average heart rate of 77 bpm. Severe mitral annular calcification. 5. Tricuspid valve regurgitation is mild to moderate. 6. The aortic valve is tricuspid. Aortic valve regurgitation is mild. Aortic valve mean gradient measures 12.0 mmHg. 7. Aortic dilatation noted. There is mild dilatation of the ascending aorta, measuring 41 mm. 8. The inferior vena cava is normal in size with greater than 50% respiratory variability, suggesting right atrial pressure of 3 mmHg.  Comparison(s): Changes from prior study are noted. 06/28/2020: LVEF 70-75%, moderate to severe MS.  Conclusion(s)/Recommendation(s): Consider further evaluation of severe MS with TEE to determine possible treatment options.  FINDINGS Left Ventricle: Left ventricular ejection fraction, by estimation, is >75%. Left ventricular ejection fraction by PLAX is 79 %. The left ventricle has hyperdynamic function. The left ventricle has no regional  wall motion abnormalities. The left ventricular internal cavity size was normal in size. There is mild left ventricular hypertrophy. Left ventricular diastolic parameters are consistent with Grade I diastolic dysfunction (impaired relaxation). Elevated left ventricular end-diastolic pressure. The E/e' is 33.  Right Ventricle: The right ventricular size is normal. No increase in right ventricular wall thickness. Right ventricular systolic function is hyperdynamic. There is normal pulmonary artery systolic pressure. The tricuspid regurgitant velocity is 2.84 m/s, and with an assumed right atrial pressure of 3 mmHg, the estimated right ventricular systolic pressure is 35.3 mmHg.  Left Atrium: Left atrial size was severely dilated.  Right Atrium: Right atrial size was normal in size.  Pericardium: There is no evidence of pericardial effusion.  Mitral Valve: High abascal/wilkins score, not likely amenable to percutaneous valvotomy. The mitral valve is abnormal. There is severe thickening of the mitral valve leaflet(s). Severe mitral annular calcification. Trivial mitral valve regurgitation. Severe mitral valve stenosis. MV peak gradient, 24.9 mmHg. The mean mitral valve  gradient is 14.0 mmHg with average heart rate of 77 bpm.  Tricuspid Valve: The tricuspid valve is grossly normal. Tricuspid valve regurgitation is mild to moderate.  Aortic Valve: The aortic valve is tricuspid. Aortic valve regurgitation is mild. Aortic regurgitation PHT measures 674 msec. Aortic valve mean gradient measures 12.0 mmHg. Aortic valve peak gradient measures 21.7 mmHg. Aortic valve area, by VTI measures 2.34 cm.  Pulmonic Valve: The pulmonic valve was grossly normal. Pulmonic valve regurgitation is trivial.  Aorta: Aortic dilatation noted. There is mild dilatation of the ascending aorta, measuring 41 mm.  Venous: The inferior vena cava is normal in size with greater than 50% respiratory variability, suggesting right  atrial pressure of 3 mmHg.  IAS/Shunts: No atrial level shunt detected by color flow Doppler.   LEFT VENTRICLE PLAX 2D LV EF:         Left            Diastology ventricular     LV e' medial:    7.07 cm/s ejection        LV E/e' medial:  29.0 fraction by     LV e' lateral:   8.70 cm/s PLAX is 79      LV E/e' lateral: 23.6 %. LVIDd:         3.80 cm LVIDs:         2.00 cm LV PW:         1.50 cm LV IVS:        1.20 cm LVOT diam:     1.90 cm LV SV:         109 LV SV Index:   68 LVOT Area:     2.84 cm   RIGHT VENTRICLE RV Basal diam:  2.80 cm RV S prime:     18.60 cm/s TAPSE (M-mode): 2.6 cm RVSP:           35.3 mmHg  LEFT ATRIUM            Index        RIGHT ATRIUM           Index LA diam:      4.30 cm  2.68 cm/m   RA Pressure: 3.00 mmHg LA Vol (A4C): 118.6 ml 74.00 ml/m  RA Area:     13.10 cm RA Volume:   28.50 ml  17.79 ml/m AORTIC VALVE AV Area (Vmax):    2.09 cm AV Area (Vmean):   2.28 cm AV Area (VTI):     2.34 cm AV Vmax:           233.00 cm/s AV Vmean:          157.000 cm/s AV VTI:            0.465 m AV Peak Grad:      21.7 mmHg AV Mean Grad:      12.0 mmHg LVOT Vmax:         172.00 cm/s LVOT Vmean:        126.000 cm/s LVOT VTI:          0.384 m LVOT/AV VTI ratio: 0.83 AI PHT:            674 msec  AORTA Ao Root diam: 3.20 cm Ao Asc diam:  4.10 cm  MITRAL VALVE                TRICUSPID VALVE MV Area (PHT): 3.89 cm     TR Peak grad:   32.3 mmHg MV Area  VTI:   1.44 cm     TR Vmax:        284.00 cm/s MV Peak grad:  24.9 mmHg    Estimated RAP:  3.00 mmHg MV Mean grad:  14.0 mmHg    RVSP:           35.3 mmHg MV Vmax:       2.50 m/s MV Vmean:      161.0 cm/s   SHUNTS MV Decel Time: 195 msec     Systemic VTI:  0.38 m MV E velocity: 205.00 cm/s  Systemic Diam: 1.90 cm MV A velocity: 203.00 cm/s MV E/A ratio:  1.01  [name] Maxcy MD Electronically signed by [name] Maxcy MD Signature Date/Time: 12/12/2022/4:34:10 PM    Final           ______________________________________________________________________________________________      Risk Assessment/Calculations:              Physical Exam:     VS:  BP (!) 100/46 (BP Location: Right Arm, Patient Position: Sitting, Cuff Size: Normal)   Pulse 77   Ht 5' 2 (1.575 m)   Wt 127 lb (57.6 kg)   SpO2 91%   BMI 23.23 kg/m      Wt Readings from Last 3 Encounters:  10/08/23 127 lb 6.4 oz (57.8 kg)  02/26/23 133 lb 9.6 oz (60.6 kg)  11/26/22 132 lb (59.9 kg)     GEN: Thin elderly appearing female in NAD NECK: No JVD; No carotid bruits CARDIAC: RRR, III/VI systolic murmur loudest at the apex; II/VI diastolic murmur at both the RUSB and apex, no rubs or gallops RESPIRATORY: Mild bibasilar rales, no wheezes or rhonchi ABDOMEN: Soft, non-tender, non-distended, normal bowel sounds EXTREMITIES:  Warm and well perfused, no edema; No deformity, 2+ radial pulses PSYCH: Flat affect with depressed mood   Assessment & Plan Severe mitral valve stenosis The patient's echocardiogram from 1 year ago demonstrates severe MS with at least mild MR.  Unfortunately given the patient's age, frailty, and comorbidities, she would be a high surgical risk for mitral valve intervention.  Additionally, with her calcified her valve is, I suspect that she would not even be a candidate for PMBC. I had an extensive conversation with the patient and her youngest daughter who accompanied her today about treatment options.  I offered performing a TEE to better assess the severity of her MR and the morphology of her mitral valve in order to definitively rule in or out PMBC versus focusing more on QOL.  The patient would rather focus on QOL at this time rather than invasive procedures.  Given her age as well as her GOC, I think this is completely reasonable.  I also explained to the patient that I do not feel that performing serial echocardiograms are indicated since she is not wanting to do invasive  procedures or take additional medications (unless absolutely necessary). One thing I will consider next visit is possibly adding on a beta-blocker to increase her diastolic filling time if her symptoms do not improve, but I would worry about this also making her more fatigued. -Focusing on QOL and symptom management for now -I believe that her SOB is related to her MS but I did hear crackles in the bases so we will get a CXR as described below -If she changes her mind about undergoing a TEE in the future then this can be considered -Follow-up in 6 months  Primary hypertension Blood pressure is at goal today.  No changes.  Dyspnea on exertion Has worsening DOE that I suspect is related to her underlying valvular pathology.  I heard some crackles in her lungs but she did not appear volume overloaded.  I will order a chest x-ray to assess and decide on if she needs any diuretic to help with symptom management.  Another contributing factor is her anemia that may be causing DOE as well. -CXR  Infective endocarditis, due to unspecified organism, unspecified chronicity She had a prior IE infection so will need antibiotic prophylaxis before any dental cleaning indefinitely. -ABX prior to dental cleaning  Mixed hyperlipidemia The patient is not interested in starting any cholesterol-lowering therapies and I do not think that is within the patient's GOC anyway.  I will defer lipid panel testing for this reason.  Iron deficiency anemia, unspecified iron deficiency anemia type Noted to be iron deficient and I think this contributes to her fatigue and SOB.  1 possibility for anemia is hemolysis from her mitral valve but it is certainly multifactorial.  I agree with continuing iron supplementation per her PCP. Persistent depressive disorder The patient endorses feeling depressed and looks visibly depressed.  She denies SI/HI.  I offered to refer the patient to a psychiatrist for management, but she  declined.  The offer is on the table if she changes her mind.  Defer additional management to PCP. Coronary artery disease involving native coronary artery of native heart without angina pectoris Ideally would be taking an aspirin  and statin; however, given her hemorrhoidal bleeding and anemia I recommend holding a baby aspirin  for now.  She is not interested in cholesterol-lowering therapy at this time.  I think the best thing for this patient is to continue focusing on GOC and QOL.       This note was written with the assistance of a dictation microphone or AI dictation software. Please excuse any typos or grammatical errors.   Signed, Georganna Archer, MD 11/12/2023 10:59 AM    [facility]

## 2023-11-12 NOTE — Assessment & Plan Note (Signed)
 The patient's echocardiogram from 1 year ago demonstrates severe MS with at least mild MR.  Unfortunately given the patient's age, frailty, and comorbidities, she would be a high surgical risk for mitral valve intervention.  Additionally, with her calcified her valve is, I suspect that she would not even be a candidate for PMBC. I had an extensive conversation with the patient and her youngest daughter who accompanied her today about treatment options.  I offered performing a TEE to better assess the severity of her MR and the morphology of her mitral valve in order to definitively rule in or out PMBC versus focusing more on QOL.  The patient would rather focus on QOL at this time rather than invasive procedures.  Given her age as well as her GOC, I think this is completely reasonable.  I also explained to the patient that I do not feel that performing serial echocardiograms are indicated since she is not wanting to do invasive procedures or take additional medications (unless absolutely necessary). One thing I will consider next visit is possibly adding on a beta-blocker to increase her diastolic filling time if her symptoms do not improve, but I would worry about this also making her more fatigued. -Focusing on QOL and symptom management for now -I believe that her SOB is related to her MS but I did hear crackles in the bases so we will get a CXR as described below -If she changes her mind about undergoing a TEE in the future then this can be considered -Follow-up in 6 months

## 2023-11-12 NOTE — Assessment & Plan Note (Signed)
 The patient is not interested in starting any cholesterol-lowering therapies and I do not think that is within the patient's GOC anyway.  I will defer lipid panel testing for this reason.

## 2023-11-12 NOTE — Patient Instructions (Signed)
 Testing/Procedures: Chest x-ray  A chest x-ray takes a picture of the organs and structures inside the chest, including the heart, lungs, and blood vessels. This test can show several things, including, whether the heart is enlarges; whether fluid is building up in the lungs; and whether pacemaker / defibrillator leads are still in place.   Follow-Up: At [facility] you and your health needs are our priority.  As part of our continuing mission to provide you with exceptional heart care, our providers are all part of one team.  This team includes your primary Cardiologist (physician) and Advanced Practice Providers or APPs (Physician Assistants and Nurse Practitioners) who all work together to provide you with the care you need, when you need it.  Your next appointment:   6 month(s)  Provider:   Georganna Archer, MD

## 2023-11-12 NOTE — Assessment & Plan Note (Signed)
 Noted to be iron deficient and I think this contributes to her fatigue and SOB.  1 possibility for anemia is hemolysis from her mitral valve but it is certainly multifactorial.  I agree with continuing iron supplementation per her PCP.

## 2023-11-12 NOTE — Assessment & Plan Note (Signed)
Blood pressure is at goal today. No changes

## 2023-11-12 NOTE — Assessment & Plan Note (Signed)
 Ideally would be taking an aspirin  and statin; however, given her hemorrhoidal bleeding and anemia I recommend holding a baby aspirin  for now.  She is not interested in cholesterol-lowering therapy at this time.  I think the best thing for this patient is to continue focusing on GOC and QOL.

## 2023-11-12 NOTE — Assessment & Plan Note (Signed)
 She had a prior IE infection so will need antibiotic prophylaxis before any dental cleaning indefinitely. -ABX prior to dental cleaning

## 2023-11-17 ENCOUNTER — Other Ambulatory Visit: Payer: Self-pay | Admitting: Adult Health

## 2023-11-18 ENCOUNTER — Ambulatory Visit: Payer: Self-pay | Admitting: Student in an Organized Health Care Education/Training Program

## 2023-12-01 ENCOUNTER — Other Ambulatory Visit: Payer: Self-pay | Admitting: Adult Health

## 2023-12-04 NOTE — Telephone Encounter (Signed)
 Last filled by patient on 09/06/23 Last office visit : 10/08/23 Next office visit : 09/21/24 - Namenda  10 mg twice a day

## 2023-12-11 ENCOUNTER — Other Ambulatory Visit: Payer: Self-pay | Admitting: Gastroenterology

## 2023-12-11 DIAGNOSIS — E611 Iron deficiency: Secondary | ICD-10-CM

## 2023-12-11 DIAGNOSIS — R634 Abnormal weight loss: Secondary | ICD-10-CM

## 2023-12-12 ENCOUNTER — Telehealth: Payer: Self-pay

## 2023-12-12 MED ORDER — PREDNISONE 50 MG PO TABS: ORAL_TABLET | ORAL | 0 refills | Status: AC

## 2023-12-12 NOTE — Progress Notes (Signed)
 Spoke with pt's daughter, [name], informed of when to take prednisone and benadryl , see below  Pt to take 50 mg of prednisone on 12-23-23 at 1220 am , 50 mg of prednisone on 12-23-23 at 620 am, and 50 mg of prednisone on 12-23-23 at 1220 pm. Pt is also to take 50 mg of benadryl  on 12-23-23 at 1220 pm. Please call [phone] with any questions.            Med e-scribed

## 2023-12-23 ENCOUNTER — Other Ambulatory Visit

## 2023-12-23 ENCOUNTER — Ambulatory Visit
Admission: RE | Admit: 2023-12-23 | Discharge: 2023-12-23 | Disposition: A | Source: Ambulatory Visit | Attending: Gastroenterology | Admitting: Gastroenterology

## 2023-12-23 DIAGNOSIS — E611 Iron deficiency: Secondary | ICD-10-CM

## 2023-12-23 DIAGNOSIS — R634 Abnormal weight loss: Secondary | ICD-10-CM

## 2023-12-23 MED ORDER — IOPAMIDOL (ISOVUE-300) INJECTION 61%
100.0000 mL | Freq: Once | INTRAVENOUS | Status: AC | PRN
  Administered 2023-12-23: 100 mL via INTRAVENOUS

## 2024-01-23 DIAGNOSIS — I361 Nonrheumatic tricuspid (valve) insufficiency: Secondary | ICD-10-CM

## 2024-01-23 DIAGNOSIS — I340 Nonrheumatic mitral (valve) insufficiency: Secondary | ICD-10-CM

## 2024-01-23 DIAGNOSIS — I517 Cardiomegaly: Secondary | ICD-10-CM

## 2024-01-23 DIAGNOSIS — I352 Nonrheumatic aortic (valve) stenosis with insufficiency: Secondary | ICD-10-CM

## 2024-01-29 ENCOUNTER — Telehealth: Payer: Self-pay

## 2024-01-29 NOTE — Transitions of Care (Post Inpatient/ED Visit) (Signed)
° °  01/29/2024  Name: Frances Mendoza MRN: [id number] DOB: 14-Apr-1935  Today's TOC FU Call Status: Today's TOC FU Call Status:: Unsuccessful Call (1st Attempt) Unsuccessful Call (1st Attempt) Date: 01/29/24  Attempted to reach the patient regarding the most recent Inpatient/ED visit.  Follow Up Plan: No further outreach attempts will be made at this time. We have been unable to contact the patient. Patient in rehab [facility] Julian Lemmings, LPN [facility] Nurse Health Advisor Direct Dial [phone]

## 2024-02-18 ENCOUNTER — Telehealth: Payer: Self-pay

## 2024-02-18 NOTE — Telephone Encounter (Signed)
 [name] called for earlier appointment than 1/8. Pt is having drainage at surgery incision site. Denies fever, pain, chills. Frances Mendoza to reach out to surgeon for appointment/ advise on what she should do for this. No earlier appointments available this week.  Lorenda CHRISTELLA Code, RMA

## 2024-02-27 ENCOUNTER — Encounter: Payer: Self-pay | Admitting: Internal Medicine

## 2024-02-27 ENCOUNTER — Ambulatory Visit (INDEPENDENT_AMBULATORY_CARE_PROVIDER_SITE_OTHER): Payer: Self-pay | Admitting: Internal Medicine

## 2024-02-27 ENCOUNTER — Other Ambulatory Visit: Payer: Self-pay

## 2024-02-27 VITALS — BP 120/62 | HR 90 | Temp 98.2°F

## 2024-02-27 DIAGNOSIS — L0231 Cutaneous abscess of buttock: Secondary | ICD-10-CM | POA: Diagnosis not present

## 2024-02-27 DIAGNOSIS — L0291 Cutaneous abscess, unspecified: Secondary | ICD-10-CM

## 2024-02-27 MED ORDER — LINEZOLID 600 MG PO TABS: 600.0000 mg | ORAL_TABLET | Freq: Two times a day (BID) | ORAL | 0 refills | Status: AC

## 2024-02-27 MED ORDER — LINEZOLID 600 MG PO TABS: 600.0000 mg | ORAL_TABLET | Freq: Two times a day (BID) | ORAL | 0 refills | Status: DC

## 2024-02-27 NOTE — Progress Notes (Signed)
 "     Patient: Frances Mendoza  DOB: [birth year] MRN: [id number] PCP: Ivin Snuffer, MD   Patient Active Problem List   Diagnosis Date Noted   Major depressive disorder 11/12/2023   Severe mitral valve stenosis 11/12/2023   Aortic insufficiency 11/12/2023   Anxiety and depression 10/08/2023   Iron deficiency anemia 10/08/2023   Chronic hip pain, bilateral 02/26/2023   Chronic right-sided low back pain without sciatica 02/26/2023   Leukocytosis 11/12/2022   Encounter for immunization 11/12/2022   Cognitive impairment 06/28/2020   Hyperlipidemia 09/23/2017   History of CVA (cerebrovascular accident) 03/20/2016   Endocarditis 11/28/2015   Encounter for immunization 11/28/2015   Bacterial endocarditis 10/18/2014   Memory loss 07/31/2012   Other and unspecified hyperlipidemia 07/15/2012   Acute encephalopathy 07/12/2012   Hypertension 07/12/2012   Cellulitis 11/12/2011   Oral lesion 11/12/2011   Drug rash 11/29/2010   Itching 11/29/2010   Yeast infection involving the vagina and surrounding area 11/29/2010   Drug rash 11/29/2010   Hypokalemia 11/29/2010   Renal insufficiency 11/29/2010   Cerebral infarction (HCC) 11/21/2010   Mitral regurgitation 10/18/2010   Coronary artery disease 10/18/2010   SIRS (systemic inflammatory response syndrome) (HCC) 10/18/2010   Dysphagia 10/18/2010     Subjective:  Frances Mendoza is a 89 y.o. female with past medical history of anxiety/depression, culture-negative native mitral valve endocarditis treated with vancomycin  and gentamicin  as well as Cipro x 1 month then discharged on ciprofloxacin despite  diffuse rash in 2013, hypertension, CAD, CVA, vertigo presents for management of gluteal abscess.  She is was referred by [facility] health Dr. Calone last seen on 02/06/2024.  Patient has longstanding history of right buttock pain increased over the last several days prior to hospitalization.  She underwent aspiration with cultures growing MSSA.   Taken to the OR for debridement on 01/23/24.  Found evidence of early seroma versus bursa with evidence of chronic discharge on gluteus medius and maximus.  OR cultures grew MSSA patient discharged on daptomycin x 6 weeks.  At postop visit on 12/18 with Dr. Renato noted that right hip surgical incision healing well clean and dry.  Went ahead and remove sutures. Patient was hospitalized for above procedure 12/2 - 12/9 at [facility]  MRI right hip without contrast during admission showed ill-defined septated abscess measuring 4X7X 9 cm in the right gluteal muscle with significant adjacent soft tissue edema.  CT chest shows subsegmental atelectasis. Today: Doing well overall. Family notes she has had a mild cough, cxr done at snf. No fevers or chills. They note her wound opened after 12/18, some drainage but non -purulent  Review of Systems  All other systems reviewed and are negative.   Past Medical History:  Diagnosis Date   Allergy    Anxiety    Arthritis    osteoarthritis   Back problem    CAD (coronary artery disease)    a. 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  upper doesn't fit    Outpatient Medications Prior to Visit  Medication Sig Dispense Refill   acetaminophen  (TYLENOL ) 500 MG tablet Take 500 mg by mouth every 6 (six) hours as needed.     Ascorbic Acid  (VITAMIN C  PO) Take 50 mg by mouth daily.      Aspirin -Acetaminophen  (GOODYS BACK & BODY PAIN PO) Take 1 Package by mouth 3 (three) times daily as needed.     diazepam  (VALIUM ) 5 MG tablet  Take 5 mg by mouth every 8 (eight) hours as needed for anxiety. 3 daily     donepezil  (ARICEPT ) 10 MG tablet TAKE 1 TABLET BY MOUTH EVERY DAY 90 tablet 3   meclizine  (ANTIVERT ) 25 MG tablet Take 25 mg by mouth 3 (three) times daily as needed.     memantine  (NAMENDA ) 10 MG tablet TAKE 1 TABLET BY MOUTH TWICE A DAY 180 tablet 3   nitroGLYCERIN  (NITROSTAT ) 0.4 MG SL tablet Place 1 tablet (0.4 mg total) under the tongue every 5 (five) minutes x 3 doses as needed for chest pain. 25 tablet 11   potassium chloride  (MICRO-K ) 10 MEQ CR capsule TAKE 1 CAPSULE BY MOUTH EVERY DAY 90 capsule 1   predniSONE  (DELTASONE ) 50 MG tablet Pt to take 50 mg of prednisone  on 12-23-23 at 1220 am , 50 mg of prednisone  on 12-23-23 at 620 am, and 50 mg of prednisone  on 12-23-23 at 1220 pm. Pt is also to take 50 mg of benadryl  on 12-23-23 at 1220 pm. Please call [phone] with any questions. 3 tablet 0   sertraline  (ZOLOFT ) 100 MG tablet Take 1 tablet by mouth daily.     No facility-administered medications prior to visit.     Allergies[1]  Social History[2]  Family History  Problem Relation Age of Onset   Coronary artery disease Unknown        family history of   Colon cancer Unknown        family history of   Cerebral palsy Sister    Colon cancer Brother    Prostate cancer Brother     Objective:  There were no vitals filed for this visit. There is no height or weight on file to calculate BMI.  Physical Exam Constitutional:      Appearance: Normal appearance.  HENT:     Head: Normocephalic and atraumatic.     Right Ear: Tympanic membrane normal.     Left Ear: Tympanic membrane normal.     Nose: Nose normal.     Mouth/Throat:     Mouth: Mucous membranes are moist.  Eyes:     Extraocular Movements: Extraocular movements intact.     Conjunctiva/sclera: Conjunctivae normal.     Pupils: Pupils are equal, round, and reactive to light.  Cardiovascular:     Rate and Rhythm: Normal rate and regular rhythm.      Heart sounds: No murmur heard.    No friction rub. No gallop.  Pulmonary:     Effort: Pulmonary effort is normal.  Abdominal:     General: Abdomen is flat.     Palpations: Abdomen is soft.  Skin:    General: Skin is warm and dry.  Neurological:     General: No focal deficit present.     Mental Status: She is alert and oriented to person, place, and time.  Psychiatric:        Mood and Affect: Mood normal.        Lab Results: Lab Results  Component Value Date  WBC 7.7 10/08/2023   HGB 9.6 (L) 10/08/2023   HCT 32.6 (L) 10/08/2023   MCV 88 10/08/2023   PLT 222 10/08/2023    Lab Results  Component Value Date   CREATININE 0.70 07/14/2012   BUN 12 07/14/2012   NA 138 07/14/2012   K 3.4 (L) 07/14/2012   CL 98 07/14/2012   CO2 31 07/14/2012    Lab Results  Component Value Date   ALT 12 07/12/2012   AST 19 07/12/2012   ALKPHOS 74 07/12/2012   BILITOT 0.3 07/12/2012     Assessment & Plan:  #Right gluteal abscess status post I&D on 12//25 with cultures growing MSSA - I reviewed admission and Ortho follow-up note - Patient noted to have chronic hip pain that was worsening when she got out of chair and could not walk back night prior to hospitalization 12 3-12 9.  Today she was afebrile on admission, WBC 11.3 K MRI right hip showed 4X7X 9 cm right gluteal muscle abscess adjacent soft tissue edema.  CT chest showed subsegmental send abdominal atelectasis. - Patient underwent aspiration fluid collection and then I&D on 12//23 by Dr. Renato.  Or findings were notable for evidence of early infected seroma versus burst with evidence of bony destruction gluteus medius and maximus. -She had PICC line placed and discharged on daptomycin x 6 weeks - On 02/06/2024 she was seen by Dr. Renato at [facility] left postop visit.  Noted that right hip surgical incision healing well clean and dry.  Went ahead and remove sutures.   referred to infectious disease in [place] at that  point. - I reviewed sensitivities(mssa/oxacillin sensitive).  Will need last set of labs once labs stable then we can pull PICC and transition cefadroxil. Plan: -Requested labs and cxr(cough is mild/no fver, we ordered our on set -Pull picc pt completed about 5 weeks and stop daptomycin,  start cefadroxil x 2 weeks eot 03/12/24. Wound was packed but no concern for infection at surrounding tissue -f/u with ID on 03/17/23 -Continue to follow with Ortho   Loney Stank, MD [facility]   02/27/2024  8:59 AM I personally spent a total of 179 minutes in the care of the patient today including preparing to see the patient, getting/reviewing separately obtained history, performing a medically appropriate exam/evaluation, counseling and educating, placing orders, documenting clinical information in the EHR, independently interpreting results, and communicating results.     [1]  Allergies Allergen Reactions   Amoxicillin Anaphylaxis   Penicillins Anaphylaxis   Tolectin [Tolmetin Sodium] Anaphylaxis    Took at same time as amoxicillin when she had anaphylactic reaction.   Tolmetin Anaphylaxis   Ciprofloxacin Rash    Entire body looked as if sunburned, then skin peeled off.   Statins Other (See Comments)    Leg pain   Ioxaglate Hives    Patient had severe hives after Myelogram.  No issues breathing.  Returned for  a nerve root injection, was given Benadryl , mentioned some minor itching to daughter on the way home but no further issues. Was given a Benadryl  today for this injection.    Other Other (See Comments)   Prolactin    Remeron [Mirtazapine] Other (See Comments)    confusion   Bactrim [Sulfamethoxazole-Trimethoprim] Rash    ? Early rash on palms   Codeine Nausea And Vomiting   Contrast Media [Iodinated Contrast Media] Hives    Patient had severe hives after Myelogram.  No issues breathing.  Returned for  a nerve root injection, was  given Benadryl , mentioned some minor itching to daughter on the way home but no further issues. Was given a Benadryl  today for this injection.    Imipenem Rash   Megestrol Rash   Procardia [Nifedipine] Other (See Comments)    Causes aches down legs   Vancomycin  Rash  [2]  Social History Tobacco Use   Smoking status: Never   Smokeless tobacco: Never  Substance Use Topics   Alcohol use: No   Drug use: No   "

## 2024-02-27 NOTE — Patient Instructions (Addendum)
 Plan: Requested labs, we ordered as well -Pull picc pt completed about 5 weeks,  will start cefadroxil x 2 weeks -Please send CXR -f/u with ID on 03/17/23 -Continue to follow with Ortho

## 2024-02-28 ENCOUNTER — Telehealth: Payer: Self-pay

## 2024-02-28 ENCOUNTER — Ambulatory Visit: Payer: Self-pay | Admitting: Student in an Organized Health Care Education/Training Program

## 2024-02-28 LAB — CBC WITH DIFFERENTIAL/PLATELET
Absolute Lymphocytes: 1608 {cells}/uL (ref 850–3900)
Absolute Monocytes: 690 {cells}/uL (ref 200–950)
Basophils Absolute: 28 {cells}/uL (ref 0–200)
Basophils Relative: 0.4 %
Eosinophils Absolute: 152 {cells}/uL (ref 15–500)
Eosinophils Relative: 2.2 %
HCT: 34.7 % — ABNORMAL LOW (ref 35.9–46.0)
Hemoglobin: 10.7 g/dL — ABNORMAL LOW (ref 11.7–15.5)
MCH: 26.2 pg — ABNORMAL LOW (ref 27.0–33.0)
MCHC: 30.8 g/dL — ABNORMAL LOW (ref 31.6–35.4)
MCV: 85 fL (ref 81.4–101.7)
MPV: 12.8 fL — ABNORMAL HIGH (ref 7.5–12.5)
Monocytes Relative: 10 %
Neutro Abs: 4423 {cells}/uL (ref 1500–7800)
Neutrophils Relative %: 64.1 %
Platelets: 208 Thousand/uL (ref 140–400)
RBC: 4.08 Million/uL (ref 3.80–5.10)
RDW: 17.3 % — ABNORMAL HIGH (ref 11.0–15.0)
Total Lymphocyte: 23.3 %
WBC: 6.9 Thousand/uL (ref 3.8–10.8)

## 2024-02-28 LAB — COMPLETE METABOLIC PANEL WITHOUT GFR
AG Ratio: 1.7 (calc) (ref 1.0–2.5)
ALT: 5 U/L — ABNORMAL LOW (ref 6–29)
AST: 16 U/L (ref 10–35)
Albumin: 3.7 g/dL (ref 3.6–5.1)
Alkaline phosphatase (APISO): 96 U/L (ref 37–153)
BUN/Creatinine Ratio: 24 (calc) — ABNORMAL HIGH (ref 6–22)
BUN: 12 mg/dL (ref 7–25)
CO2: 27 mmol/L (ref 20–32)
Calcium: 9 mg/dL (ref 8.6–10.4)
Chloride: 102 mmol/L (ref 98–110)
Creat: 0.51 mg/dL — ABNORMAL LOW (ref 0.60–0.95)
Globulin: 2.2 g/dL (ref 1.9–3.7)
Glucose, Bld: 104 mg/dL — ABNORMAL HIGH (ref 65–99)
Potassium: 4.2 mmol/L (ref 3.5–5.3)
Sodium: 138 mmol/L (ref 135–146)
Total Bilirubin: 0.5 mg/dL (ref 0.2–1.2)
Total Protein: 5.9 g/dL — ABNORMAL LOW (ref 6.1–8.1)

## 2024-02-28 LAB — SEDIMENTATION RATE: Sed Rate: 45 mm/h — ABNORMAL HIGH (ref 0–30)

## 2024-02-28 LAB — C-REACTIVE PROTEIN: CRP: 59.2 mg/L — ABNORMAL HIGH

## 2024-02-28 LAB — CK: Total CK: 13 U/L — ABNORMAL LOW (ref 16–215)

## 2024-02-28 NOTE — Telephone Encounter (Signed)
 Faxed over office note to [facility] and received confirmation.

## 2024-03-01 ENCOUNTER — Ambulatory Visit: Payer: Self-pay | Admitting: Internal Medicine

## 2024-03-06 NOTE — Progress Notes (Signed)
 Spoke to daughter and advised. Agrees with follow up being moved to September 2026.

## 2024-03-16 ENCOUNTER — Ambulatory Visit: Admitting: Internal Medicine

## 2024-03-19 ENCOUNTER — Other Ambulatory Visit: Payer: Self-pay

## 2024-03-19 ENCOUNTER — Ambulatory Visit: Admitting: Internal Medicine

## 2024-03-19 VITALS — BP 125/74 | HR 85 | Temp 98.5°F | Resp 16

## 2024-03-19 DIAGNOSIS — L0291 Cutaneous abscess, unspecified: Secondary | ICD-10-CM

## 2024-03-19 NOTE — Patient Instructions (Addendum)
 Stop cefadroxil Referral to wound care F/u with ortho F/u in one month with ID. If wound erythema occurs please call clinic

## 2024-03-19 NOTE — Progress Notes (Unsigned)
 "     Patient: Frances Mendoza  DOB: [birth year] MRN: [id number] PCP: Sirivol, Mamatha, MD  Referring Provider: ***  No chief complaint on file.    Patient Active Problem List   Diagnosis Date Noted   Major depressive disorder 11/12/2023   Severe mitral valve stenosis 11/12/2023   Aortic insufficiency 11/12/2023   Anxiety and depression 10/08/2023   Iron deficiency anemia 10/08/2023   Chronic hip pain, bilateral 02/26/2023   Chronic right-sided low back pain without sciatica 02/26/2023   Leukocytosis 11/12/2022   Encounter for immunization 11/12/2022   Cognitive impairment 06/28/2020   Hyperlipidemia 09/23/2017   History of CVA (cerebrovascular accident) 03/20/2016   Endocarditis 11/28/2015   Encounter for immunization 11/28/2015   Bacterial endocarditis 10/18/2014   Memory loss 07/31/2012   Other and unspecified hyperlipidemia 07/15/2012   Acute encephalopathy 07/12/2012   Hypertension 07/12/2012   Cellulitis 11/12/2011   Oral lesion 11/12/2011   Drug rash 11/29/2010   Itching 11/29/2010   Yeast infection involving the vagina and surrounding area 11/29/2010   Drug rash 11/29/2010   Hypokalemia 11/29/2010   Renal insufficiency 11/29/2010   Cerebral infarction (HCC) 11/21/2010   Mitral regurgitation 10/18/2010   Coronary artery disease 10/18/2010   SIRS (systemic inflammatory response syndrome) (HCC) 10/18/2010   Dysphagia 10/18/2010     Subjective:  Frances Mendoza is a 89 y.o. female with past medical history of anxiety/depression, culture-negative native mitral valve endocarditis treated with vancomycin  and gentamicin  as well as Cipro x 1 month then discharged on ciprofloxacin despite  diffuse rash in 2013, hypertension, CAD, CVA, vertigo presents for management of gluteal abscess.  She is was referred by [facility] health Dr. Calone last seen on 02/06/2024.  Patient has longstanding history of right buttock pain increased over the last several days prior to hospitalization.   She underwent aspiration with cultures growing MSSA.  Taken to the OR for debridement on 01/23/24.  Found evidence of early seroma versus bursa with evidence of chronic discharge on gluteus medius and maximus.  OR cultures grew MSSA patient discharged on daptomycin x 6 weeks.  At postop visit on 12/18 with Dr. Renato noted that right hip surgical incision healing well clean and dry.  Went ahead and remove sutures. Patient was hospitalized for above procedure 12/2 - 12/9 at [facility]  MRI right hip without contrast during admission showed ill-defined septated abscess measuring 4X7X 9 cm in the right gluteal muscle with significant adjacent soft tissue edema.  CT chest shows subsegmental atelectasis. Today: Doing well overall. Family notes she has had a mild cough, cxr done at snf. No fevers or chills. They note her wound opened after 12/18, some drainage but non -purulent  ROS  Past Medical History:  Diagnosis Date   Allergy    Anxiety    Arthritis    osteoarthritis   Back problem    CAD (coronary artery disease)    a. 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  upper doesn't fit    Outpatient Medications Prior to Visit  Medication Sig Dispense Refill   acetaminophen  (TYLENOL ) 500 MG tablet Take 500 mg by mouth every 6 (six) hours as needed.     ACIDOPHILUS LACTOBACILLUS PO Take by mouth.     Ascorbic Acid  (VITAMIN C  PO) Take 50 mg by mouth daily.      Aspirin -Acetaminophen  (GOODYS BACK & BODY PAIN PO) Take 1 Package by mouth 3 (three) times daily as  needed. (Patient not taking: Reported on 02/27/2024)     cholecalciferol  (VITAMIN D3) 25 MCG (1000 UNIT) tablet Take 1,000 Units by mouth daily.     diazepam  (VALIUM ) 5 MG tablet Take 5 mg by mouth every 8 (eight) hours as needed for anxiety. 3 daily (Patient not taking: Reported on 02/27/2024)     donepezil  (ARICEPT ) 10 MG tablet TAKE 1 TABLET BY MOUTH EVERY DAY 90 tablet 3   meclizine  (ANTIVERT ) 25 MG tablet Take 25 mg by mouth 3 (three) times daily as needed.     memantine  (NAMENDA ) 10 MG tablet TAKE 1 TABLET BY MOUTH TWICE A DAY 180 tablet 3   nitroGLYCERIN  (NITROSTAT ) 0.4 MG SL tablet Place 1 tablet (0.4 mg total) under the tongue every 5 (five) minutes x 3 doses as needed for chest pain. 25 tablet 11   potassium chloride  (MICRO-K ) 10 MEQ CR capsule TAKE 1 CAPSULE BY MOUTH EVERY DAY 90 capsule 1   predniSONE  (DELTASONE ) 50 MG tablet Pt to take 50 mg of prednisone  on 12-23-23 at 1220 am , 50 mg of prednisone  on 12-23-23 at 620 am, and 50 mg of prednisone  on 12-23-23 at 1220 pm. Pt is also to take 50 mg of benadryl  on 12-23-23 at 1220 pm. Please call [phone] with any questions. (Patient not taking: Reported on 02/27/2024) 3 tablet 0   sertraline  (ZOLOFT ) 100 MG tablet Take 1 tablet by mouth daily.     No facility-administered medications prior to visit.     Allergies[1]  Social History[2]  Family History  Problem Relation Age of Onset   Coronary artery disease Unknown        family history of   Colon cancer Unknown        family history of   Cerebral palsy Sister    Colon cancer Brother    Prostate cancer Brother     Objective:  There were no vitals filed for this visit. There is no height or weight on file to calculate BMI.  Physical Exam  Lab Results: Lab Results  Component Value Date   WBC 6.9 02/27/2024   HGB 10.7 (L) 02/27/2024   HCT 34.7 (L) 02/27/2024   MCV 85.0 02/27/2024   PLT 208 02/27/2024    Lab Results  Component Value Date   CREATININE 0.51 (L) 02/27/2024    BUN 12 02/27/2024   NA 138 02/27/2024   K 4.2 02/27/2024   CL 102 02/27/2024   CO2 27 02/27/2024    Lab Results  Component Value Date   ALT 5 (L) 02/27/2024   AST 16 02/27/2024   ALKPHOS 74 07/12/2012   BILITOT 0.5 02/27/2024     Assessment & Plan:  #Right gluteal abscess status post I&D on 12//25 with cultures growing MSSA - I reviewed admission and Ortho follow-up note - Patient noted to have chronic hip pain that was worsening when she got out of chair and could not walk back night prior to hospitalization 12 3-12 9.  Today she was afebrile on admission,  WBC 11.3 K MRI right hip showed 4X7X 9 cm right gluteal muscle abscess adjacent soft tissue edema.  CT chest showed subsegmental send abdominal atelectasis. - Patient underwent aspiration fluid collection and then I&D on 12//23 by Dr. Renato.  Or findings were notable for evidence of early infected seroma versus burst with evidence of bony destruction gluteus medius and maximus. -She had PICC line placed and discharged on daptomycin x 6 weeks - On 02/06/2024 she was seen by Dr. Renato at [place] left postop visit.  Noted that right hip surgical incision healing well clean and dry.  Went ahead and remove sutures.   referred to infectious disease in [place] at that point. - I reviewed sensitivities(mssa/oxacillin sensitive).  Will need last set of labs once labs stable then we can pull PICC and transition cefadroxil. -Requested labs and cxr(cough is mild/no fver, we ordered our on set -Pull picc pt completed about 5 weeks and stop daptomycin,  start cefadroxil x 2 weeks eot 03/12/24. Wound was packed but no concern for infection at surrounding tissue yester -seen by ortho Tuesday ,noted wound looked good Cxt on 03/05/23 no acute abdmonarlity -stop cefadroxil -sound care at rehab -Continue to follow with Ortho  -f/u in  one month   Loney Stank, MD [facility]   03/19/24   3:31 PM     [1]  Allergies Allergen Reactions   Amoxicillin Anaphylaxis   Penicillins Anaphylaxis   Tolectin [Tolmetin Sodium] Anaphylaxis    Took at same time as amoxicillin when she had anaphylactic reaction.   Tolmetin Anaphylaxis   Ciprofloxacin Rash    Entire body looked as if sunburned, then skin peeled off.   Statins Other (See Comments)    Leg pain   Ioxaglate Hives    Patient had severe hives after Myelogram.  No issues breathing.  Returned for  a nerve root injection, was given Benadryl , mentioned some minor itching to daughter on the way home but no further issues. Was given a Benadryl  today for this injection.    Other Other (See Comments)   Prolactin    Remeron [Mirtazapine] Other (See Comments)    confusion   Bactrim [Sulfamethoxazole-Trimethoprim] Rash    ? Early rash on palms   Codeine Nausea And Vomiting   Contrast Media [Iodinated Contrast Media] Hives    Patient had severe hives after Myelogram.  No issues breathing.  Returned for  a nerve root injection, was given Benadryl , mentioned some minor itching to daughter on the way home but no further issues. Was given a Benadryl  today for this injection.    Imipenem Rash   Megestrol Rash   Procardia [Nifedipine] Other (See Comments)    Causes aches down legs   Vancomycin  Rash  [2]  Social History Tobacco Use   Smoking status: Never   Smokeless tobacco: Never  Substance Use Topics   Alcohol use: No   Drug use: No   "

## 2024-03-20 LAB — CBC WITH DIFFERENTIAL/PLATELET
Absolute Lymphocytes: 2761 {cells}/uL (ref 850–3900)
Absolute Monocytes: 1221 {cells}/uL — ABNORMAL HIGH (ref 200–950)
Basophils Absolute: 43 {cells}/uL (ref 0–200)
Basophils Relative: 0.5 %
Eosinophils Absolute: 206 {cells}/uL (ref 15–500)
Eosinophils Relative: 2.4 %
HCT: 29.4 % — ABNORMAL LOW (ref 35.9–46.0)
Hemoglobin: 9.1 g/dL — ABNORMAL LOW (ref 11.7–15.5)
MCH: 26.5 pg — ABNORMAL LOW (ref 27.0–33.0)
MCHC: 31 g/dL — ABNORMAL LOW (ref 31.6–35.4)
MCV: 85.5 fL (ref 81.4–101.7)
Monocytes Relative: 14.2 %
Neutro Abs: 4369 {cells}/uL (ref 1500–7800)
Neutrophils Relative %: 50.8 %
Platelets: 126 10*3/uL — ABNORMAL LOW (ref 140–400)
RBC: 3.44 Million/uL — ABNORMAL LOW (ref 3.80–5.10)
RDW: 17.1 % — ABNORMAL HIGH (ref 11.0–15.0)
Total Lymphocyte: 32.1 %
WBC: 8.6 10*3/uL (ref 3.8–10.8)

## 2024-03-20 LAB — COMPLETE METABOLIC PANEL WITHOUT GFR
AG Ratio: 2.1 (calc) (ref 1.0–2.5)
ALT: 8 U/L (ref 6–29)
AST: 15 U/L (ref 10–35)
Albumin: 4.2 g/dL (ref 3.6–5.1)
Alkaline phosphatase (APISO): 68 U/L (ref 37–153)
BUN: 18 mg/dL (ref 7–25)
CO2: 29 mmol/L (ref 20–32)
Calcium: 9.2 mg/dL (ref 8.6–10.4)
Chloride: 105 mmol/L (ref 98–110)
Creat: 0.64 mg/dL (ref 0.60–0.95)
Globulin: 2 g/dL (ref 1.9–3.7)
Glucose, Bld: 81 mg/dL (ref 65–99)
Potassium: 4 mmol/L (ref 3.5–5.3)
Sodium: 141 mmol/L (ref 135–146)
Total Bilirubin: 0.4 mg/dL (ref 0.2–1.2)
Total Protein: 6.2 g/dL (ref 6.1–8.1)

## 2024-03-20 LAB — C-REACTIVE PROTEIN: CRP: 4.6 mg/L

## 2024-03-20 LAB — SEDIMENTATION RATE: Sed Rate: 28 mm/h (ref 0–30)

## 2024-03-23 ENCOUNTER — Ambulatory Visit: Payer: Self-pay | Admitting: Internal Medicine

## 2024-04-09 ENCOUNTER — Ambulatory Visit: Admitting: Internal Medicine

## 2024-04-15 ENCOUNTER — Encounter (HOSPITAL_BASED_OUTPATIENT_CLINIC_OR_DEPARTMENT_OTHER): Admitting: Internal Medicine

## 2024-09-21 ENCOUNTER — Ambulatory Visit: Admitting: Neurology
